# Patient Record
Sex: Female | Born: 1970 | Race: White | Hispanic: No | Smoking: Never smoker
Health system: Southern US, Community
[De-identification: ages and names within clinical notes are randomized; demographics above are authoritative.]

## PROBLEM LIST (undated history)

## (undated) DIAGNOSIS — F419 Anxiety disorder, unspecified: Secondary | ICD-10-CM

## (undated) DIAGNOSIS — N920 Excessive and frequent menstruation with regular cycle: Secondary | ICD-10-CM

## (undated) DIAGNOSIS — E119 Type 2 diabetes mellitus without complications: Secondary | ICD-10-CM

## (undated) DIAGNOSIS — R42 Dizziness and giddiness: Secondary | ICD-10-CM

## (undated) DIAGNOSIS — N946 Dysmenorrhea, unspecified: Secondary | ICD-10-CM

## (undated) DIAGNOSIS — E039 Hypothyroidism, unspecified: Secondary | ICD-10-CM

## (undated) DIAGNOSIS — I10 Essential (primary) hypertension: Secondary | ICD-10-CM

## (undated) HISTORY — PX: MASTOID DEBRIDEMENT: SHX2002

## (undated) HISTORY — DX: Type 2 diabetes mellitus without complications: E11.9

## (undated) HISTORY — PX: WISDOM TOOTH EXTRACTION: SHX21

## (undated) HISTORY — DX: Morbid (severe) obesity due to excess calories: E66.01

## (undated) HISTORY — DX: Dizziness and giddiness: R42

## (undated) HISTORY — DX: Dysmenorrhea, unspecified: N94.6

## (undated) HISTORY — DX: Hypothyroidism, unspecified: E03.9

## (undated) HISTORY — DX: Essential (primary) hypertension: I10

## (undated) HISTORY — DX: Excessive and frequent menstruation with regular cycle: N92.0

## (undated) HISTORY — PX: ABDOMINAL HYSTERECTOMY: SHX81

## (undated) HISTORY — DX: Anxiety disorder, unspecified: F41.9

---
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
## 1999-07-18 HISTORY — PX: TUBAL LIGATION: SHX77

## 2010-08-24 HISTORY — PX: CRYOABLATION: SHX1415

## 2011-04-06 ENCOUNTER — Ambulatory Visit: Payer: Self-pay | Admitting: Obstetrics & Gynecology

## 2011-04-13 ENCOUNTER — Ambulatory Visit: Payer: Self-pay | Admitting: Obstetrics & Gynecology

## 2011-04-13 HISTORY — PX: OTHER SURGICAL HISTORY: SHX169

## 2011-04-13 HISTORY — PX: OVARIAN CYST REMOVAL: SHX89

## 2011-04-17 LAB — PATHOLOGY REPORT

## 2014-10-08 LAB — HM PAP SMEAR: HM PAP: NEGATIVE

## 2015-05-05 ENCOUNTER — Encounter: Payer: Self-pay | Admitting: Physician Assistant

## 2015-05-05 ENCOUNTER — Ambulatory Visit (INDEPENDENT_AMBULATORY_CARE_PROVIDER_SITE_OTHER): Payer: BC Managed Care – PPO | Admitting: Physician Assistant

## 2015-05-05 VITALS — BP 100/76 | HR 81 | Temp 97.8°F | Resp 16 | Wt 194.6 lb

## 2015-05-05 DIAGNOSIS — Z833 Family history of diabetes mellitus: Secondary | ICD-10-CM

## 2015-05-05 DIAGNOSIS — M6752 Plica syndrome, left knee: Secondary | ICD-10-CM | POA: Diagnosis not present

## 2015-05-05 DIAGNOSIS — E789 Disorder of lipoprotein metabolism, unspecified: Secondary | ICD-10-CM

## 2015-05-05 DIAGNOSIS — I10 Essential (primary) hypertension: Secondary | ICD-10-CM | POA: Insufficient documentation

## 2015-05-05 DIAGNOSIS — R030 Elevated blood-pressure reading, without diagnosis of hypertension: Secondary | ICD-10-CM | POA: Diagnosis not present

## 2015-05-05 DIAGNOSIS — E1159 Type 2 diabetes mellitus with other circulatory complications: Secondary | ICD-10-CM | POA: Insufficient documentation

## 2015-05-05 MED ORDER — HYDROCHLOROTHIAZIDE 12.5 MG PO CAPS: 12.5000 mg | ORAL_CAPSULE | Freq: Every day | ORAL | Status: DC

## 2015-05-05 NOTE — Patient Instructions (Signed)
Hypertension Hypertension, commonly called high blood pressure, is when the force of blood pumping through your arteries is too strong. Your arteries are the blood vessels that carry blood from your heart throughout your body. A blood pressure reading consists of a higher number over a lower number, such as 110/72. The higher number (systolic) is the pressure inside your arteries when your heart pumps. The lower number (diastolic) is the pressure inside your arteries when your heart relaxes. Ideally you want your blood pressure below 120/80. Hypertension forces your heart to work harder to pump blood. Your arteries may become narrow or stiff. Having untreated or uncontrolled hypertension can cause heart attack, stroke, kidney disease, and other problems. RISK FACTORS Some risk factors for high blood pressure are controllable. Others are not.  Risk factors you cannot control include:   Race. You may be at higher risk if you are African American.  Age. Risk increases with age.  Gender. Men are at higher risk than women before age 45 years. After age 65, women are at higher risk than men. Risk factors you can control include:  Not getting enough exercise or physical activity.  Being overweight.  Getting too much fat, sugar, calories, or salt in your diet.  Drinking too much alcohol. SIGNS AND SYMPTOMS Hypertension does not usually cause signs or symptoms. Extremely high blood pressure (hypertensive crisis) may cause headache, anxiety, shortness of breath, and nosebleed. DIAGNOSIS To check if you have hypertension, your health care provider will measure your blood pressure while you are seated, with your arm held at the level of your heart. It should be measured at least twice using the same arm. Certain conditions can cause a difference in blood pressure between your right and left arms. A blood pressure reading that is higher than normal on one occasion does not mean that you need treatment. If  it is not clear whether you have high blood pressure, you may be asked to return on a different day to have your blood pressure checked again. Or, you may be asked to monitor your blood pressure at home for 1 or more weeks. TREATMENT Treating high blood pressure includes making lifestyle changes and possibly taking medicine. Living a healthy lifestyle can help lower high blood pressure. You may need to change some of your habits. Lifestyle changes may include:  Following the DASH diet. This diet is high in fruits, vegetables, and whole grains. It is low in salt, red meat, and added sugars.  Keep your sodium intake below 2,300 mg per day.  Getting at least 30-45 minutes of aerobic exercise at least 4 times per week.  Losing weight if necessary.  Not smoking.  Limiting alcoholic beverages.  Learning ways to reduce stress. Your health care provider may prescribe medicine if lifestyle changes are not enough to get your blood pressure under control, and if one of the following is true:  You are 18-59 years of age and your systolic blood pressure is above 140.  You are 60 years of age or older, and your systolic blood pressure is above 150.  Your diastolic blood pressure is above 90.  You have diabetes, and your systolic blood pressure is over 140 or your diastolic blood pressure is over 90.  You have kidney disease and your blood pressure is above 140/90.  You have heart disease and your blood pressure is above 140/90. Your personal target blood pressure may vary depending on your medical conditions, your age, and other factors. HOME CARE INSTRUCTIONS    Have your blood pressure rechecked as directed by your health care provider.   Take medicines only as directed by your health care provider. Follow the directions carefully. Blood pressure medicines must be taken as prescribed. The medicine does not work as well when you skip doses. Skipping doses also puts you at risk for  problems.  Do not smoke.   Monitor your blood pressure at home as directed by your health care provider. SEEK MEDICAL CARE IF:   You think you are having a reaction to medicines taken.  You have recurrent headaches or feel dizzy.  You have swelling in your ankles.  You have trouble with your vision. SEEK IMMEDIATE MEDICAL CARE IF:  You develop a severe headache or confusion.  You have unusual weakness, numbness, or feel faint.  You have severe chest or abdominal pain.  You vomit repeatedly.  You have trouble breathing. MAKE SURE YOU:   Understand these instructions.  Will watch your condition.  Will get help right away if you are not doing well or get worse.   This information is not intended to replace advice given to you by your health care provider. Make sure you discuss any questions you have with your health care provider.   Document Released: 07/03/2005 Document Revised: 11/17/2014 Document Reviewed: 04/25/2013 Elsevier Interactive Patient Education 2016 [employer]  Plica Syndrome With Rehab Plicae exist in approximately 60% of adults. They are folds in the joint lining (synovial tissue) that are remnant from development of the joint. These folds occur most commonly in the knee. Most individuals do not experience any adverse symptoms due to the presence of a plica. If a plica becomes thickened or inflamed, then it may cause symptoms. SYMPTOMS   Pain in the knee joint, usually greatest in the front (anterior) portion.  Pain that worsens with kneeling, squatting, or walking up/down stairs.  Catching, locking, and/or clicking of the knee. CAUSES  You are born with them (congenital). Plica syndrome is caused by irritation to the plicae. Irritation may be from injury or overuse in sports.  RISK INCREASES WITH:   Activities that include repetitive stress placed on the knee (kicking or jumping).  Previous knee injury.  Activities in which trauma to the knee  is likely (volleyball, soccer, or football). PREVENTION  Use properly fitted and padded protective equipment.  Warm up and stretch properly before activity.  Allow for adequate recovery between workouts.  Maintain physical fitness:  Strength, flexibility, and endurance.  Cardiovascular fitness. PROGNOSIS  If treated properly, then the symptoms of plica syndrome usually resolve. RELATED COMPLICATIONS   Recurrent symptoms that result in a chronic problem.  Inability to compete in athletics.  Prolonged healing time, if improperly treated.  Risks of surgery: infection, bleeding, nerve damage, or damage to surrounding tissues. TREATMENT Treatment initially involves the use of ice and medication to help reduce pain and inflammation. The use of strengthening and stretching exercises may help reduce pain with activity, specifically exercises involving the hamstring and quadriceps muscles. These exercises may be performed at home or with referral to a therapist. Your caregiver may recommend a corticosteroid injection to help reduce inflammation. For individuals with flat feet, an arch support may be recommended. If pain persists for greater than 6 months of nonsurgical (conservative) treatment, then surgery may be recommended to remove the plica.  MEDICATION   If pain medication is necessary, then nonsteroidal anti-inflammatory medications, such as aspirin and ibuprofen, or other minor pain relievers, such as acetaminophen, are often recommended.  Do not take pain medication for 7 days before surgery.  Prescription pain relievers may be given if deemed necessary by your caregiver. Use only as directed and only as much as you need.  Corticosteroid injections may be given by your caregiver. These injections should be reserved for the most serious cases because they may only be given a certain number of times. HEAT AND COLD  Cold treatment (icing) relieves pain and reduces inflammation.  Cold treatment should be applied for 10 to 15 minutes every 2 to 3 hours for inflammation and pain and immediately after any activity that aggravates your symptoms. Use ice packs or massage the area with a piece of ice (ice massage).  Heat treatment may be used prior to performing the stretching and strengthening activities prescribed by your caregiver, physical therapist, or athletic trainer. Use a heat pack or soak the injury in warm water. SEEK IMMEDIATE MEDICAL CARE IF:  Treatment seems to offer no benefit, or the condition worsens.  Any medications produce adverse side effects. EXERCISES RANGE OF MOTION (ROM) AND STRETCHING EXERCISES - Plica Syndrome These exercises may help you when beginning to rehabilitate your injury. Your symptoms may resolve with or without further involvement from your physician, physical therapist or athletic trainer. While completing these exercises, remember:   Restoring tissue flexibility helps normal motion to return to the joints. This allows healthier, less painful movement and activity.  An effective stretch should be held for at least 30 seconds.  A stretch should never be painful. You should only feel a gentle lengthening or release in the stretched tissue. STRETCH - Quadriceps, Prone  Lie on your stomach on a firm surface, such as a bed or padded floor.  Bend your right / left knee and grasp your ankle. If you are unable to reach, your ankle or pant leg, use a belt around your foot to lengthen your reach.  Gently pull your heel toward your buttocks. Your knee should not slide out to the side. You should feel a stretch in the front of your thigh and/or knee.  Hold this position for __________ seconds. Repeat __________ times. Complete this stretch __________ times per day.  STRETCH - Hamstrings, Supine   Lie on your back. Loop a belt or towel over the ball of your right / left foot.  Straighten your right / left knee and slowly pull on the belt to  raise your leg. Do not allow the right / left knee to bend. Keep your opposite leg flat on the floor.  Raise the leg until you feel a gentle stretch behind your right / left knee or thigh. Hold this position for __________ seconds. Repeat __________ times. Complete this stretch __________ times per day.  STRETCH - Hamstrings, Doorway  Lie on your back with your right / left leg extended and resting on the wall and the opposite leg flat on the ground through the door. Initially, position your bottom farther away from the wall.  Keep your right / left knee straight. If you feel a stretch behind your knee or thigh, hold this position for __________ seconds.  If you do not feel a stretch, scoot your bottom closer to the door and hold __________ seconds. Repeat __________ times. Complete this stretch __________ times per day.  STRETCH - Iliotibial Band  On the floor or bed, lie on your side so your right / left leg is on top. Bend your knee and grab your ankle.  Slowly bring your knee back so  that your thigh is in line with your trunk. Keep your heel at your buttocks and gently arch your back so your head, shoulders and hips line up.  Slowly lower your leg so that your knee approaches the floor/bed until you feel a gentle stretch on the outside of your right / left thigh. If you do not feel a stretch and your knee will not fall farther, place the heel of your opposite foot on top of your knee and pull your thigh down farther.  Hold this stretch for __________ seconds. Repeat __________ times. Complete this stretch __________ times per day. STRETCH - Hamstrings, Standing  Stand or sit and extend your right / left leg, placing your foot on a chair or foot stool  Keeping a slight arch in your low back and your hips straight forward.  Lead with your chest and lean forward at the waist until you feel a gentle stretch in the back of your right / left knee or thigh. (When done correctly, this  exercise requires leaning only a small distance.)  Hold this position for __________ seconds. Repeat __________ times. Complete this stretch __________ times per day. STRENGTHENING EXERCISES - Plica Syndrome  These exercises may help you when beginning to rehabilitate your injury. They may resolve your symptoms with or without further involvement from your physician, physical therapist or athletic trainer. While completing these exercises, remember:   Muscles can gain both the endurance and the strength needed for everyday activities through controlled exercises.  Complete these exercises as instructed by your physician, physical therapist or athletic trainer. Progress the resistance and repetitions only as guided. STRENGTH - Quadriceps, Isometrics  Lie on your back with your right / left leg extended and your opposite knee bent.  Gradually tense the muscles in the front of your right / left thigh. You should see either your knee cap slide up toward your hip or increased dimpling just above the knee. This motion will push the back of the knee down toward the floor/mat/bed on which you are lying.  Hold the muscle as tight as you can without increasing your pain for __________ seconds.  Relax the muscles slowly and completely in between each repetition. Repeat __________ times. Complete this exercise __________ times per day.  STRENGTH - Quadriceps, Short Arcs   Lie on your back. Place a __________ inch towel roll under your knee so that the knee slightly bends.  Raise only your lower leg by tightening the muscles in the front of your thigh. Do not allow your thigh to rise.  Hold this position for __________ seconds. Repeat __________ times. Complete this exercise __________ times per day.  OPTIONAL ANKLE WEIGHTS: Begin with ____________________, but DO NOT exceed ____________________. Increase in 1 lb/0.5 kg increments. STRENGTH - Quadriceps, Straight Leg Raises  Quality counts! Watch  for signs that the quadriceps muscle is working to insure you are strengthening the correct muscles and not "cheating" by substituting with healthier muscles.  Lay on your back with your right / left leg extended and your opposite knee bent.  Tense the muscles in the front of your right / left thigh. You should see either your knee cap slide up or increased dimpling just above the knee. Your thigh may even quiver.  Tighten these muscles even more and raise your leg 4 to 6 inches off the floor. Hold for __________ seconds.  Keeping these muscles tense, lower your leg.  Relax the muscles slowly and completely in between each repetition. Repeat  __________ times. Complete this exercise __________ times per day.  STRENGTH - Quadriceps, Step-Ups   Use a thick book, step or step stool that is __________ inches tall.  Holding a wall or counter for balance only, not support.  Slowly step-up with your right / left foot, keeping your knee in line with your hip and foot. Do not allow your knee to bend so far that you cannot see your toes.  Slowly unlock your knee and lower yourself to the starting position. Your muscles, not gravity, should lower you. Repeat __________ times. Complete this exercise __________ times per day.  STRENGTH - Quad/VMO, Isometric   Sit in a chair with your right / left knee slightly bent. With your fingertips, feel the VMO muscle just above the inside of your knee. The VMO is important in controlling the position of your kneecap.  Keeping your fingertips on this muscle. Without actually moving your leg, attempt to drive your knee down as if straightening your leg. You should feel your VMO tense. If you have a difficult time, you may wish to try the same exercise on your healthy knee first.  Tense this muscle as hard as you can without increasing any knee pain.  Hold for __________ seconds. Relax the muscles slowly and completely in between each repetition. Repeat  __________ times. Complete exercise __________ times per day.    This information is not intended to replace advice given to you by your health care provider. Make sure you discuss any questions you have with your health care provider.   Document Released: 07/03/2005 Document Revised: 11/17/2014 Document Reviewed: 10/15/2008 Elsevier Interactive Patient Education 2016 Elsevier Inc. * Hold all stretches for 15 seconds and perform exercises 3 sets of 10-15.*

## 2015-05-05 NOTE — Progress Notes (Signed)
Patient: Tammie Bush Female    DOB: Apr 08, 1971   44 y.o.   MRN: [id number] Visit Date: 05/05/2015  Today's Provider: Margaretann LovelessJennifer M Burnette, PA-C   Chief Complaint  Patient presents with  . Establish Care   Subjective:    HPI  Establish Care with New Provider: [name] is a 44 year old new patient to establish care. [facility] as primary Care physicians. Last provider was [facility]. Patient had a Wellness with [facility] and went to see Dr. Lacretia Leighobert Bush for Mammogram and pap smear [facility]. Patient wants blood work done today. Declined Influenza vaccine.  She was told at one visit that her blood pressure was very elevated. She is never had issues with her blood pressure before. She was started on lisinopril HCTZ 10-12.5mg . She has been taking that regularly since then. She has had no side effects from the medication and reports good compliance.  Last Pap Smear:07/2014 Mammogram:07/2014      No Known Allergies Previous Medications   LISINOPRIL-HYDROCHLOROTHIAZIDE (PRINZIDE,ZESTORETIC) 10-12.5 MG TABLET    TAKE 1 TABLET BY ORAL ROUTE ONCE DAILY    Review of Systems  Constitutional: Negative.   HENT: Negative.   Eyes: Negative.   Respiratory: Negative.   Cardiovascular: Negative.   Gastrointestinal: Negative.   Endocrine: Negative.   Genitourinary: Negative.   Musculoskeletal: Negative.   Skin: Negative.   Allergic/Immunologic: Negative.   Neurological: Negative.   Hematological: Negative.   Psychiatric/Behavioral: Negative.     Social History  Substance Use Topics  . Smoking status: Never Smoker   . Smokeless tobacco: Not on file  . Alcohol Use: No   Objective:   BP 100/76 mmHg  Pulse 81  Temp(Src) 97.8 F (36.6 C) (Oral)  Resp 16  Wt 194 lb 9.6 oz (88.27 kg)  Physical Exam  Constitutional: She is oriented to person, place, and time. She appears well-developed and well-nourished. No distress.    HENT:  Head: Normocephalic and atraumatic.  Right Ear: Hearing, tympanic membrane, external ear and ear canal normal.  Left Ear: Hearing, tympanic membrane, external ear and ear canal normal.  Nose: Nose normal.  Mouth/Throat: Uvula is midline, oropharynx is clear and moist and mucous membranes are normal. Dental caries present. No oropharyngeal exudate.  Eyes: Conjunctivae and EOM are normal. Pupils are equal, round, and reactive to light. Right eye exhibits no discharge. Left eye exhibits no discharge. No scleral icterus.  Neck: Normal range of motion. Neck supple. No JVD present. No tracheal deviation present. No thyromegaly present.  Cardiovascular: Normal rate, regular rhythm, normal heart sounds and intact distal pulses.  Exam reveals no gallop and no friction rub.   No murmur heard. Pulmonary/Chest: Effort normal and breath sounds normal. No respiratory distress. She has no wheezes. She has no rales.  Abdominal: Soft. Bowel sounds are normal. She exhibits no distension and no mass. There is no tenderness. There is no rebound and no guarding.  Musculoskeletal: Normal range of motion. She exhibits no edema.       Right knee: Normal.       Left knee: She exhibits swelling (mild). She exhibits normal range of motion, no effusion, no ecchymosis, no deformity, no laceration, no erythema, normal alignment, no LCL laxity, normal patellar mobility, no bony tenderness, normal meniscus and no MCL laxity. Tenderness found. Medial joint line (over medial plica) tenderness noted. No lateral joint line, no MCL, no LCL and no patellar  tendon tenderness noted.  Lymphadenopathy:    She has no cervical adenopathy.  Neurological: She is alert and oriented to person, place, and time.  Skin: Skin is warm and dry. No rash noted. She is not diaphoretic.  Psychiatric: She has a normal mood and affect. Her behavior is normal. Judgment and thought content normal.  Vitals reviewed.       Assessment & Plan:      1. Borderline blood pressure We will check labs as below. We will follow-up pending lab results. I also we'll discontinue her lisinopril since her blood pressure was slightly low today in the office. We will continue the hydrochlorothiazide 12.5 mg. I will have her return in 4 weeks so that we may recheck her blood pressure. - TSH - CBC with Differential - Comprehensive Metabolic Panel (CMET) - hydrochlorothiazide (MICROZIDE) 12.5 MG capsule; Take 1 capsule (12.5 mg total) by mouth daily.  Dispense: 30 capsule; Refill: 6  2. Borderline high cholesterol She does have a history of borderline high cholesterol. She has not had her lipid panel checked in many years. We will recheck that and follow-up pending lab results. - Lipid panel  3. Family history of diabetes mellitus She does have a strong family history of diabetes in her mother and her sister. I will check her hemoglobin A1c and follow-up pending results. - HgB A1c  4. Plica syndrome of knee, left She also discussed airing the visit some left knee pain that started randomly. She states that it has gotten better with taking Aleve. She is taking Aleve 1 pill twice daily over-the-counter. Upon evaluation of the left knee she has no limited range of motion just some tenderness along medial joint line right over the medial plica. I did give her some exercises for plica syndrome and advised her to keep taking the Aleve for the next couple weeks.       Margaretann Loveless, PA-C  [facility]

## 2015-05-06 ENCOUNTER — Telehealth: Payer: Self-pay

## 2015-05-06 LAB — CBC WITH DIFFERENTIAL/PLATELET
BASOS ABS: 0 10*3/uL (ref 0.0–0.2)
Basos: 0 %
EOS (ABSOLUTE): 0.3 10*3/uL (ref 0.0–0.4)
EOS: 3 %
HEMATOCRIT: 43.9 % (ref 34.0–46.6)
HEMOGLOBIN: 15.2 g/dL (ref 11.1–15.9)
IMMATURE GRANS (ABS): 0 10*3/uL (ref 0.0–0.1)
IMMATURE GRANULOCYTES: 0 %
LYMPHS: 38 %
Lymphocytes Absolute: 4.3 10*3/uL — ABNORMAL HIGH (ref 0.7–3.1)
MCH: 29.5 pg (ref 26.6–33.0)
MCHC: 34.6 g/dL (ref 31.5–35.7)
MCV: 85 fL (ref 79–97)
Monocytes Absolute: 0.7 10*3/uL (ref 0.1–0.9)
Monocytes: 7 %
Neutrophils Absolute: 5.9 10*3/uL (ref 1.4–7.0)
Neutrophils: 52 %
Platelets: 421 10*3/uL — ABNORMAL HIGH (ref 150–379)
RBC: 5.16 x10E6/uL (ref 3.77–5.28)
RDW: 13.1 % (ref 12.3–15.4)
WBC: 11.4 10*3/uL — AB (ref 3.4–10.8)

## 2015-05-06 LAB — COMPREHENSIVE METABOLIC PANEL
ALBUMIN: 4.7 g/dL (ref 3.5–5.5)
ALT: 13 IU/L (ref 0–32)
AST: 16 IU/L (ref 0–40)
Albumin/Globulin Ratio: 1.6 (ref 1.1–2.5)
Alkaline Phosphatase: 74 IU/L (ref 39–117)
BUN / CREAT RATIO: 25 — AB (ref 9–23)
BUN: 23 mg/dL (ref 6–24)
Bilirubin Total: 0.4 mg/dL (ref 0.0–1.2)
CALCIUM: 9.5 mg/dL (ref 8.7–10.2)
CO2: 24 mmol/L (ref 18–29)
CREATININE: 0.91 mg/dL (ref 0.57–1.00)
Chloride: 95 mmol/L — ABNORMAL LOW (ref 97–106)
GFR calc Af Amer: 89 mL/min/{1.73_m2} (ref >59)
GFR, EST NON AFRICAN AMERICAN: 77 mL/min/{1.73_m2} (ref >59)
GLOBULIN, TOTAL: 2.9 g/dL (ref 1.5–4.5)
GLUCOSE: 106 mg/dL — AB (ref 65–99)
Potassium: 3.9 mmol/L (ref 3.5–5.2)
Sodium: 137 mmol/L (ref 136–144)
TOTAL PROTEIN: 7.6 g/dL (ref 6.0–8.5)

## 2015-05-06 LAB — LIPID PANEL
CHOL/HDL RATIO: 4.4 ratio (ref 0.0–4.4)
Cholesterol, Total: 217 mg/dL — ABNORMAL HIGH (ref 100–199)
HDL: 49 mg/dL (ref >39)
LDL CALC: 146 mg/dL — AB (ref 0–99)
Triglycerides: 109 mg/dL (ref 0–149)
VLDL Cholesterol Cal: 22 mg/dL (ref 5–40)

## 2015-05-06 LAB — HEMOGLOBIN A1C
ESTIMATED AVERAGE GLUCOSE: 128 mg/dL
Hgb A1c MFr Bld: 6.1 % — ABNORMAL HIGH (ref 4.8–5.6)

## 2015-05-06 LAB — TSH: TSH: 4.35 u[IU]/mL (ref 0.450–4.500)

## 2015-05-06 NOTE — Telephone Encounter (Signed)
Advised patient as below. Patient reports that she will keep her F/U appt in 1 month.

## 2015-05-06 NOTE — Telephone Encounter (Signed)
-----   Message from Margaretann LovelessJennifer M Burnette, [place] sent at 05/06/2015  1:25 PM EDT ----- HgBA1c is slightly elevated at 6.1.  This indicates pre-diabetes.  With your family history it is important to really start watching your diet and adding daily exercise.  A simple rule to follow is avoid or limit anything white (white rice, white potato, white bread, white sugar).  Your cholesterol was also elevated as well.  Making these lifestyle changes should help to lower that as well.  You can also add fish oil 1200 mg BID to help lower cholesterol as well.  All other labs were WNL.

## 2015-05-06 NOTE — Telephone Encounter (Signed)
LMTCB 05/06/15  Thanks,  -Denilson Salminen

## 2015-05-12 ENCOUNTER — Encounter: Payer: Self-pay | Admitting: Physician Assistant

## 2015-06-02 ENCOUNTER — Encounter: Payer: Self-pay | Admitting: Physician Assistant

## 2015-06-02 ENCOUNTER — Ambulatory Visit (INDEPENDENT_AMBULATORY_CARE_PROVIDER_SITE_OTHER): Payer: BC Managed Care – PPO | Admitting: Physician Assistant

## 2015-06-02 VITALS — BP 120/80 | HR 93 | Temp 98.5°F | Resp 16 | Wt 194.0 lb

## 2015-06-02 DIAGNOSIS — E7800 Pure hypercholesterolemia, unspecified: Secondary | ICD-10-CM | POA: Insufficient documentation

## 2015-06-02 DIAGNOSIS — R030 Elevated blood-pressure reading, without diagnosis of hypertension: Secondary | ICD-10-CM

## 2015-06-02 DIAGNOSIS — R7309 Other abnormal glucose: Secondary | ICD-10-CM | POA: Diagnosis not present

## 2015-06-02 DIAGNOSIS — E119 Type 2 diabetes mellitus without complications: Secondary | ICD-10-CM | POA: Insufficient documentation

## 2015-06-02 MED ORDER — HYDROCHLOROTHIAZIDE 12.5 MG PO CAPS: 12.5000 mg | ORAL_CAPSULE | Freq: Every day | ORAL | Status: DC

## 2015-06-02 NOTE — Progress Notes (Signed)
Patient: Tammie Bush Female    DOB: 1970-08-22   44 y.o.   MRN: [id number] Visit Date: 06/02/2015  Today's Provider: Margaretann LovelessJennifer M Gwenda Heiner, PA-C   Chief Complaint  Patient presents with  . Follow-up    Hypertension   Subjective:    HPI Hypertension: Patient is here for evaluation of elevated blood pressures. Last office visit patients blood pressure was 100/76 Today it is 120/80. Patient had labs done at last office visit which were abnormal patient wants to talk to provider about her blood sugar results. Patient denies chest pain,Leg pain,headaches, shortness of breath and fatigue.Use of agents associated with hypertension: none. History of target organ damage: none. Patient is exercising 3 times per week, brisk walk. Per patient is trying to maintained a heathy diet which consist of no salt.      Lab Results  Component Value Date   WBC 11.4* 05/05/2015   HCT 43.9 05/05/2015   GLUCOSE 106* 05/05/2015   CHOL 217* 05/05/2015   TRIG 109 05/05/2015   HDL 49 05/05/2015   LDLCALC 146* 05/05/2015   ALT 13 05/05/2015   AST 16 05/05/2015   NA 137 05/05/2015   K 3.9 05/05/2015   CL 95* 05/05/2015   CREATININE 0.91 05/05/2015   BUN 23 05/05/2015   CO2 24 05/05/2015   TSH 4.350 05/05/2015   HGBA1C 6.1* 05/05/2015     No Known Allergies Previous Medications   HYDROCHLOROTHIAZIDE (MICROZIDE) 12.5 MG CAPSULE    Take 1 capsule (12.5 mg total) by mouth daily.   OMEGA-3 FATTY ACIDS (FISH OIL) 1200 MG CAPS    Take 1 capsule by mouth daily.    Review of Systems  Constitutional: Negative for fatigue.  Respiratory: Negative for shortness of breath.   Cardiovascular: Negative for chest pain and leg swelling.  Gastrointestinal: Negative.   Neurological: Negative for headaches.  All other systems reviewed and are negative.   Social History  Substance Use Topics  . Smoking status: Never Smoker   . Smokeless tobacco: Not on file  . Alcohol Use: No   Objective:   BP  120/80 mmHg  Pulse 93  Temp(Src) 98.5 F (36.9 C) (Oral)  Resp 16  Wt 194 lb (87.998 kg)  Physical Exam  Constitutional: She appears well-developed and well-nourished. No distress.  Cardiovascular: Normal rate, regular rhythm and normal heart sounds.  Exam reveals no gallop and no friction rub.   No murmur heard. Pulmonary/Chest: Effort normal and breath sounds normal. No respiratory distress. She has no wheezes. She has no rales.  Musculoskeletal: She exhibits no edema.  Skin: She is not diaphoretic.  Vitals reviewed.       Assessment & Plan:     1. Borderline blood pressure Blood pressure is stable with discontinuing the lisinopril. She will continue to take the hydrochlorothiazide as directed below. I will see her back in approximately 3 months in March 2017. We will recheck her blood pressure and make sure that she is still stable. We will also check labs at that point to see if she has had improvement in the labs with her lifestyle changes. She is to call the office if she has any acute issues, questions or concerns. - hydrochlorothiazide (MICROZIDE) 12.5 MG capsule; Take 1 capsule (12.5 mg total) by mouth daily.  Dispense: 30 capsule; Refill: 6  2. Elevated hemoglobin A1c She is trying to make lifestyle changes including exercise and a heart healthy diet. She is taking fish oil for  her cholesterol and has been limiting carbohydrates in her diet for her elevated hemoglobin A1c level. I will see her back in 3 months, March 2017 to recheck her labs at that time. She may call the office if she has any questions or concerns.       Margaretann Loveless, PA-C  [facility]

## 2015-06-02 NOTE — Patient Instructions (Signed)
Diabetes Mellitus and Food It is important for you to manage your blood sugar (glucose) level. Your blood glucose level can be greatly affected by what you eat. Eating healthier foods in the appropriate amounts throughout the day at about the same time each day will help you control your blood glucose level. It can also help slow or prevent worsening of your diabetes mellitus. Healthy eating may even help you improve the level of your blood pressure and reach or maintain a healthy weight.  General recommendations for healthful eating and cooking habits include:  Eating meals and snacks regularly. Avoid going long periods of time without eating to lose weight.  Eating a diet that consists mainly of plant-based foods, such as fruits, vegetables, nuts, legumes, and whole grains.  Using low-heat cooking methods, such as baking, instead of high-heat cooking methods, such as deep frying. Work with your dietitian to make sure you understand how to use the Nutrition Facts information on food labels. HOW CAN FOOD AFFECT ME? Carbohydrates Carbohydrates affect your blood glucose level more than any other type of food. Your dietitian will help you determine how many carbohydrates to eat at each meal and teach you how to count carbohydrates. Counting carbohydrates is important to keep your blood glucose at a healthy level, especially if you are using insulin or taking certain medicines for diabetes mellitus. Alcohol Alcohol can cause sudden decreases in blood glucose (hypoglycemia), especially if you use insulin or take certain medicines for diabetes mellitus. Hypoglycemia can be a life-threatening condition. Symptoms of hypoglycemia (sleepiness, dizziness, and disorientation) are similar to symptoms of having too much alcohol.  If your health care provider has given you approval to drink alcohol, do so in moderation and use the following guidelines:  Women should not have more than one drink per day, and men  should not have more than two drinks per day. One drink is equal to:  12 oz of beer.  5 oz of wine.  1 oz of hard liquor.  Do not drink on an empty stomach.  Keep yourself hydrated. Have water, diet soda, or unsweetened iced tea.  Regular soda, juice, and other mixers might contain a lot of carbohydrates and should be counted. WHAT FOODS ARE NOT RECOMMENDED? As you make food choices, it is important to remember that all foods are not the same. Some foods have fewer nutrients per serving than other foods, even though they might have the same number of calories or carbohydrates. It is difficult to get your body what it needs when you eat foods with fewer nutrients. Examples of foods that you should avoid that are high in calories and carbohydrates but low in nutrients include:  Trans fats (most processed foods list trans fats on the Nutrition Facts label).  Regular soda.  Juice.  Candy.  Sweets, such as cake, pie, doughnuts, and cookies.  Fried foods. WHAT FOODS CAN I EAT? Eat nutrient-rich foods, which will nourish your body and keep you healthy. The food you should eat also will depend on several factors, including:  The calories you need.  The medicines you take.  Your weight.  Your blood glucose level.  Your blood pressure level.  Your cholesterol level. You should eat a variety of foods, including:  Protein.  Lean cuts of meat.  Proteins low in saturated fats, such as fish, egg whites, and beans. Avoid processed meats.  Fruits and vegetables.  Fruits and vegetables that may help control blood glucose levels, such as apples, mangoes, and   yams.  Dairy products.  Choose fat-free or low-fat dairy products, such as milk, yogurt, and cheese.  Grains, bread, pasta, and rice.  Choose whole grain products, such as multigrain bread, whole oats, and brown rice. These foods may help control blood pressure.  Fats.  Foods containing healthful fats, such as nuts,  avocado, olive oil, canola oil, and fish. DOES EVERYONE WITH DIABETES MELLITUS HAVE THE SAME MEAL PLAN? Because every person with diabetes mellitus is different, there is not one meal plan that works for everyone. It is very important that you meet with a dietitian who will help you create a meal plan that is just right for you.   This information is not intended to replace advice given to you by your health care provider. Make sure you discuss any questions you have with your health care provider.   Document Released: 03/30/2005 Document Revised: 07/24/2014 Document Reviewed: 05/30/2013 Elsevier Interactive Patient Education 2016 Elsevier Inc.  

## 2015-08-16 ENCOUNTER — Ambulatory Visit (INDEPENDENT_AMBULATORY_CARE_PROVIDER_SITE_OTHER): Payer: BC Managed Care – PPO | Admitting: Family Medicine

## 2015-08-16 ENCOUNTER — Encounter: Payer: Self-pay | Admitting: Family Medicine

## 2015-08-16 ENCOUNTER — Encounter: Payer: Self-pay | Admitting: Physician Assistant

## 2015-08-16 VITALS — BP 148/90 | HR 74 | Temp 98.1°F | Resp 16 | Wt 189.8 lb

## 2015-08-16 DIAGNOSIS — B349 Viral infection, unspecified: Secondary | ICD-10-CM | POA: Diagnosis not present

## 2015-08-16 MED ORDER — PROMETHAZINE HCL 25 MG PO TABS: 25.0000 mg | ORAL_TABLET | Freq: Four times a day (QID) | ORAL | Status: DC | PRN

## 2015-08-16 NOTE — Patient Instructions (Signed)

## 2015-08-16 NOTE — Progress Notes (Signed)
Patient ID: Tammie Bush, female   DOB: [birth year], 45 y.o.   MRN: [id number]   Patient: Tammie Bush Female    DOB: September 15, 1970   45 y.o.   MRN: [id number] Visit Date: 08/16/2015  Today's Provider: Dortha Kern, PA   Chief Complaint  Patient presents with  . Dizziness   Subjective:    Dizziness This is a new problem. Episode onset: first episode was Saturday night. The problem occurs intermittently. Associated symptoms include chills, headaches, nausea, vertigo and vomiting. Pertinent negatives include no abdominal pain. Associated symptoms comments: Episodes of vomiting with dizziness and sweats with chills yesterday. Last episode this morning at 5:00 am. . Exacerbated by: movements feel lightheaded. She has tried lying down for the symptoms.   Patient Active Problem List   Diagnosis Date Noted  . Elevated hemoglobin A1c 06/02/2015  . Hypercholesterolemia 06/02/2015  . Borderline blood pressure 05/05/2015   Past Surgical History  Procedure Laterality Date  . Abdominal hysterectomy  2012  . Tonsillectomy  1977  . Right ear masstoide  1978    operation   History reviewed. No pertinent family history.   Previous Medications   HYDROCHLOROTHIAZIDE (MICROZIDE) 12.5 MG CAPSULE    Take 1 capsule (12.5 mg total) by mouth daily.   OMEGA-3 FATTY ACIDS (FISH OIL) 1200 MG CAPS    Take 1 capsule by mouth daily.   No Known Allergies   Review of Systems  Constitutional: Positive for chills.  Eyes: Negative.   Respiratory: Negative.   Cardiovascular: Negative.   Gastrointestinal: Positive for nausea and vomiting. Negative for abdominal pain.  Endocrine: Negative.   Genitourinary: Negative.   Musculoskeletal: Negative.   Skin: Negative.   Allergic/Immunologic: Negative.   Neurological: Positive for dizziness, vertigo and headaches.  Hematological: Negative.   Psychiatric/Behavioral: Negative.     Social History  Substance Use Topics  . Smoking status: Never Smoker   .  Smokeless tobacco: Not on file  . Alcohol Use: No   Objective:   BP 148/90 mmHg  Pulse 74  Temp(Src) 98.1 F (36.7 C) (Oral)  Resp 16  Wt 189 lb 12.8 oz (86.093 kg)  SpO2 97%  Physical Exam  Constitutional: She is oriented to person, place, and time. She appears well-developed and well-nourished.  HENT:  Head: Normocephalic.  Right Ear: External ear normal.  Left Ear: External ear normal.  Mouth/Throat: Oropharynx is clear and moist.  Eyes: Conjunctivae and EOM are normal.  Neck: Normal range of motion. Neck supple.  Cardiovascular: Normal rate and normal heart sounds.   Pulmonary/Chest: Effort normal and breath sounds normal.  Abdominal: Soft.  Quiet bowel sounds.  Neurological: She is alert and oriented to person, place, and time.      Assessment & Plan:     1. Viral syndrome Onset with light headed sensation and nausea with vomiting over the past 24 hours. Increase fluid intake and bland liquid diet - promethazine (PHENERGAN) 25 MG tablet; Take 1 tablet (25 mg total) by mouth every 6 (six) hours as needed for nausea or vomiting.  Dispense: 20 tablet; Refill: 0

## 2015-08-18 ENCOUNTER — Encounter: Payer: Self-pay | Admitting: Physician Assistant

## 2015-08-18 ENCOUNTER — Telehealth: Payer: Self-pay | Admitting: Physician Assistant

## 2015-08-18 ENCOUNTER — Ambulatory Visit (INDEPENDENT_AMBULATORY_CARE_PROVIDER_SITE_OTHER): Payer: BC Managed Care – PPO | Admitting: Physician Assistant

## 2015-08-18 VITALS — BP 136/92 | HR 70 | Temp 97.3°F | Resp 16 | Wt 191.6 lb

## 2015-08-18 DIAGNOSIS — R42 Dizziness and giddiness: Secondary | ICD-10-CM | POA: Diagnosis not present

## 2015-08-18 MED ORDER — MECLIZINE HCL 25 MG PO TABS: 25.0000 mg | ORAL_TABLET | Freq: Three times a day (TID) | ORAL | Status: DC | PRN

## 2015-08-18 NOTE — Patient Instructions (Signed)
Benign Positional Vertigo Vertigo is the feeling that you or your surroundings are moving when they are not. Benign positional vertigo is the most common form of vertigo. The cause of this condition is not serious (is benign). This condition is triggered by certain movements and positions (is positional). This condition can be dangerous if it occurs while you are doing something that could endanger you or others, such as driving.  CAUSES In many cases, the cause of this condition is not known. It may be caused by a disturbance in an area of the inner ear that helps your brain to sense movement and balance. This disturbance can be caused by a viral infection (labyrinthitis), head injury, or repetitive motion. RISK FACTORS This condition is more likely to develop in:  Women.  People who are 50 years of age or older. SYMPTOMS Symptoms of this condition usually happen when you move your head or your eyes in different directions. Symptoms may start suddenly, and they usually last for less than a minute. Symptoms may include:  Loss of balance and falling.  Feeling like you are spinning or moving.  Feeling like your surroundings are spinning or moving.  Nausea and vomiting.  Blurred vision.  Dizziness.  Involuntary eye movement (nystagmus). Symptoms can be mild and cause only slight annoyance, or they can be severe and interfere with daily life. Episodes of benign positional vertigo may return (recur) over time, and they may be triggered by certain movements. Symptoms may improve over time. DIAGNOSIS This condition is usually diagnosed by medical history and a physical exam of the head, neck, and ears. You may be referred to a health care provider who specializes in ear, nose, and throat (ENT) problems (otolaryngologist) or a provider who specializes in disorders of the nervous system (neurologist). You may have additional testing, including:  MRI.  A CT scan.  Eye movement tests. Your  health care provider may ask you to change positions quickly while he or she watches you for symptoms of benign positional vertigo, such as nystagmus. Eye movement may be tested with an electronystagmogram (ENG), caloric stimulation, the Dix-Hallpike test, or the roll test.  An electroencephalogram (EEG). This records electrical activity in your brain.  Hearing tests. TREATMENT Usually, your health care provider will treat this by moving your head in specific positions to adjust your inner ear back to normal. Surgery may be needed in severe cases, but this is rare. In some cases, benign positional vertigo may resolve on its own in 2-4 weeks. HOME CARE INSTRUCTIONS Safety  Move slowly.Avoid sudden body or head movements.  Avoid driving.  Avoid operating heavy machinery.  Avoid doing any tasks that would be dangerous to you or others if a vertigo episode would occur.  If you have trouble walking or keeping your balance, try using a cane for stability. If you feel dizzy or unstable, sit down right away.  Return to your normal activities as told by your health care provider. Ask your health care provider what activities are safe for you. General Instructions  Take over-the-counter and prescription medicines only as told by your health care provider.  Avoid certain positions or movements as told by your health care provider.  Drink enough fluid to keep your urine clear or pale yellow.  Keep all follow-up visits as told by your health care provider. This is important. SEEK MEDICAL CARE IF:  You have a fever.  Your condition gets worse or you develop new symptoms.  Your family or friends   notice any behavioral changes.  Your nausea or vomiting gets worse.  You have numbness or a "pins and needles" sensation. SEEK IMMEDIATE MEDICAL CARE IF:  You have difficulty speaking or moving.  You are always dizzy.  You faint.  You develop severe headaches.  You have weakness in your  legs or arms.  You have changes in your hearing or vision.  You develop a stiff neck.  You develop sensitivity to light.   This information is not intended to replace advice given to you by your health care provider. Make sure you discuss any questions you have with your health care provider.   Document Released: 04/10/2006 Document Revised: 03/24/2015 Document Reviewed: 10/26/2014 Elsevier Interactive Patient Education 2016 Elsevier Inc.  

## 2015-08-18 NOTE — Telephone Encounter (Signed)
Please advise.  Thanks,  -Jeffery Gammell 

## 2015-08-18 NOTE — Telephone Encounter (Signed)
Patient reports that she was seen by Maurine Minister on Monday (08/16/2015), and she reports that she now is experiencing Dizziness. She reports that she no longer has nausea and does not think she needs to take the medication that [facility] prescribed on Monday. She reports that her gait is off, and she can hardly lift her head without experiencing dizziness. She denies any blurred vision, but does have an associated headache. She also reports that she doesn't think that she will be able to work tomorrow. She is requesting that something be called in for dizziness. Patient uses [facility] in [place]. Thanks!

## 2015-08-18 NOTE — Telephone Encounter (Signed)
Pt has called back wanting to know what to do about her headache.  She said she cannot work .  Does not know what else to take.    Call back [phone].

## 2015-08-18 NOTE — Progress Notes (Signed)
Subjective:     Patient ID: Tammie Bush, female   DOB: July 15, 1971, 45 y.o.   MRN: [id number]  Migraine  This is a recurrent problem. The current episode started in the past 7 days. The problem occurs daily. The problem has been unchanged. The pain is located in the bilateral and occipital region. The pain does not radiate. The pain quality is not similar to prior headaches. The quality of the pain is described as aching. The pain is at a severity of 10/10. The pain is severe. Associated symptoms include blurred vision, dizziness, facial sweating, nausea, scalp tenderness, vomiting and weakness. Pertinent negatives include no abdominal pain, abnormal behavior, anorexia, back pain, coughing, drainage, ear pain, eye pain, eye redness, eye watering, fever, hearing loss, insomnia, loss of balance, muscle aches, neck pain, numbness, phonophobia, photophobia, rhinorrhea, seizures, sinus pressure, sore throat, swollen glands, tingling, tinnitus, visual change or weight loss. Nothing aggravates the symptoms. She has tried NSAIDs for the symptoms. The treatment provided no relief. (Borderline blood pressure)  Dizziness This is a recurrent problem. The current episode started in the past 7 days. The problem occurs constantly. The problem has been unchanged. Associated symptoms include chills, fatigue, headaches, nausea, vertigo, vomiting and weakness. Pertinent negatives include no abdominal pain, anorexia, arthralgias, congestion, coughing, diaphoresis, fever, joint swelling, myalgias, neck pain, numbness, rash, sore throat, swollen glands or visual change. Exacerbated by: onset with migraine. She has tried NSAIDs (phenergan) for the symptoms. The treatment provided mild relief.     Review of Systems  Constitutional: Positive for chills and fatigue. Negative for fever, weight loss and diaphoresis.  HENT: Negative for congestion, ear pain, hearing loss, rhinorrhea, sinus pressure, sore throat and tinnitus.    Eyes: Positive for blurred vision. Negative for photophobia, pain and redness.  Respiratory: Negative for cough.   Gastrointestinal: Positive for nausea and vomiting. Negative for abdominal pain and anorexia.  Musculoskeletal: Negative for myalgias, back pain, joint swelling, arthralgias and neck pain.  Skin: Negative for rash.  Neurological: Positive for dizziness, vertigo, weakness and headaches. Negative for tingling, seizures, numbness and loss of balance.  Psychiatric/Behavioral: The patient does not have insomnia.    Filed Vitals:   08/18/15 1628  BP: 136/92  Pulse: 70  Temp: 97.3 F (36.3 C)  Resp: 16      Objective:   Physical Exam  Constitutional: She is oriented to person, place, and time. She appears well-developed and well-nourished. No distress.  HENT:  Head: Normocephalic and atraumatic.  Right Ear: Hearing, tympanic membrane and external ear normal. A foreign body (scar tissue versus cerumen located in the lower corner around 7:00 position; she has had surgery on the mastoid process on the right side when she was a child secondary to mastoiditis from uncontrolled otitis media) is present.  Left Ear: Hearing, tympanic membrane, external ear and ear canal normal.  Nose: Right sinus exhibits frontal sinus tenderness. Right sinus exhibits no maxillary sinus tenderness. Left sinus exhibits frontal sinus tenderness. Left sinus exhibits no maxillary sinus tenderness.  Mouth/Throat: Uvula is midline, oropharynx is clear and moist and mucous membranes are normal. No oropharyngeal exudate, posterior oropharyngeal edema or posterior oropharyngeal erythema.  Eyes: Conjunctivae and EOM are normal. Pupils are equal, round, and reactive to light. Right eye exhibits no discharge. Left eye exhibits no discharge. No scleral icterus. Right eye exhibits no nystagmus. Left eye exhibits no nystagmus.  Neck: Normal range of motion. Neck supple. No tracheal deviation present. No thyromegaly  present.  Cardiovascular: Normal  rate, regular rhythm and normal heart sounds.  Exam reveals no gallop and no friction rub.   No murmur heard. Pulmonary/Chest: Effort normal and breath sounds normal. No stridor. No respiratory distress. She has no wheezes. She has no rales.  Lymphadenopathy:    She has no cervical adenopathy.  Neurological: She is alert and oriented to person, place, and time. She has normal strength. No cranial nerve deficit or sensory deficit. She displays a negative Romberg sign. Coordination and gait normal.  Skin: Skin is warm and dry. She is not diaphoretic.  Vitals reviewed.      Assessment:     1. Vertigo         Plan:     1. Vertigo Worsening symptoms. She was given Phenergan for her nausea and vomiting on Monday. This has not helped any of her symptoms. I will add meclizine as below. She may take the meclizine and Phenergan together. I did advise her of drowsiness precautions with both of these medications. I also gave her a handout on how to perform the Brandt-Daroff exercises. She is to repeat this exercise 4-5 times on each side and repeat 3 times daily. I did also advise for her to make sure to try to stay well-hydrated. She is to call the office if symptoms fail to improve or worsen in the next 2-3 days. - meclizine (ANTIVERT) 25 MG tablet; Take 1 tablet (25 mg total) by mouth 3 (three) times daily as needed for dizziness.  Dispense: 30 tablet; Refill: 0

## 2015-08-18 NOTE — Telephone Encounter (Signed)
Patient is scheduled to come in today at 4:15.  Thanks,  -Joseline

## 2015-08-18 NOTE — Telephone Encounter (Signed)
Pt called saying she was in Monday and seen [facility] for headaches.  She is still having headache and having to be out of work.  Please advise.  [phone]  Thanks Barth Kirks

## 2015-09-02 ENCOUNTER — Encounter: Payer: Self-pay | Admitting: Physician Assistant

## 2015-09-02 ENCOUNTER — Ambulatory Visit (INDEPENDENT_AMBULATORY_CARE_PROVIDER_SITE_OTHER): Payer: BC Managed Care – PPO | Admitting: Physician Assistant

## 2015-09-02 VITALS — BP 132/80 | HR 100 | Temp 98.5°F | Resp 16 | Wt 190.4 lb

## 2015-09-02 DIAGNOSIS — J020 Streptococcal pharyngitis: Secondary | ICD-10-CM | POA: Diagnosis not present

## 2015-09-02 DIAGNOSIS — R6889 Other general symptoms and signs: Secondary | ICD-10-CM

## 2015-09-02 DIAGNOSIS — H66004 Acute suppurative otitis media without spontaneous rupture of ear drum, recurrent, right ear: Secondary | ICD-10-CM

## 2015-09-02 DIAGNOSIS — J101 Influenza due to other identified influenza virus with other respiratory manifestations: Secondary | ICD-10-CM

## 2015-09-02 DIAGNOSIS — J029 Acute pharyngitis, unspecified: Secondary | ICD-10-CM

## 2015-09-02 LAB — POCT INFLUENZA A/B
INFLUENZA A, POC: NEGATIVE
INFLUENZA B, POC: POSITIVE — AB

## 2015-09-02 LAB — POCT RAPID STREP A (OFFICE): Rapid Strep A Screen: POSITIVE — AB

## 2015-09-02 MED ORDER — OSELTAMIVIR PHOSPHATE 75 MG PO CAPS: 75.0000 mg | ORAL_CAPSULE | Freq: Two times a day (BID) | ORAL | Status: DC

## 2015-09-02 MED ORDER — AMOXICILLIN-POT CLAVULANATE 875-125 MG PO TABS: 1.0000 | ORAL_TABLET | Freq: Two times a day (BID) | ORAL | Status: DC

## 2015-09-02 NOTE — Patient Instructions (Signed)
Otitis Media With Effusion Otitis media with effusion is the presence of fluid in the middle ear. This is a common problem in children, which often follows ear infections. It may be present for weeks or longer after the infection. Unlike an acute ear infection, otitis media with effusion refers only to fluid behind the ear drum and not infection. Children with repeated ear and sinus infections and allergy problems are the most likely to get otitis media with effusion. CAUSES  The most frequent cause of the fluid buildup is dysfunction of the eustachian tubes. These are the tubes that drain fluid in the ears to the back of the nose (nasopharynx). SYMPTOMS   The main symptom of this condition is hearing loss. As a result, you or your child may:  Listen to the TV at a loud volume.  Not respond to questions.  Ask "what" often when spoken to.  Mistake or confuse one sound or word for another.  There may be a sensation of fullness or pressure but usually not pain. DIAGNOSIS   Your health care provider will diagnose this condition by examining you or your child's ears.  Your health care provider may test the pressure in you or your child's ear with a tympanometer.  A hearing test may be conducted if the problem persists. TREATMENT   Treatment depends on the duration and the effects of the effusion.  Antibiotics, decongestants, nose drops, and cortisone-type drugs (tablets or nasal spray) may not be helpful.  Children with persistent ear effusions may have delayed language or behavioral problems. Children at risk for developmental delays in hearing, learning, and speech may require referral to a specialist earlier than children not at risk.  You or your child's health care provider may suggest a referral to an ear, nose, and throat surgeon for treatment. The following may help restore normal hearing:  Drainage of fluid.  Placement of ear tubes (tympanostomy tubes).  Removal of adenoids  (adenoidectomy). HOME CARE INSTRUCTIONS   Avoid secondhand smoke.  Infants who are breastfed are less likely to have this condition.  Avoid feeding infants while they are lying flat.  Avoid known environmental allergens.  Avoid people who are sick. SEEK MEDICAL CARE IF:   Hearing is not better in 3 months.  Hearing is worse.  Ear pain.  Drainage from the ear.  Dizziness. MAKE SURE YOU:   Understand these instructions.  Will watch your condition.  Will get help right away if you are not doing well or get worse.   This information is not intended to replace advice given to you by your health care provider. Make sure you discuss any questions you have with your health care provider.   Document Released: 08/10/2004 Document Revised: 07/24/2014 Document Reviewed: 01/28/2013 Elsevier Interactive Patient Education 2016 Elsevier Inc. Strep Throat Strep throat is a bacterial infection of the throat. Your health care provider may call the infection tonsillitis or pharyngitis, depending on whether there is swelling in the tonsils or at the back of the throat. Strep throat is most common during the cold months of the year in children who are 17-40 years of age, but it can happen during any season in people of any age. This infection is spread from person to person (contagious) through coughing, sneezing, or close contact. CAUSES Strep throat is caused by the bacteria called Streptococcus pyogenes. RISK FACTORS This condition is more likely to develop in:  People who spend time in crowded places where the infection can spread easily.  People who have close contact with someone who has strep throat. SYMPTOMS Symptoms of this condition include:  Fever or chills.   Redness, swelling, or pain in the tonsils or throat.  Pain or difficulty when swallowing.  White or yellow spots on the tonsils or throat.  Swollen, tender glands in the neck or under the jaw.  Red rash all over  the body (rare). DIAGNOSIS This condition is diagnosed by performing a rapid strep test or by taking a swab of your throat (throat culture test). Results from a rapid strep test are usually ready in a few minutes, but throat culture test results are available after one or two days. TREATMENT This condition is treated with antibiotic medicine. HOME CARE INSTRUCTIONS Medicines  Take over-the-counter and prescription medicines only as told by your health care provider.  Take your antibiotic as told by your health care provider. Do not stop taking the antibiotic even if you start to feel better.  Have family members who also have a sore throat or fever tested for strep throat. They may need antibiotics if they have the strep infection. Eating and Drinking  Do not share food, drinking cups, or personal items that could cause the infection to spread to other people.  If swallowing is difficult, try eating soft foods until your sore throat feels better.  Drink enough fluid to keep your urine clear or pale yellow. General Instructions  Gargle with a salt-water mixture 3-4 times per day or as needed. To make a salt-water mixture, completely dissolve -1 tsp of salt in 1 cup of warm water.  Make sure that all household members wash their hands well.  Get plenty of rest.  Stay home from school or work until you have been taking antibiotics for 24 hours.  Keep all follow-up visits as told by your health care provider. This is important. SEEK MEDICAL CARE IF:  The glands in your neck continue to get bigger.  You develop a rash, cough, or earache.  You cough up a thick liquid that is green, yellow-brown, or bloody.  You have pain or discomfort that does not get better with medicine.  Your problems seem to be getting worse rather than better.  You have a fever. SEEK IMMEDIATE MEDICAL CARE IF:  You have new symptoms, such as vomiting, severe headache, stiff or painful neck, chest pain,  or shortness of breath.  You have severe throat pain, drooling, or changes in your voice.  You have swelling of the neck, or the skin on the neck becomes red and tender.  You have signs of dehydration, such as fatigue, dry mouth, and decreased urination.  You become increasingly sleepy, or you cannot wake up completely.  Your joints become red or painful.   This information is not intended to replace advice given to you by your health care provider. Make sure you discuss any questions you have with your health care provider.   Document Released: 06/30/2000 Document Revised: 03/24/2015 Document Reviewed: 10/26/2014 Elsevier Interactive Patient Education 2016 Elsevier Inc. Influenza, Adult Influenza ("the flu") is a viral infection of the respiratory tract. It occurs more often in winter months because people spend more time in close contact with one another. Influenza can make you feel very sick. Influenza easily spreads from person to person (contagious). CAUSES  Influenza is caused by a virus that infects the respiratory tract. You can catch the virus by breathing in droplets from an infected person's cough or sneeze. You can also catch the  virus by touching something that was recently contaminated with the virus and then touching your mouth, nose, or eyes. RISKS AND COMPLICATIONS You may be at risk for a more severe case of influenza if you smoke cigarettes, have diabetes, have chronic heart disease (such as heart failure) or lung disease (such as asthma), or if you have a weakened immune system. Elderly people and pregnant women are also at risk for more serious infections. The most common problem of influenza is a lung infection (pneumonia). Sometimes, this problem can require emergency medical care and may be life threatening. SIGNS AND SYMPTOMS  Symptoms typically last 4 to 10 days and may include:  Fever.  Chills.  Headache, body aches, and muscle aches.  Sore throat.  Chest  discomfort and cough.  Poor appetite.  Weakness or feeling tired.  Dizziness.  Nausea or vomiting. DIAGNOSIS  Diagnosis of influenza is often made based on your history and a physical exam. A nose or throat swab test can be done to confirm the diagnosis. TREATMENT  In mild cases, influenza goes away on its own. Treatment is directed at relieving symptoms. For more severe cases, your health care provider may prescribe antiviral medicines to shorten the sickness. Antibiotic medicines are not effective because the infection is caused by a virus, not by bacteria. HOME CARE INSTRUCTIONS  Take medicines only as directed by your health care provider.  Use a cool mist humidifier to make breathing easier.  Get plenty of rest until your temperature returns to normal. This usually takes 3 to 4 days.  Drink enough fluid to keep your urine clear or pale yellow.  Cover yourmouth and nosewhen coughing or sneezing,and wash your handswellto prevent thevirusfrom spreading.  Stay homefromwork orschool untilthe fever is gonefor at least 59full day. PREVENTION  An annual influenza vaccination (flu shot) is the best way to avoid getting influenza. An annual flu shot is now routinely recommended for all adults in the [place] SEEK MEDICAL CARE IF:  You experiencechest pain, yourcough worsens,or you producemore mucus.  Youhave nausea,vomiting, ordiarrhea.  Your fever returns or gets worse. SEEK IMMEDIATE MEDICAL CARE IF:  You havetrouble breathing, you become short of breath,or your skin ornails becomebluish.  You have severe painor stiffnessin the neck.  You develop a sudden headache, or pain in the face or ear.  You have nausea or vomiting that you cannot control. MAKE SURE YOU:   Understand these instructions.  Will watch your condition.  Will get help right away if you are not doing well or get worse.   This information is not intended to replace advice given to  you by your health care provider. Make sure you discuss any questions you have with your health care provider.   Document Released: 06/30/2000 Document Revised: 07/24/2014 Document Reviewed: 10/02/2011 Elsevier Interactive Patient Education [employer]

## 2015-09-02 NOTE — Progress Notes (Signed)
Patient: Tammie Bush Female    DOB: 1970-10-29   45 y.o.   MRN: [id number] Visit Date: 09/02/2015  Today's Provider: Margaretann Loveless, PA-C   Chief Complaint  Patient presents with  . Sore Throat   Subjective:    Sore Throat  This is a new (Have a on at home sick with Fever and same symptoms she has.) problem. The current episode started in the past 7 days (Late Tuesday). The problem has been gradually worsening. Neither (all over) side of throat is experiencing more pain than the other. Maximum temperature: sweating a lot at night and getting cold a few minutes after. Associated symptoms include congestion, coughing (dry cough), headaches, a plugged ear sensation and trouble swallowing. Pertinent negatives include no abdominal pain, ear discharge, ear pain, shortness of breath or vomiting. Associated symptoms comments: Aching all over and having dizzy spells.. She has tried acetaminophen (Took Tylenol at 5:30 am) for the symptoms. The treatment provided no relief.  She has been around sick contacts including both of her sons. She states that one of her sons was diagnosed with the flu back at the beginning of February. He has also been around 2 coworkers that have bronchitis and sinus infections.     No Known Allergies Previous Medications   HYDROCHLOROTHIAZIDE (MICROZIDE) 12.5 MG CAPSULE    Take 1 capsule (12.5 mg total) by mouth daily.   MECLIZINE (ANTIVERT) 25 MG TABLET    Take 1 tablet (25 mg total) by mouth 3 (three) times daily as needed for dizziness.   OMEGA-3 FATTY ACIDS (FISH OIL) 1200 MG CAPS    Take 1 capsule by mouth daily.   PROMETHAZINE (PHENERGAN) 25 MG TABLET    Take 1 tablet (25 mg total) by mouth every 6 (six) hours as needed for nausea or vomiting.    Review of Systems  Constitutional: Positive for fever (100.5 last night), chills and fatigue (Very tired).  HENT: Positive for congestion, postnasal drip, rhinorrhea, sinus pressure, sneezing, sore throat,  trouble swallowing and voice change. Negative for ear discharge and ear pain.   Respiratory: Positive for cough (dry cough). Negative for chest tightness, shortness of breath and wheezing.   Cardiovascular: Negative for chest pain.  Gastrointestinal: Negative for nausea, vomiting and abdominal pain.  Neurological: Positive for dizziness and headaches.    Social History  Substance Use Topics  . Smoking status: Never Smoker   . Smokeless tobacco: Not on file  . Alcohol Use: No   Objective:   BP 132/80 mmHg  Pulse 100  Temp(Src) 98.5 F (36.9 C) (Oral)  Resp 16  Wt 190 lb 6.4 oz (86.365 kg)  SpO2 96%  Physical Exam  Constitutional: She appears well-developed and well-nourished. No distress.  HENT:  Head: Normocephalic and atraumatic.  Right Ear: Hearing, external ear and ear canal normal. Tympanic membrane is erythematous. A middle ear effusion (pus looking discharge in ear canal) is present.  Left Ear: Hearing, tympanic membrane, external ear and ear canal normal. Tympanic membrane is not erythematous and not bulging.  No middle ear effusion.  Nose: Mucosal edema and rhinorrhea present. Right sinus exhibits no maxillary sinus tenderness and no frontal sinus tenderness. Left sinus exhibits no maxillary sinus tenderness and no frontal sinus tenderness.  Mouth/Throat: Uvula is midline and mucous membranes are normal. Posterior oropharyngeal edema and posterior oropharyngeal erythema present. No oropharyngeal exudate.  Eyes: Conjunctivae are normal. Pupils are equal, round, and reactive to light. Right eye exhibits  no discharge. Left eye exhibits no discharge. No scleral icterus.  Neck: Normal range of motion. Neck supple. No tracheal deviation present. No thyromegaly present.  Cardiovascular: Normal rate, regular rhythm and normal heart sounds.  Exam reveals no gallop and no friction rub.   No murmur heard. Pulmonary/Chest: Effort normal and breath sounds normal. No stridor. No  respiratory distress. She has no wheezes. She has no rales.  Lymphadenopathy:    She has cervical adenopathy (left side).  Skin: Skin is warm and dry. She is not diaphoretic.  Vitals reviewed.       Assessment & Plan:     1. Influenza B Being that symptoms started late Tuesday evening she is still in the window for Tamiflu. I will give Tamiflu as below. She is to call the office if symptoms fail to improve or worsen in the meantime. I also gave her a note to be out of work for today and tomorrow. - oseltamivir (TAMIFLU) 75 MG capsule; Take 1 capsule (75 mg total) by mouth 2 (two) times daily.  Dispense: 10 capsule; Refill: 0  2. Strep throat Strep test was positive today in the office. I will give Augmentin as below. She may use salt water gargles for pain relief. She may take ibuprofen and/or Tylenol as needed for fevers and pain. She needs to get plenty of rest and to stay well-hydrated. She needs to call the office if symptoms don't improve or worsen. - amoxicillin-clavulanate (AUGMENTIN) 875-125 MG tablet; Take 1 tablet by mouth 2 (two) times daily.  Dispense: 20 tablet; Refill: 0  3. 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 Her right tympanic membrane does not have a normal appearance secondary to the surgery on the mastoid process. However today on exam she had a puslike discharge sitting in front of the tympanic membrane of the right ear. She denies any ear pain or changes in hearing. I will give Augmentin as below to help treat this infection as well. She is to call the office if symptoms fail to improve or worsen. - amoxicillin-clavulanate (AUGMENTIN) 875-125 MG tablet; Take 1 tablet by mouth 2 (two) times daily.  Dispense: 20 tablet; Refill: 0  4. Flu-like symptoms Positive flu test for influenza B. -  POCT Influenza A/B  5. Sore throat Positive rapid strep test. - POCT rapid strep A       Margaretann Loveless, PA-C  [facility]

## 2015-09-10 ENCOUNTER — Encounter: Payer: Self-pay | Admitting: Physician Assistant

## 2015-09-10 DIAGNOSIS — R42 Dizziness and giddiness: Secondary | ICD-10-CM

## 2015-09-14 ENCOUNTER — Telehealth: Payer: Self-pay | Admitting: Physician Assistant

## 2015-09-14 NOTE — Telephone Encounter (Signed)
Insurance company would like to talk peer to peer to get CT approved.Phone [phone] °

## 2015-09-14 NOTE — Telephone Encounter (Signed)
Approval # [id number] Approved 09/14/15-10/13/15

## 2015-09-24 ENCOUNTER — Ambulatory Visit
Admission: RE | Admit: 2015-09-24 | Discharge: 2015-09-24 | Disposition: A | Discharge status: Home / Self Care | Payer: BC Managed Care – PPO | Source: Ambulatory Visit | Attending: Physician Assistant | Admitting: Physician Assistant

## 2015-09-24 DIAGNOSIS — R42 Dizziness and giddiness: Secondary | ICD-10-CM | POA: Insufficient documentation

## 2015-09-24 DIAGNOSIS — Z9889 Other specified postprocedural states: Secondary | ICD-10-CM | POA: Insufficient documentation

## 2015-09-28 ENCOUNTER — Encounter: Payer: Self-pay | Admitting: Physician Assistant

## 2015-09-28 ENCOUNTER — Ambulatory Visit (INDEPENDENT_AMBULATORY_CARE_PROVIDER_SITE_OTHER): Payer: BC Managed Care – PPO | Admitting: Physician Assistant

## 2015-09-28 VITALS — BP 114/70 | HR 72 | Temp 98.5°F | Resp 16 | Wt 198.2 lb

## 2015-09-28 DIAGNOSIS — R030 Elevated blood-pressure reading, without diagnosis of hypertension: Secondary | ICD-10-CM

## 2015-09-28 DIAGNOSIS — E7800 Pure hypercholesterolemia, unspecified: Secondary | ICD-10-CM

## 2015-09-28 DIAGNOSIS — R7309 Other abnormal glucose: Secondary | ICD-10-CM | POA: Diagnosis not present

## 2015-09-28 DIAGNOSIS — H8110 Benign paroxysmal vertigo, unspecified ear: Secondary | ICD-10-CM | POA: Diagnosis not present

## 2015-09-28 NOTE — Progress Notes (Signed)
Patient: Tammie Bush Female    DOB: 01/23/1971   45 y.o.   MRN: [id number] Visit Date: 09/28/2015  Today's Provider: Margaretann LovelessJennifer M Burnette, PA-C   Chief Complaint  Patient presents with  . Follow-up    Bood Pressure, Hgb A1C   Subjective:    HPI Borderline Blood Pressure: Patient here for follow-up of elevated blood pressure. She some exercising and is adherent to low salt diet. Blood pressure in the last follow-up was 120/80. Cardiac symptoms none. Patient denies chest pain, chest pressure/discomfort, exertional chest pressure/discomfort, fatigue, irregular heart beat, lower extremity edema, near-syncope and palpitations. She is on Hydrochlorothiazide.  Elevated Hgb A1C: She is also here to follow-up on Hgb A1C last one was at 6.1. She was also trying to make life style changes including exercise and a heart healthy diet. She is still trying to eat healthy, just not exercising as she should be.  Headache and Dizziness: Patient was having a lot of headache with dizziness in the last office visit.  A CT was obtained on 03/10 and it showed no acute finding or explanation for patients symptoms. She reports the dizziness is on and off. No blurred vision.   No Known Allergies Previous Medications   HYDROCHLOROTHIAZIDE (MICROZIDE) 12.5 MG CAPSULE    Take 1 capsule (12.5 mg total) by mouth daily.   MECLIZINE (ANTIVERT) 25 MG TABLET    Take 1 tablet (25 mg total) by mouth 3 (three) times daily as needed for dizziness.   OMEGA-3 FATTY ACIDS (FISH OIL) 1200 MG CAPS    Take 1 capsule by mouth daily.   PROMETHAZINE (PHENERGAN) 25 MG TABLET    Take 1 tablet (25 mg total) by mouth every 6 (six) hours as needed for nausea or vomiting.    Review of Systems  Constitutional: Negative for fever, chills and fatigue.  Eyes: Negative for visual disturbance.  Respiratory: Negative for choking, chest tightness, shortness of breath and wheezing.   Cardiovascular: Negative for chest pain,  palpitations and leg swelling.  Gastrointestinal: Negative for nausea, vomiting and abdominal pain.  Endocrine: Negative for cold intolerance, heat intolerance, polydipsia and polyuria.  Neurological: Positive for dizziness (sometimes). Negative for weakness and headaches.  Psychiatric/Behavioral: Negative for sleep disturbance.    Social History  Substance Use Topics  . Smoking status: Never Smoker   . Smokeless tobacco: Not on file  . Alcohol Use: No   Objective:   BP 114/70 mmHg  Pulse 72  Temp(Src) 98.5 F (36.9 C) (Oral)  Resp 16  Wt 198 lb 3.2 oz (89.903 kg)  Physical Exam  Constitutional: She is oriented to person, place, and time. She appears well-developed and well-nourished. No distress.  Neck: Normal range of motion. Neck supple.  Cardiovascular: Normal rate, regular rhythm and normal heart sounds.  Exam reveals no gallop and no friction rub.   No murmur heard. Pulmonary/Chest: Effort normal and breath sounds normal. No respiratory distress. She has no wheezes. She has no rales.  Neurological: She is alert and oriented to person, place, and time. She has normal strength. No cranial nerve deficit or sensory deficit. She displays a negative Romberg sign. Coordination and gait normal.  Skin: She is not diaphoretic.  Vitals reviewed.       Assessment & Plan:     1. Borderline blood pressure Stable.  Will check labs and f/u pending labs. If labs are stable will f/u in 6 months. She is to call the office if she  develops any acute issue, question or concern. - CBC with Differential/Platelet - Comprehensive metabolic panel  2. Elevated hemoglobin A1c Previous hgba1c was 6.1.  Will recheck and f/u pending labs.  Will see back in 6 months if labs stable. - Hemoglobin A1c  3. Hypercholesterolemia Will check labs and f/u pending labs.  If stable will see back in 6 months. - Lipid panel  4. BPPV (benign paroxysmal positional vertigo), unspecified laterality Normal head  CT. She is going to call Dr. Gregary Cromer office whom she has seen in the past to see if she can get an appointment to work up her vertigo. If she is nable to get an appointment she is to call the office and we will help facilitate this.  She is to continue her meclizine for any acute need.         Margaretann Loveless, PA-C  [facility]

## 2015-09-28 NOTE — Patient Instructions (Signed)
Benign Positional Vertigo Vertigo is the feeling that you or your surroundings are moving when they are not. Benign positional vertigo is the most common form of vertigo. The cause of this condition is not serious (is benign). This condition is triggered by certain movements and positions (is positional). This condition can be dangerous if it occurs while you are doing something that could endanger you or others, such as driving.  CAUSES In many cases, the cause of this condition is not known. It may be caused by a disturbance in an area of the inner ear that helps your brain to sense movement and balance. This disturbance can be caused by a viral infection (labyrinthitis), head injury, or repetitive motion. RISK FACTORS This condition is more likely to develop in:  Women.  People who are 50 years of age or older. SYMPTOMS Symptoms of this condition usually happen when you move your head or your eyes in different directions. Symptoms may start suddenly, and they usually last for less than a minute. Symptoms may include:  Loss of balance and falling.  Feeling like you are spinning or moving.  Feeling like your surroundings are spinning or moving.  Nausea and vomiting.  Blurred vision.  Dizziness.  Involuntary eye movement (nystagmus). Symptoms can be mild and cause only slight annoyance, or they can be severe and interfere with daily life. Episodes of benign positional vertigo may return (recur) over time, and they may be triggered by certain movements. Symptoms may improve over time. DIAGNOSIS This condition is usually diagnosed by medical history and a physical exam of the head, neck, and ears. You may be referred to a health care provider who specializes in ear, nose, and throat (ENT) problems (otolaryngologist) or a provider who specializes in disorders of the nervous system (neurologist). You may have additional testing, including:  MRI.  A CT scan.  Eye movement tests. Your  health care provider may ask you to change positions quickly while he or she watches you for symptoms of benign positional vertigo, such as nystagmus. Eye movement may be tested with an electronystagmogram (ENG), caloric stimulation, the Dix-Hallpike test, or the roll test.  An electroencephalogram (EEG). This records electrical activity in your brain.  Hearing tests. TREATMENT Usually, your health care provider will treat this by moving your head in specific positions to adjust your inner ear back to normal. Surgery may be needed in severe cases, but this is rare. In some cases, benign positional vertigo may resolve on its own in 2-4 weeks. HOME CARE INSTRUCTIONS Safety  Move slowly.Avoid sudden body or head movements.  Avoid driving.  Avoid operating heavy machinery.  Avoid doing any tasks that would be dangerous to you or others if a vertigo episode would occur.  If you have trouble walking or keeping your balance, try using a cane for stability. If you feel dizzy or unstable, sit down right away.  Return to your normal activities as told by your health care provider. Ask your health care provider what activities are safe for you. General Instructions  Take over-the-counter and prescription medicines only as told by your health care provider.  Avoid certain positions or movements as told by your health care provider.  Drink enough fluid to keep your urine clear or pale yellow.  Keep all follow-up visits as told by your health care provider. This is important. SEEK MEDICAL CARE IF:  You have a fever.  Your condition gets worse or you develop new symptoms.  Your family or friends   notice any behavioral changes.  Your nausea or vomiting gets worse.  You have numbness or a "pins and needles" sensation. SEEK IMMEDIATE MEDICAL CARE IF:  You have difficulty speaking or moving.  You are always dizzy.  You faint.  You develop severe headaches.  You have weakness in your  legs or arms.  You have changes in your hearing or vision.  You develop a stiff neck.  You develop sensitivity to light.   This information is not intended to replace advice given to you by your health care provider. Make sure you discuss any questions you have with your health care provider.   Document Released: 04/10/2006 Document Revised: 03/24/2015 Document Reviewed: 10/26/2014 Elsevier Interactive Patient Education 2016 Elsevier Inc.  

## 2015-09-29 ENCOUNTER — Telehealth: Payer: Self-pay

## 2015-09-29 LAB — CBC WITH DIFFERENTIAL/PLATELET
BASOS: 0 %
Basophils Absolute: 0 10*3/uL (ref 0.0–0.2)
EOS (ABSOLUTE): 0.6 10*3/uL — ABNORMAL HIGH (ref 0.0–0.4)
EOS: 6 %
HEMATOCRIT: 41.5 % (ref 34.0–46.6)
HEMOGLOBIN: 13.9 g/dL (ref 11.1–15.9)
IMMATURE GRANS (ABS): 0.1 10*3/uL (ref 0.0–0.1)
IMMATURE GRANULOCYTES: 1 %
Lymphocytes Absolute: 3.1 10*3/uL (ref 0.7–3.1)
Lymphs: 33 %
MCH: 29.2 pg (ref 26.6–33.0)
MCHC: 33.5 g/dL (ref 31.5–35.7)
MCV: 87 fL (ref 79–97)
MONOS ABS: 0.4 10*3/uL (ref 0.1–0.9)
Monocytes: 5 %
NEUTROS PCT: 55 %
Neutrophils Absolute: 5.3 10*3/uL (ref 1.4–7.0)
Platelets: 311 10*3/uL (ref 150–379)
RBC: 4.76 x10E6/uL (ref 3.77–5.28)
RDW: 13.9 % (ref 12.3–15.4)
WBC: 9.4 10*3/uL (ref 3.4–10.8)

## 2015-09-29 LAB — COMPREHENSIVE METABOLIC PANEL
A/G RATIO: 1.5 (ref 1.2–2.2)
ALBUMIN: 3.9 g/dL (ref 3.5–5.5)
ALT: 10 IU/L (ref 0–32)
AST: 11 IU/L (ref 0–40)
Alkaline Phosphatase: 64 IU/L (ref 39–117)
BUN / CREAT RATIO: 23 (ref 9–23)
BUN: 18 mg/dL (ref 6–24)
Bilirubin Total: <0.2 mg/dL (ref 0.0–1.2)
CALCIUM: 8.8 mg/dL (ref 8.7–10.2)
CO2: 26 mmol/L (ref 18–29)
Chloride: 103 mmol/L (ref 96–106)
Creatinine, Ser: 0.79 mg/dL (ref 0.57–1.00)
GFR, EST AFRICAN AMERICAN: 105 mL/min/{1.73_m2} (ref >59)
GFR, EST NON AFRICAN AMERICAN: 91 mL/min/{1.73_m2} (ref >59)
GLOBULIN, TOTAL: 2.6 g/dL (ref 1.5–4.5)
Glucose: 118 mg/dL — ABNORMAL HIGH (ref 65–99)
Potassium: 4.1 mmol/L (ref 3.5–5.2)
SODIUM: 141 mmol/L (ref 134–144)
TOTAL PROTEIN: 6.5 g/dL (ref 6.0–8.5)

## 2015-09-29 LAB — LIPID PANEL
CHOLESTEROL TOTAL: 170 mg/dL (ref 100–199)
Chol/HDL Ratio: 4.3 ratio units (ref 0.0–4.4)
HDL: 40 mg/dL (ref >39)
LDL Calculated: 105 mg/dL — ABNORMAL HIGH (ref 0–99)
TRIGLYCERIDES: 125 mg/dL (ref 0–149)
VLDL Cholesterol Cal: 25 mg/dL (ref 5–40)

## 2015-09-29 LAB — HEMOGLOBIN A1C
ESTIMATED AVERAGE GLUCOSE: 131 mg/dL
HEMOGLOBIN A1C: 6.2 % — AB (ref 4.8–5.6)

## 2015-09-29 NOTE — Telephone Encounter (Signed)
LMTCB  Thanks,  -Joseline 

## 2015-09-29 NOTE — Telephone Encounter (Signed)
-----   Message from Margaretann LovelessJennifer M Burnette, [place]PA-C sent at 09/29/2015  9:09 AM EDT ----- Cholesterol is much improved but sugar has increased only slightly from 6.1 to 6.2.  Continue to limit carbohydrates and sugar.  Will recheck hgba1c in 3 months.

## 2015-09-29 NOTE — Telephone Encounter (Signed)
Patient advised as directed below. She will call to schedule follow-up appointment.  Thanks,  -Damya Comley

## 2015-10-26 ENCOUNTER — Other Ambulatory Visit: Payer: Self-pay

## 2015-10-26 DIAGNOSIS — R030 Elevated blood-pressure reading, without diagnosis of hypertension: Secondary | ICD-10-CM

## 2015-10-26 LAB — HM MAMMOGRAPHY

## 2015-10-26 MED ORDER — HYDROCHLOROTHIAZIDE 12.5 MG PO CAPS: 12.5000 mg | ORAL_CAPSULE | Freq: Every day | ORAL | Status: DC

## 2015-10-26 NOTE — Telephone Encounter (Signed)
[facility] [place] day supply.

## 2015-12-09 ENCOUNTER — Encounter: Payer: Self-pay | Admitting: Physician Assistant

## 2016-04-12 ENCOUNTER — Ambulatory Visit: Payer: BC Managed Care – PPO | Admitting: Physician Assistant

## 2016-08-07 ENCOUNTER — Encounter: Payer: Self-pay | Admitting: Physician Assistant

## 2016-08-07 ENCOUNTER — Ambulatory Visit (INDEPENDENT_AMBULATORY_CARE_PROVIDER_SITE_OTHER): Payer: BC Managed Care – PPO | Admitting: Physician Assistant

## 2016-08-07 VITALS — BP 120/80 | HR 64 | Temp 98.3°F | Resp 16 | Wt 221.0 lb

## 2016-08-07 DIAGNOSIS — R7309 Other abnormal glucose: Secondary | ICD-10-CM | POA: Diagnosis not present

## 2016-08-07 DIAGNOSIS — R030 Elevated blood-pressure reading, without diagnosis of hypertension: Secondary | ICD-10-CM

## 2016-08-07 DIAGNOSIS — R21 Rash and other nonspecific skin eruption: Secondary | ICD-10-CM

## 2016-08-07 LAB — POCT GLYCOSYLATED HEMOGLOBIN (HGB A1C)
ESTIMATED AVERAGE GLUCOSE: 131
HEMOGLOBIN A1C: 6.2

## 2016-08-07 MED ORDER — PREDNISONE 10 MG (21) PO TBPK: ORAL_TABLET | ORAL | 0 refills | Status: DC

## 2016-08-07 NOTE — Patient Instructions (Signed)
Contact Dermatitis Introduction Dermatitis is redness, soreness, and swelling (inflammation) of the skin. Contact dermatitis is a reaction to certain substances that touch the skin. There are two types of contact dermatitis:  Irritant contact dermatitis. This type is caused by something that irritates your skin, such as dry hands from washing them too much. This type does not require previous exposure to the substance for a reaction to occur. This type is more common.  Allergic contact dermatitis. This type is caused by a substance that you are allergic to, such as a nickel allergy or poison ivy. This type only occurs if you have been exposed to the substance (allergen) before. Upon a repeat exposure, your body reacts to the substance. This type is less common. What are the causes? Many different substances can cause contact dermatitis. Irritant contact dermatitis is most commonly caused by exposure to:  Makeup.  Soaps.  Detergents.  Bleaches.  Acids.  Metal salts, such as nickel. Allergic contact dermatitis is most commonly caused by exposure to:  Poisonous plants.  Chemicals.  Jewelry.  Latex.  Medicines.  Preservatives in products, such as clothing. What increases the risk? This condition is more likely to develop in:  People who have jobs that expose them to irritants or allergens.  People who have certain medical conditions, such as asthma or eczema. What are the signs or symptoms? Symptoms of this condition may occur anywhere on your body where the irritant has touched you or is touched by you. Symptoms include:  Dryness or flaking.  Redness.  Cracks.  Itching.  Pain or a burning feeling.  Blisters.  Drainage of small amounts of blood or clear fluid from skin cracks. With allergic contact dermatitis, there may also be swelling in areas such as the eyelids, mouth, or genitals. How is this diagnosed? This condition is diagnosed with a medical history and  physical exam. A patch skin test may be performed to help determine the cause. If the condition is related to your job, you may need to see an occupational medicine specialist. How is this treated? Treatment for this condition includes figuring out what caused the reaction and protecting your skin from further contact. Treatment may also include:  Steroid creams or ointments. Oral steroid medicines may be needed in more severe cases.  Antibiotics or antibacterial ointments, if a skin infection is present.  Antihistamine lotion or an antihistamine taken by mouth to ease itching.  A bandage (dressing). Follow these instructions at home: Skin Care  Moisturize your skin as needed.  Apply cool compresses to the affected areas.  Try taking a bath with:  Epsom salts. Follow the instructions on the packaging. You can get these at your local pharmacy or grocery store.  Baking soda. Pour a small amount into the bath as directed by your health care provider.  Colloidal oatmeal. Follow the instructions on the packaging. You can get this at your local pharmacy or grocery store.  Try applying baking soda paste to your skin. Stir water into baking soda until it reaches a paste-like consistency.  Do not scratch your skin.  Bathe less frequently, such as every other day.  Bathe in lukewarm water. Avoid using hot water. Medicines  Take or apply over-the-counter and prescription medicines only as told by your health care provider.  If you were prescribed an antibiotic medicine, take or apply your antibiotic as told by your health care provider. Do not stop using the antibiotic even if your condition starts to improve. General instructions    Keep all follow-up visits as told by your health care provider. This is important.  Avoid the substance that caused your reaction. If you do not know what caused it, keep a journal to try to track what caused it. Write down:  What you eat.  What cosmetic  products you use.  What you drink.  What you wear in the affected area. This includes jewelry.  If you were given a dressing, take care of it as told by your health care provider. This includes when to change and remove it. Contact a health care provider if:  Your condition does not improve with treatment.  Your condition gets worse.  You have signs of infection such as swelling, tenderness, redness, soreness, or warmth in the affected area.  You have a fever.  You have new symptoms. Get help right away if:  You have a severe headache, neck pain, or neck stiffness.  You vomit.  You feel very sleepy.  You notice red streaks coming from the affected area.  Your bone or joint underneath the affected area becomes painful after the skin has healed.  The affected area turns darker.  You have difficulty breathing. This information is not intended to replace advice given to you by your health care provider. Make sure you discuss any questions you have with your health care provider. Document Released: 06/30/2000 Document Revised: 12/09/2015 Document Reviewed: 11/18/2014  2017 Elsevier  

## 2016-08-07 NOTE — Progress Notes (Signed)
Patient: Tammie Bush Female    DOB: 1971-03-06   46 y.o.   MRN: [id number] Visit Date: 08/07/2016  Today's Provider: Margaretann Loveless, PA-C   Chief Complaint  Patient presents with  . Hyperglycemia  . Hypertension  . Rash   Subjective:    HPI  Prediabetes, Follow-up:   Lab Results  Component Value Date   HGBA1C 6.2 08/07/2016   HGBA1C 6.2 (H) 09/28/2015   HGBA1C 6.1 (H) 05/05/2015   GLUCOSE 118 (H) 09/28/2015   GLUCOSE 106 (H) 05/05/2015    Last seen for for this9 months ago.  Management since that visit includes check lab. Current symptoms include none and have been stable.  Weight trend: increasing steadily Prior visit with dietician: no Current diet: in general, a "healthy" diet   Current exercise: no regular exercise  She is concerned because her mother has diabetes and her sister recently was found to have jumped from an A1c in the 6s to 8.2.   Pertinent Labs:    Component Value Date/Time   CHOL 170 09/28/2015 0931   TRIG 125 09/28/2015 0931   CHOLHDL 4.3 09/28/2015 0931   CREATININE 0.79 09/28/2015 0931    Wt Readings from Last 3 Encounters:  08/07/16 221 lb (100.2 kg)  09/28/15 198 lb 3.2 oz (89.9 kg)  09/02/15 190 lb 6.4 oz (86.4 kg)    Hypertension, follow-up:  BP Readings from Last 3 Encounters:  08/07/16 120/80  09/28/15 114/70  09/02/15 132/80    She was last seen for hypertension 9 months ago.  BP at that visit was 114/70. Management changes since that visit include no changes. She reports excellent compliance with treatment. She is not having side effects.  She is not exercising. She is adherent to low salt diet.   Outside blood pressures are stable. She is experiencing lower extremity edema.  Patient denies chest pain.   Cardiovascular risk factors include obesity (BMI >= 30 kg/m2).  Use of agents associated with hypertension: none.     Weight trend: increasing steadily Wt Readings from Last 3 Encounters:    08/07/16 221 lb (100.2 kg)  09/28/15 198 lb 3.2 oz (89.9 kg)  09/02/15 190 lb 6.4 oz (86.4 kg)    Current diet: in general, a "healthy" diet    ------------------------------------------------------------------------  Patient here c/o rash and redness on right arm since 4 days. Patient denies rash spreading. Patient denies any new lotions or detergent. Patient has not had similar rash. 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 No burning. Very pruritic.     No Known Allergies  Patient Active Problem List   Diagnosis Date Noted  . BPPV (benign paroxysmal positional vertigo) 09/28/2015  . Elevated hemoglobin A1c 06/02/2015  . Hypercholesterolemia 06/02/2015  . Borderline blood pressure 05/05/2015     Current Outpatient Prescriptions:  .  hydrochlorothiazide (MICROZIDE) 12.5 MG capsule, Take 1 capsule (12.5 mg total) by mouth daily., Disp: 90 capsule, Rfl: 3 .  Omega-3 Fatty Acids (FISH OIL) 1200 MG CAPS, Take 1 capsule by mouth daily., Disp: , Rfl:   Review of Systems  Constitutional: Negative.   Respiratory: Negative.   Cardiovascular: Negative.   Gastrointestinal: Negative.   Endocrine: Negative.   Musculoskeletal: Positive for arthralgias (left ankle).  Skin: Positive for rash.    Social History  Substance Use Topics  . Smoking status: Never Smoker  . Smokeless tobacco: Never Used  . Alcohol  use No   Objective:   BP 120/80 (BP Location: Left Arm, Patient Position: Sitting, Cuff Size: Large)   Pulse 64   Temp 98.3 F (36.8 C) (Oral)   Resp 16   Wt 221 lb (100.2 kg)   Physical Exam  Constitutional: She appears well-developed and well-nourished. No distress.  Neck: Normal range of motion. Neck supple. No JVD present. No tracheal deviation present. No thyromegaly present.  Cardiovascular: Normal rate, regular rhythm and normal heart sounds.  Exam reveals no gallop and no friction rub.   No murmur  heard. Pulmonary/Chest: Effort normal and breath sounds normal. No respiratory distress. She has no wheezes. She has no rales.  Lymphadenopathy:    She has no cervical adenopathy.  Skin: Rash noted. Rash is urticarial. She is not diaphoretic.     Vitals reviewed.      Assessment & Plan:     1. Borderline blood pressure Stable. Continue HCTZ 12.5mg .  2. Elevated hemoglobin A1c Stable at 6.2. Will continue to monitor. Advised of lifestyle modifications.  - POCT glycosylated hemoglobin (Hb A1C)  3. Rash and nonspecific skin eruption Unsure of source. Will treat with prednisone taper as below. She may apply hydrocortisone cream and benadryl cream to rash also. She is to call if symptoms worsen. - predniSONE (STERAPRED UNI-PAK 21 TAB) 10 MG (21) TBPK tablet; Take as directed on package instructions  Dispense: 21 tablet; Refill: 0       Margaretann LovelessJennifer M Markham Dumlao, PA-C  [facility]

## 2016-11-10 ENCOUNTER — Ambulatory Visit: Payer: Self-pay | Admitting: Certified Nurse Midwife

## 2016-11-10 ENCOUNTER — Encounter: Payer: Self-pay | Admitting: Certified Nurse Midwife

## 2016-11-10 ENCOUNTER — Ambulatory Visit (INDEPENDENT_AMBULATORY_CARE_PROVIDER_SITE_OTHER): Payer: BC Managed Care – PPO | Admitting: Certified Nurse Midwife

## 2016-11-10 ENCOUNTER — Other Ambulatory Visit: Payer: Self-pay | Admitting: Certified Nurse Midwife

## 2016-11-10 VITALS — BP 112/78 | HR 64 | Ht 66.0 in | Wt 219.0 lb

## 2016-11-10 DIAGNOSIS — Z124 Encounter for screening for malignant neoplasm of cervix: Secondary | ICD-10-CM | POA: Diagnosis not present

## 2016-11-10 DIAGNOSIS — Z6835 Body mass index (BMI) 35.0-35.9, adult: Secondary | ICD-10-CM | POA: Diagnosis not present

## 2016-11-10 DIAGNOSIS — Z01419 Encounter for gynecological examination (general) (routine) without abnormal findings: Secondary | ICD-10-CM

## 2016-11-10 DIAGNOSIS — E669 Obesity, unspecified: Secondary | ICD-10-CM | POA: Insufficient documentation

## 2016-11-10 DIAGNOSIS — Z1231 Encounter for screening mammogram for malignant neoplasm of breast: Secondary | ICD-10-CM

## 2016-11-10 NOTE — Progress Notes (Signed)
Gynecology Annual Exam  PCP: Margaretann Loveless, PA-C  Chief Complaint:  Chief Complaint  Patient presents with  . Gynecologic Exam    History of Present Illness: Patient is a 46 y.o. Z6X0960 presents for annual exam. The patient has no complaints today. She has a history of a LSH for menorrhagia in 2012. Has not had any vaginal bleeding.  Her past medical history is notable for hypertension, obesity and prediabetes. She was diagnosed with benign postural vertigo last year, but has not had any of those symptoms this year. Her last Pap smear was 10/26/2015 and was NIL/neg HRHPV. The patient is sexually active. She denies dyspareunia.  The patient does not perform monthly self breast exams.  There is notable family history of breast cancer in her sister at age 69. Genetic testing has not been done. There is no history of  ovarian cancer in her family. She does not smoke. She does not drink alcohol. She exercises occasionally. Current BMI=35.5 kg/m2. She does get adequate calcium in her diet.  Her routine screening labs are managed by her PCP. Her cholesterol screen in 2017 was normal.   Review of Systems: Review of Systems  Constitutional: Negative for chills, fever and weight loss.  HENT: Negative for congestion, sinus pain and sore throat.   Eyes: Negative for blurred vision and pain.  Respiratory: Negative for hemoptysis, shortness of breath and wheezing.   Cardiovascular: Negative for chest pain, palpitations and leg swelling.  Gastrointestinal: Negative for abdominal pain, blood in stool, diarrhea, heartburn, nausea and vomiting.  Genitourinary: Negative for dysuria, frequency, hematuria and urgency.  Musculoskeletal: Negative for back pain, joint pain and myalgias.  Skin: Negative for itching and rash.  Neurological: Negative for dizziness, tingling and headaches.  Endo/Heme/Allergies: Negative for environmental allergies and polydipsia. Does not bruise/bleed easily.   Negative for hirsutism   Psychiatric/Behavioral: Negative for depression. The patient is not nervous/anxious and does not have insomnia.     Past Medical History:  Past Medical History:  Diagnosis Date  . Diabetes mellitus, type II (HCC)   . Dysmenorrhea   . Hypertension   . Hypothyroidism   . Menorrhagia   . Morbid obesity (HCC)   . Vertigo     Past Surgical History:  Past Surgical History:  Procedure Laterality Date  . CRYOABLATION  08/24/2010   Her Option  . [facility]  04/13/2011   menorrhagia  . MASTOID DEBRIDEMENT     age 4  . OVARIAN CYST REMOVAL  04/13/2011  . TONSILLECTOMY  1977  . TUBAL LIGATION  2001   postpartum  . WISDOM TOOTH EXTRACTION        Family History:  Family History  Problem Relation Age of Onset  . Diabetes Mother   . Hypertension Mother   . Breast cancer Sister 60  . Heart disease Maternal Grandmother   . Lung cancer Maternal Grandfather     Social History:  Social History   Social History  . Marital status: Legally Separated    Spouse name: N/A  . Number of children: 4  . 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 Smoking status: Never Smoker  . Smokeless tobacco: Never Used  . Alcohol use No  . Drug use: No  . Sexual activity: Yes    Birth control/ protection: Surgical   Other Topics Concern  . Not on file   Social History Narrative  .  No narrative on file    Allergies:  No Known Allergies  Medications: Prior to Admission medications   Medication Sig Start Date End Date Taking? Authorizing Provider  aspirin 81 MG chewable tablet Chew by mouth daily.   Yes Historical Provider, MD  Sennosides-Docusate Sodium (SENNA-DOCUSATE SODIUM PO) Take by mouth.   Yes Historical Provider, MD  hydrochlorothiazide (MICROZIDE) 12.5 MG capsule Take 1 capsule (12.5 mg total) by mouth daily. 10/26/15   Margaretann Loveless, PA-C  Omega-3 Fatty Acids (FISH OIL) 1200 MG CAPS Take 1  capsule by mouth daily.    Historical Provider, MD    Physical Exam Vitals: BP 112/78   Pulse 64   Ht  (1.676 m)   Wt 219 lb (99.3 kg)   BMI 35.35 kg/m ).  General: pleasant WF, in NAD HEENT: normocephalic, anicteric Thyroid: no enlargement, no palpable nodules Pulmonary: No increased work of breathing, CTAB Cardiovascular: RRR without murmur Breast: Breast symmetrical, no tenderness, no palpable nodules or masses, no skin or nipple retraction present, no nipple discharge.  No axillary, infraclavicular, or supraclavicular lymphadenopathy. Abdomen: obese, soft, non-tender, non-distended.  Umbilicus without lesions.  No hepatomegaly or masses palpable. No evidence of hernia  Genitourinary:  External: Normal external female genitalia.  Normal urethral meatus, normal Bartholin's and Skene's glands.    Vagina: Normal vaginal mucosa, rectocele present    Cervix: Grossly normal in appearance, no bleeding  Uterus: surgically absent  Adnexa: no adnexal masses, NT  Rectal: deferred  Lymphatic: no evidence of inguinal lymphadenopathy Extremities: no edema, erythema, or tenderness Neurologic: Grossly intact Psychiatric: mood appropriate, affect full   Assessment: 46 y.o. Z6X0960 well woman exam Family history of breast cancer in her sister Rectocele/ Constipation-managed by her current regime of Sennacot  Plan:   1) Mammogram - recommend yearly screening mammogram.  Mammogram Was ordered today. Patient to call [facility] to schedule. Again offered genetic testing due to family history of breast cancer and patient declined.  2) Pap smear done  3) Routine healthcare maintenance including cholesterol, diabetes screening  managed by PCP   4) RTO 1 year for Pap smear.   Farrel Conners, CNM

## 2016-11-14 LAB — PAP IG, CT-NG, RFX HPV ALL
CHLAMYDIA, NUC. ACID AMP: NEGATIVE
Gonococcus by Nucleic Acid Amp: NEGATIVE
PAP Smear Comment: 0

## 2016-11-20 ENCOUNTER — Encounter: Payer: Self-pay | Admitting: Certified Nurse Midwife

## 2016-11-20 ENCOUNTER — Telehealth: Payer: Self-pay | Admitting: Certified Nurse Midwife

## 2016-11-20 NOTE — Telephone Encounter (Signed)
Pt is needing an itemized statement for her last annual. Please call pt cb# [phone]

## 2016-12-01 ENCOUNTER — Other Ambulatory Visit: Payer: Self-pay | Admitting: Physician Assistant

## 2016-12-01 DIAGNOSIS — R030 Elevated blood-pressure reading, without diagnosis of hypertension: Secondary | ICD-10-CM

## 2016-12-19 ENCOUNTER — Ambulatory Visit
Admission: RE | Admit: 2016-12-19 | Discharge: 2016-12-19 | Disposition: A | Discharge status: Home / Self Care | Payer: BC Managed Care – PPO | Source: Ambulatory Visit | Attending: Certified Nurse Midwife | Admitting: Certified Nurse Midwife

## 2016-12-19 DIAGNOSIS — Z1231 Encounter for screening mammogram for malignant neoplasm of breast: Secondary | ICD-10-CM | POA: Diagnosis not present

## 2016-12-21 ENCOUNTER — Other Ambulatory Visit: Payer: Self-pay | Admitting: *Deleted

## 2016-12-21 ENCOUNTER — Inpatient Hospital Stay
Admission: RE | Admit: 2016-12-21 | Discharge: 2016-12-21 | Disposition: A | Discharge status: Home / Self Care | Payer: Self-pay | Source: Ambulatory Visit | Attending: *Deleted | Admitting: *Deleted

## 2016-12-21 DIAGNOSIS — Z9289 Personal history of other medical treatment: Secondary | ICD-10-CM

## 2017-01-25 ENCOUNTER — Ambulatory Visit: Payer: BC Managed Care – PPO | Admitting: Physician Assistant

## 2017-05-07 ENCOUNTER — Encounter: Payer: Self-pay | Admitting: Physician Assistant

## 2017-05-07 ENCOUNTER — Ambulatory Visit (INDEPENDENT_AMBULATORY_CARE_PROVIDER_SITE_OTHER): Payer: BC Managed Care – PPO | Admitting: Physician Assistant

## 2017-05-07 VITALS — BP 120/82 | HR 80 | Temp 98.4°F | Wt 228.0 lb

## 2017-05-07 DIAGNOSIS — R7309 Other abnormal glucose: Secondary | ICD-10-CM

## 2017-05-07 DIAGNOSIS — E7800 Pure hypercholesterolemia, unspecified: Secondary | ICD-10-CM

## 2017-05-07 DIAGNOSIS — S80869A Insect bite (nonvenomous), unspecified lower leg, initial encounter: Secondary | ICD-10-CM

## 2017-05-07 DIAGNOSIS — R030 Elevated blood-pressure reading, without diagnosis of hypertension: Secondary | ICD-10-CM | POA: Diagnosis not present

## 2017-05-07 DIAGNOSIS — W57XXXA Bitten or stung by nonvenomous insect and other nonvenomous arthropods, initial encounter: Secondary | ICD-10-CM

## 2017-05-07 MED ORDER — PREDNISONE 10 MG (21) PO TBPK: ORAL_TABLET | ORAL | 0 refills | Status: DC

## 2017-05-07 NOTE — Patient Instructions (Signed)
Insect Bite, Adult An insect bite can make your skin red, itchy, and swollen. Some insects can spread disease to people with a bite. However, most insect bites do not lead to disease, and most are not serious. Follow these instructions at home: Bite area care  Do not scratch the bite area.  Keep the bite area clean and dry.  Wash the bite area every day with soap and water as told by your doctor.  Check the bite area every day for signs of infection. Check for: ? More redness, swelling, or pain. ? Fluid or blood. ? Warmth. ? Pus. Managing pain, itching, and swelling  You may put any of these on the bite area as told by your doctor: ? A baking soda paste. ? Cortisone cream. ? Calamine lotion.  If directed, put ice on the bite area. ? Put ice in a plastic bag. ? Place a towel between your skin and the bag. ? Leave the ice on for 20 minutes, 2-3 times a day. Medicines  Take medicines or put medicines on your skin only as told by your doctor.  If you were prescribed an antibiotic medicine, use it as told by your doctor. Do not stop using the antibiotic even if your condition improves. General instructions  Keep all follow-up visits as told by your doctor. This is important. How is this prevented? To help you have a lower risk of insect bites:  When you are outside, wear clothing that covers your arms and legs.  Use insect repellent. The best insect repellents have: ? An active ingredient of DEET, picaridin, oil of lemon eucalyptus (OLE), or IR3535. ? Higher amounts of DEET or another active ingredient than other repellents have.  If your home windows do not have screens, think about putting some in.  Contact a doctor if:  You have more redness, swelling, or pain in the bite area.  You have fluid, blood, or pus coming from the bite area.  The bite area feels warm.  You have a fever. Get help right away if:  You have joint pain.  You have a rash.  You have  shortness of breath.  You feel more tired or sleepy than you normally do.  You have neck pain.  You have a headache.  You feel weaker than you normally do.  You have chest pain.  You have pain in your belly.  You feel sick to your stomach (nauseous) or you throw up (vomit). Summary  An insect bite can make your skin red, itchy, and swollen.  Do not scratch the bite area, and keep it clean and dry.  Ice can help with pain and itching from the bite. This information is not intended to replace advice given to you by your health care provider. Make sure you discuss any questions you have with your health care provider. Document Released: 06/30/2000 Document Revised: 02/03/2016 Document Reviewed: 11/18/2014 Elsevier Interactive Patient Education  2018 Elsevier Inc.  

## 2017-05-07 NOTE — Progress Notes (Signed)
Patient: Tammie Bush Female    DOB: [birth year]   46 y.o.   MRN: [id number] Visit Date: 05/07/2017  Today's Provider: Margaretann Loveless, PA-C   Chief Complaint  Patient presents with  . Rash  . Osteoarthritis   Subjective:    HPI Patient here today C/O rash on feet, ankles and legs since last Tuesday. Patient reports severe itching all over legs. Patient reports rash is spreading. Patient reports that rash started on feet and ankles and now has spread to legs. Patient reports that last night she tool Benadryl and reports good symptom control. Patient reports that she has also been using OTC cream. She reports her son brought a new, stray kitten into the home before lesions started to appear. Tammie Bush has since been removed from the house.   Patient requesting labs to check lipid and A1c. Last ov 08/07/16   Lab Results  Component Value Date   WBC 9.4 09/28/2015   HGB 13.9 09/28/2015   HCT 41.5 09/28/2015   PLT 311 09/28/2015   GLUCOSE 118 (H) 09/28/2015   CHOL 170 09/28/2015   TRIG 125 09/28/2015   HDL 40 09/28/2015   LDLCALC 105 (H) 09/28/2015   ALT 10 09/28/2015   AST 11 09/28/2015   NA 141 09/28/2015   K 4.1 09/28/2015   CL 103 09/28/2015   CREATININE 0.79 09/28/2015   BUN 18 09/28/2015   CO2 26 09/28/2015   TSH 4.350 05/05/2015   HGBA1C 6.2 08/07/2016       No Known Allergies   Current Outpatient Prescriptions:  .  aspirin 81 MG chewable tablet, Chew by mouth daily., Disp: , Rfl:  .  hydrochlorothiazide (MICROZIDE) 12.5 MG capsule, TAKE 1 CAPSULE (12.5 MG TOTAL) BY MOUTH DAILY., Disp: 90 capsule, Rfl: 3 .  Omega-3 Fatty Acids (FISH OIL) 1200 MG CAPS, Take 1 capsule by mouth daily., Disp: , Rfl:  .  Sennosides-Docusate Sodium (SENNA-DOCUSATE SODIUM PO), Take by mouth., Disp: , Rfl:   Review of Systems  Constitutional: Negative.   Respiratory: Negative.   Cardiovascular: Negative.   Gastrointestinal: Negative.   Skin: Positive for rash.    Neurological: Negative.     Social History  Substance Use Topics  . Smoking status: Never Smoker  . Smokeless tobacco: Never Used  . Alcohol use No   Objective:   BP 120/82 (BP Location: Left Arm, Patient Position: Sitting, Cuff Size: Large)   Pulse 80   Temp 98.4 F (36.9 C)   Wt 228 lb (103.4 kg)   BMI 36.80 kg/m  Vitals:   05/07/17 1618  BP: 120/82  Pulse: 80  Temp: 98.4 F (36.9 C)  Weight: 228 lb (103.4 kg)     Physical Exam  Constitutional: She appears well-developed and well-nourished. No distress.  Neck: Normal range of motion. Neck supple.  Cardiovascular: Normal rate, regular rhythm and normal heart sounds.  Exam reveals no gallop and no friction rub.   No murmur heard. Pulmonary/Chest: Effort normal and breath sounds normal. No respiratory distress. She has no wheezes. She has no rales.  Skin: Rash noted. She is not diaphoretic.     Vitals reviewed.       Assessment & Plan:     1. Borderline blood pressure Will check labs as below and f/u pending results. - CBC with Differential/Platelet - Comprehensive metabolic panel  2. Hypercholesterolemia Will check labs as below and f/u pending results. - Lipid panel  3. Elevated hemoglobin A1c Will  check labs as below and f/u pending results.- Hemoglobin A1c   4. Flea bite of lower leg, unspecified laterality, initial encounter Has been improving slightly with benadryl and anti-histamine cream. Will give prednisone as below. Patient has already started fumigating house.  - predniSONE (STERAPRED UNI-PAK 21 TAB) 10 MG (21) TBPK tablet; Take as directed on package instructions; 6 day taper  Dispense: 21 tablet; Refill: 0       Margaretann LovelessJennifer M Traye Bates, PA-C  [facility]

## 2017-05-08 LAB — COMPLETE METABOLIC PANEL WITH GFR
AG RATIO: 1.5 (calc) (ref 1.0–2.5)
ALKALINE PHOSPHATASE (APISO): 64 U/L (ref 33–115)
ALT: 13 U/L (ref 6–29)
AST: 15 U/L (ref 10–35)
Albumin: 3.9 g/dL (ref 3.6–5.1)
BILIRUBIN TOTAL: 0.5 mg/dL (ref 0.2–1.2)
BUN: 10 mg/dL (ref 7–25)
CHLORIDE: 103 mmol/L (ref 98–110)
CO2: 27 mmol/L (ref 20–32)
Calcium: 8.9 mg/dL (ref 8.6–10.2)
Creat: 0.77 mg/dL (ref 0.50–1.10)
GFR, EST AFRICAN AMERICAN: 107 mL/min/{1.73_m2} (ref >=60)
GFR, Est Non African American: 93 mL/min/{1.73_m2} (ref >=60)
Globulin: 2.6 g/dL (calc) (ref 1.9–3.7)
Glucose, Bld: 131 mg/dL — ABNORMAL HIGH (ref 65–99)
POTASSIUM: 3.6 mmol/L (ref 3.5–5.3)
Sodium: 138 mmol/L (ref 135–146)
TOTAL PROTEIN: 6.5 g/dL (ref 6.1–8.1)

## 2017-05-08 LAB — LIPID PANEL
CHOLESTEROL: 193 mg/dL (ref <200)
HDL: 42 mg/dL — AB (ref >50)
LDL CHOLESTEROL (CALC): 126 mg/dL — AB
NON-HDL CHOLESTEROL (CALC): 151 mg/dL — AB (ref <130)
TRIGLYCERIDES: 135 mg/dL (ref <150)
Total CHOL/HDL Ratio: 4.6 (calc) (ref <5.0)

## 2017-05-08 LAB — CBC WITH DIFFERENTIAL/PLATELET
BASOS ABS: 67 {cells}/uL (ref 0–200)
BASOS PCT: 0.6 %
EOS ABS: 699 {cells}/uL — AB (ref 15–500)
Eosinophils Relative: 6.3 %
HEMATOCRIT: 38.9 % (ref 35.0–45.0)
HEMOGLOBIN: 13.6 g/dL (ref 11.7–15.5)
Lymphs Abs: 3652 cells/uL (ref 850–3900)
MCH: 29.2 pg (ref 27.0–33.0)
MCHC: 35 g/dL (ref 32.0–36.0)
MCV: 83.7 fL (ref 80.0–100.0)
MPV: 10.1 fL (ref 7.5–12.5)
Monocytes Relative: 5.1 %
NEUTROS ABS: 6116 {cells}/uL (ref 1500–7800)
Neutrophils Relative %: 55.1 %
Platelets: 330 10*3/uL (ref 140–400)
RBC: 4.65 10*6/uL (ref 3.80–5.10)
RDW: 13 % (ref 11.0–15.0)
Total Lymphocyte: 32.9 %
WBC: 11.1 10*3/uL — ABNORMAL HIGH (ref 3.8–10.8)
WBCMIX: 566 {cells}/uL (ref 200–950)

## 2017-05-08 LAB — HEMOGLOBIN A1C
EAG (MMOL/L): 7.4 (calc)
Hgb A1c MFr Bld: 6.3 % of total Hgb — ABNORMAL HIGH (ref <5.7)
Mean Plasma Glucose: 134 (calc)

## 2017-05-09 ENCOUNTER — Telehealth: Payer: Self-pay

## 2017-05-09 NOTE — Telephone Encounter (Signed)
-----   Message from Margaretann LovelessJennifer M Burnette, [place]PA-C sent at 05/09/2017 11:09 AM EDT ----- LDL borderline high, total cholesterol is normal. A1c up to 6.3 from 6.2. Continue to work on healthy lifestyle modifications. All other labs are WNL.

## 2017-05-09 NOTE — Telephone Encounter (Signed)
LMTCB

## 2017-05-10 NOTE — Telephone Encounter (Signed)
Patient was advised fo results yesterday.  Thanks,  -Amayiah Gosnell

## 2017-07-23 ENCOUNTER — Encounter: Payer: Self-pay | Admitting: Physician Assistant

## 2017-07-23 ENCOUNTER — Ambulatory Visit: Payer: BC Managed Care – PPO | Admitting: Physician Assistant

## 2017-07-23 VITALS — BP 130/86 | HR 80 | Temp 98.5°F | Resp 16 | Wt 230.0 lb

## 2017-07-23 DIAGNOSIS — R079 Chest pain, unspecified: Secondary | ICD-10-CM

## 2017-07-23 DIAGNOSIS — M79671 Pain in right foot: Secondary | ICD-10-CM

## 2017-07-23 DIAGNOSIS — M79672 Pain in left foot: Secondary | ICD-10-CM

## 2017-07-23 NOTE — Patient Instructions (Signed)
Dansko shoes, alegria shoes  Plantar Fasciitis Plantar fasciitis is a painful foot condition that affects the heel. It occurs when the band of tissue that connects the toes to the heel bone (plantar fascia) becomes irritated. This can happen after exercising too much or doing other repetitive activities (overuse injury). The pain from plantar fasciitis can range from mild irritation to severe pain that makes it difficult for you to walk or move. The pain is usually worse in the morning or after you have been sitting or lying down for a while. What are the causes? This condition may be caused by:  Standing for long periods of time.  Wearing shoes that do not fit.  Doing high-impact activities, including running, aerobics, and ballet.  Being overweight.  Having an abnormal way of walking (gait).  Having tight calf muscles.  Having high arches in your feet.  Starting a new athletic activity.  What are the signs or symptoms? The main symptom of this condition is heel pain. Other symptoms include:  Pain that gets worse after activity or exercise.  Pain that is worse in the morning or after resting.  Pain that goes away after you walk for a few minutes.  How is this diagnosed? This condition may be diagnosed based on your signs and symptoms. Your health care provider will also do a physical exam to check for:  A tender area on the bottom of your foot.  A high arch in your foot.  Pain when you move your foot.  Difficulty moving your foot.  You may also need to have imaging studies to confirm the diagnosis. These can include:  X-rays.  Ultrasound.  MRI.  How is this treated? Treatment for plantar fasciitis depends on the severity of the condition. Your treatment may include:  Rest, ice, and over-the-counter pain medicines to manage your pain.  Exercises to stretch your calves and your plantar fascia.  A splint that holds your foot in a stretched, upward position  while you sleep (night splint).  Physical therapy to relieve symptoms and prevent problems in the future.  Cortisone injections to relieve severe pain.  Extracorporeal shock wave therapy (ESWT) to stimulate damaged plantar fascia with electrical impulses. It is often used as a last resort before surgery.  Surgery, if other treatments have not worked after 12 months.  Follow these instructions at home:  Take medicines only as directed by your health care provider.  Avoid activities that cause pain.  Roll the bottom of your foot over a bag of ice or a bottle of cold water. Do this for 20 minutes, 3-4 times a day.  Perform simple stretches as directed by your health care provider.  Try wearing athletic shoes with air-sole or gel-sole cushions or soft shoe inserts.  Wear a night splint while sleeping, if directed by your health care provider.  Keep all follow-up appointments with your health care provider. How is this prevented?  Do not perform exercises or activities that cause heel pain.  Consider finding low-impact activities if you continue to have problems.  Lose weight if you need to. The best way to prevent plantar fasciitis is to avoid the activities that aggravate your plantar fascia. Contact a health care provider if:  Your symptoms do not go away after treatment with home care measures.  Your pain gets worse.  Your pain affects your ability to move or do your daily activities. This information is not intended to replace advice given to you by your health  care provider. Make sure you discuss any questions you have with your health care provider. Document Released: 03/28/2001 Document Revised: 12/06/2015 Document Reviewed: 05/13/2014 Elsevier Interactive Patient Education  2018 [employer] Plantar Fasciitis Rehab Ask your health care provider which exercises are safe for you. Do exercises exactly as told by your health care provider and adjust them as directed. It  is normal to feel mild stretching, pulling, tightness, or discomfort as you do these exercises, but you should stop right away if you feel sudden pain or your pain gets worse. Do not begin these exercises until told by your health care provider. Stretching and range of motion exercises These exercises warm up your muscles and joints and improve the movement and flexibility of your foot. These exercises also help to relieve pain. Exercise A: Plantar fascia stretch  1. Sit with your left / right leg crossed over your opposite knee. 2. Hold your heel with one hand with that thumb near your arch. With your other hand, hold your toes and gently pull them back toward the top of your foot. You should feel a stretch on the bottom of your toes or your foot or both. 3. Hold this stretch for__________ seconds. 4. Slowly release your toes and return to the starting position. Repeat __________ times. Complete this exercise __________ times a day. Exercise B: Gastroc, standing  1. Stand with your hands against a wall. 2. Extend your left / right leg behind you, and bend your front knee slightly. 3. Keeping your heels on the floor and keeping your back knee straight, shift your weight toward the wall without arching your back. You should feel a gentle stretch in your left / right calf. 4. Hold this position for __________ seconds. Repeat __________ times. Complete this exercise __________ times a day. Exercise C: Soleus, standing 1. Stand with your hands against a wall. 2. Extend your left / right leg behind you, and bend your front knee slightly. 3. Keeping your heels on the floor, bend your back knee and slightly shift your weight over the back leg. You should feel a gentle stretch deep in your calf. 4. Hold this position for __________ seconds. Repeat __________ times. Complete this exercise __________ times a day. Exercise D: Gastrocsoleus, standing 1. Stand with the ball of your left / right foot on a  step. The ball of your foot is on the walking surface, right under your toes. 2. Keep your other foot firmly on the same step. 3. Hold onto the wall or a railing for balance. 4. Slowly lift your other foot, allowing your body weight to press your heel down over the edge of the step. You should feel a stretch in your left / right calf. 5. Hold this position for __________ seconds. 6. Return both feet to the step. 7. Repeat this exercise with a slight bend in your left / right knee. Repeat __________ times with your left / right knee straight and __________ times with your left / right knee bent. Complete this exercise __________ times a day. Balance exercise This exercise builds your balance and strength control of your arch to help take pressure off your plantar fascia. Exercise E: Single leg stand 1. Without shoes, stand near a railing or in a doorway. You may hold onto the railing or door frame as needed. 2. Stand on your left / right foot. Keep your big toe down on the floor and try to keep your arch lifted. Do not let your foot roll  inward. 3. Hold this position for __________ seconds. 4. If this exercise is too easy, you can try it with your eyes closed or while standing on a pillow. Repeat __________ times. Complete this exercise __________ times a day. This information is not intended to replace advice given to you by your health care provider. Make sure you discuss any questions you have with your health care provider. Document Released: 07/03/2005 Document Revised: 03/07/2016 Document Reviewed: 05/17/2015 Elsevier Interactive Patient Education  2018 [employer].

## 2017-07-23 NOTE — Progress Notes (Signed)
Patient: Tammie Bush Female    DOB: 03-23-1971   47 y.o.   MRN: [id number] Visit Date: 07/23/2017  Today's Provider: Margaretann Loveless, PA-C   Chief Complaint  Patient presents with  . Foot Pain   Subjective:    HPI Patient here today C/O left heel pain worse than right worsening in the last month. Patient reports standing makes symptoms worse. Patient denies any injuries. Patient reports that she has had problems with foot pain in the past. Patient reports foot pops and crackles with movement. Patient reports she has been taking Aleve as needed reports mild symptom control.   Patient also C/O chest pain more left than right pt reports pain radiates down her sides. Patient reports being very tired with chest pain. Patient reports symptoms have been present for about one month. Patient reports symptoms are only present at work has not noticed it when she is off work. Patient denies any shortness of breath, vision changes, no arm or jaw pain.     No Known Allergies   Current Outpatient Medications:  .  aspirin 81 MG chewable tablet, Chew by mouth daily., Disp: , Rfl:  .  hydrochlorothiazide (MICROZIDE) 12.5 MG capsule, TAKE 1 CAPSULE (12.5 MG TOTAL) BY MOUTH DAILY., Disp: 90 capsule, Rfl: 3 .  Omega-3 Fatty Acids (FISH OIL) 1200 MG CAPS, Take 1 capsule by mouth daily., Disp: , Rfl:  .  Sennosides-Docusate Sodium (SENNA-DOCUSATE SODIUM PO), Take by mouth., Disp: , Rfl:   Review of Systems  Constitutional: Positive for activity change and fatigue.  HENT: Negative.   Respiratory: Positive for chest tightness.   Cardiovascular: Positive for chest pain.  Gastrointestinal: Negative.   Genitourinary: Negative.   Musculoskeletal: Positive for arthralgias and myalgias.  Neurological: Negative.   Psychiatric/Behavioral: Negative.     Social History   Tobacco Use  . Smoking status: Never Smoker  . Smokeless tobacco: Never Used  Substance Use Topics  . Alcohol use: No   Alcohol/week: 0.0 oz   Objective:   BP 130/86 (BP Location: Left Arm, Patient Position: Sitting, Cuff Size: Large)   Pulse 80   Temp 98.5 F (36.9 C) (Oral)   Resp 16   Wt 230 lb (104.3 kg)   SpO2 99%   BMI 37.12 kg/m  Vitals:   07/23/17 1638  BP: 130/86  Pulse: 80  Resp: 16  Temp: 98.5 F (36.9 C)  TempSrc: Oral  SpO2: 99%  Weight: 230 lb (104.3 kg)     Physical Exam  Constitutional: She appears well-developed and well-nourished. No distress.  Neck: Normal range of motion. Neck supple.  Cardiovascular: Normal rate, regular rhythm and normal heart sounds. Exam reveals no gallop and no friction rub.  No murmur heard. Pulmonary/Chest: Effort normal and breath sounds normal. No respiratory distress. She has no wheezes. She has no rales.  Musculoskeletal:       Right foot: There is tenderness. There is normal range of motion, no bony tenderness, no swelling and normal capillary refill.       Left foot: There is tenderness. There is normal range of motion, no bony tenderness, no swelling and normal capillary refill.  Pes planus bilaterally; wearing non-supportive shoes  Skin: She is not diaphoretic.  Vitals reviewed.       Assessment & Plan:     1. Heel pain, bilateral Pes planus noted. Suspect plantar fasciitis vs heel spurs or both. Discussed conservative measures with orthotics, better supportive shoes, frozen water bottle  massage, stretches. IBU or aleve for inflammation, compression stockings, elevating legs when she can. Will get imaging as below to check for bony abnormality. I will f/u with patient pending results. Would highly recommend podiatry referral at this time.  - DG Foot Complete Left; Future - DG Foot Complete Right; Future  2. Chest pain, unspecified type EKG showed NSR rate of 88 with no ST changes personally reviewed by me.  - EKG 12-Lead       Margaretann LovelessJennifer M Debra Calabretta, PA-C  [facility]

## 2017-07-24 ENCOUNTER — Ambulatory Visit
Admission: RE | Admit: 2017-07-24 | Discharge: 2017-07-24 | Disposition: A | Discharge status: Home / Self Care | Payer: BC Managed Care – PPO | Source: Ambulatory Visit | Attending: Physician Assistant | Admitting: Physician Assistant

## 2017-07-24 ENCOUNTER — Encounter: Payer: Self-pay | Admitting: Physician Assistant

## 2017-07-24 DIAGNOSIS — M79671 Pain in right foot: Secondary | ICD-10-CM

## 2017-07-24 DIAGNOSIS — M79672 Pain in left foot: Principal | ICD-10-CM

## 2017-07-25 ENCOUNTER — Telehealth: Payer: Self-pay

## 2017-07-25 DIAGNOSIS — M19072 Primary osteoarthritis, left ankle and foot: Secondary | ICD-10-CM

## 2017-07-25 DIAGNOSIS — M2142 Flat foot [pes planus] (acquired), left foot: Secondary | ICD-10-CM

## 2017-07-25 DIAGNOSIS — M19071 Primary osteoarthritis, right ankle and foot: Secondary | ICD-10-CM

## 2017-07-25 DIAGNOSIS — M2141 Flat foot [pes planus] (acquired), right foot: Secondary | ICD-10-CM

## 2017-07-25 MED ORDER — MELOXICAM 15 MG PO TABS: 15.0000 mg | ORAL_TABLET | Freq: Every day | ORAL | 0 refills | Status: DC

## 2017-07-25 NOTE — Telephone Encounter (Signed)
Referral placed.   Can send in meloxicam for patient to try. Do not take with IUB or aleve.

## 2017-07-25 NOTE — Telephone Encounter (Signed)
LMTCB or to view results through mychart if any question or concerns to call the clinic.  Thanks,  -Vedant Shehadeh

## 2017-07-25 NOTE — Telephone Encounter (Signed)
-----   Message from Margaretann LovelessJennifer M Burnette, PA-C sent at 07/25/2017  9:40 AM EST ----- Same findings as left foot. Degenerative changes, no fractures or heel spur.

## 2017-07-25 NOTE — Telephone Encounter (Signed)
Patient wants the referral to Podiatry. Patient report  taking Aleve 12 hr in the morning and even with rest her feet are hurting.  Thanks,  -Tammie Bush

## 2017-07-25 NOTE — Telephone Encounter (Signed)
Patient advised.  Thanks,  -Esiquio Boesen 

## 2017-07-25 NOTE — Telephone Encounter (Signed)
-----   Message from Margaretann LovelessJennifer M Burnette, PA-C sent at 07/25/2017  9:40 AM EST ----- No fracture or heel spur. There is degenerative changes noted in the left mid foot. Would recommend podiatry.

## 2017-07-25 NOTE — Telephone Encounter (Signed)
Patient returning your call.  Patient wanted me to let you know that her feet are hurting and the left one is extremely bad.  She states that they are throbbing.

## 2017-07-30 ENCOUNTER — Encounter: Payer: Self-pay | Admitting: Physician Assistant

## 2017-07-30 NOTE — Telephone Encounter (Signed)
Patient scheduled for Wednesday. She reports that she only wants to see Antony ContrasJenni.Advised that if she develops any SOB, fever that she will need to be seen sooner with another provider or go to urgent care.  Thanks,  -Josalynn Johndrow

## 2017-08-01 ENCOUNTER — Encounter: Payer: Self-pay | Admitting: Physician Assistant

## 2017-08-01 ENCOUNTER — Ambulatory Visit: Payer: BC Managed Care – PPO | Admitting: Physician Assistant

## 2017-08-01 VITALS — BP 120/94 | HR 75 | Temp 98.3°F | Resp 16 | Wt 226.0 lb

## 2017-08-01 DIAGNOSIS — R05 Cough: Secondary | ICD-10-CM

## 2017-08-01 DIAGNOSIS — J0140 Acute pansinusitis, unspecified: Secondary | ICD-10-CM

## 2017-08-01 DIAGNOSIS — R6889 Other general symptoms and signs: Secondary | ICD-10-CM | POA: Diagnosis not present

## 2017-08-01 DIAGNOSIS — R059 Cough, unspecified: Secondary | ICD-10-CM

## 2017-08-01 LAB — POCT INFLUENZA A/B
INFLUENZA A, POC: NEGATIVE
INFLUENZA B, POC: NEGATIVE

## 2017-08-01 MED ORDER — BENZONATATE 200 MG PO CAPS: 200.0000 mg | ORAL_CAPSULE | Freq: Two times a day (BID) | ORAL | 0 refills | Status: DC | PRN

## 2017-08-01 MED ORDER — HYDROCODONE-HOMATROPINE 5-1.5 MG/5ML PO SYRP: 5.0000 mL | ORAL_SOLUTION | Freq: Three times a day (TID) | ORAL | 0 refills | Status: DC | PRN

## 2017-08-01 MED ORDER — AMOXICILLIN-POT CLAVULANATE 875-125 MG PO TABS: 1.0000 | ORAL_TABLET | Freq: Two times a day (BID) | ORAL | 0 refills | Status: DC

## 2017-08-01 NOTE — Patient Instructions (Signed)

## 2017-08-01 NOTE — Progress Notes (Signed)
Patient: Tammie Bush Female    DOB: August 08, 1970   47 y.o.   MRN: [id number] Visit Date: 08/01/2017  Today's Provider: Margaretann Loveless, PA-C   Chief Complaint  Patient presents with  . Cough   Subjective:    Patient has had cough for 1 week. Patient has symptoms of body aches, chills, sweats, ear congestion, chest congestion, wheezing, dizziness, and shortness of breath. Patient stated she also had some vertigo yesterday. Patient has been taking otc cold medication and tylenol.    Cough  This is a new problem. The current episode started in the past 7 days. The problem occurs every few minutes. The cough is productive of sputum. Associated symptoms include chills, ear congestion, headaches, myalgias, postnasal drip, rhinorrhea, shortness of breath, sweats and wheezing. Pertinent negatives include no chest pain, ear pain, fever, heartburn, hemoptysis, nasal congestion, rash, sore throat or weight loss. The symptoms are aggravated by lying down and exercise. She has tried OTC cough suppressant for the symptoms. Her past medical history is significant for bronchitis.    No Known Allergies   Current Outpatient Medications:  .  aspirin 81 MG chewable tablet, Chew by mouth daily., Disp: , Rfl:  .  hydrochlorothiazide (MICROZIDE) 12.5 MG capsule, TAKE 1 CAPSULE (12.5 MG TOTAL) BY MOUTH DAILY., Disp: 90 capsule, Rfl: 3 .  meloxicam (MOBIC) 15 MG tablet, Take 1 tablet (15 mg total) by mouth daily., Disp: 30 tablet, Rfl: 0 .  Omega-3 Fatty Acids (FISH OIL) 1200 MG CAPS, Take 1 capsule by mouth daily., Disp: , Rfl:  .  Sennosides-Docusate Sodium (SENNA-DOCUSATE SODIUM PO), Take by mouth., Disp: , Rfl:   Review of Systems  Constitutional: Positive for chills. Negative for appetite change, fatigue, fever and weight loss.  HENT: Positive for congestion, postnasal drip and rhinorrhea. Negative for ear pain and sore throat.   Respiratory: Positive for cough, shortness of breath and  wheezing. Negative for hemoptysis and chest tightness.   Cardiovascular: Negative for chest pain and palpitations.  Gastrointestinal: Negative for abdominal pain, heartburn, nausea and vomiting.  Musculoskeletal: Positive for myalgias.  Skin: Negative for rash.  Neurological: Positive for dizziness and headaches. Negative for weakness.    Social History   Tobacco Use  . Smoking status: Never Smoker  . Smokeless tobacco: Never Used  Substance Use Topics  . Alcohol use: No    Alcohol/week: 0.0 oz   Objective:   BP (!) 120/94 (BP Location: Left Arm, Patient Position: Sitting, Cuff Size: Large)   Pulse 75   Temp 98.3 F (36.8 C) (Oral)   Resp 16   Wt 226 lb (102.5 kg)   SpO2 98%   BMI 36.48 kg/m  Vitals:   08/01/17 0855  BP: (!) 120/94  Pulse: 75  Resp: 16  Temp: 98.3 F (36.8 C)  TempSrc: Oral  SpO2: 98%  Weight: 226 lb (102.5 kg)     Physical Exam  Constitutional: She appears well-developed and well-nourished. No distress.  HENT:  Head: Normocephalic and atraumatic.  Right Ear: Hearing, tympanic membrane, external ear and ear canal normal.  Left Ear: Hearing, tympanic membrane, external ear and ear canal normal.  Nose: Mucosal edema present. No rhinorrhea. Right sinus exhibits maxillary sinus tenderness and frontal sinus tenderness. Left sinus exhibits maxillary sinus tenderness and frontal sinus tenderness.  Mouth/Throat: Uvula is midline, oropharynx is clear and moist and mucous membranes are normal. No oropharyngeal exudate, posterior oropharyngeal edema or posterior oropharyngeal erythema.  Eyes: Conjunctivae  are normal. Pupils are equal, round, and reactive to light. Right eye exhibits no discharge. Left eye exhibits no discharge. No scleral icterus.  Neck: Normal range of motion. Neck supple. No tracheal deviation present. No thyromegaly present.  Cardiovascular: Normal rate, regular rhythm and normal heart sounds. Exam reveals no gallop and no friction rub.  No  murmur heard. Pulmonary/Chest: Effort normal and breath sounds normal. No stridor. No respiratory distress. She has no wheezes. She has no rales.  Lymphadenopathy:    She has no cervical adenopathy.  Skin: Skin is warm and dry. She is not diaphoretic.  Vitals reviewed.      Assessment & Plan:     1. Flu-like symptoms Flu negative.  - POCT Influenza A/B  2. Acute pansinusitis, recurrence not specified Worsening symptoms that have not responded to OTC medications. Will give augmentin as below. Continue allergy medications. Stay well hydrated and get plenty of rest. Call if no symptom improvement or if symptoms worsen. - amoxicillin-clavulanate (AUGMENTIN) 875-125 MG tablet; Take 1 tablet by mouth 2 (two) times daily.  Dispense: 20 tablet; Refill: 0  3. Cough Worsening symptoms that has not responded to OTC medications. Will give Hycodan cough syrup as below for nighttime cough. Drowsiness precautions given to patient. Stay well hydrated. Use tessalon perles during the day. - benzonatate (TESSALON) 200 MG capsule; Take 1 capsule (200 mg total) by mouth 2 (two) times daily as needed for cough.  Dispense: 20 capsule; Refill: 0 - HYDROcodone-homatropine (HYCODAN) 5-1.5 MG/5ML syrup; Take 5 mLs by mouth every 8 (eight) hours as needed for cough.  Dispense: 120 mL; Refill: 0       Margaretann LovelessJennifer M Burnette, PA-C  [facility]

## 2017-08-05 ENCOUNTER — Encounter: Payer: Self-pay | Admitting: Physician Assistant

## 2017-08-05 DIAGNOSIS — R42 Dizziness and giddiness: Secondary | ICD-10-CM

## 2017-08-06 ENCOUNTER — Encounter: Payer: Self-pay | Admitting: Physician Assistant

## 2017-08-06 MED ORDER — MECLIZINE HCL 25 MG PO TABS: 25.0000 mg | ORAL_TABLET | Freq: Three times a day (TID) | ORAL | 0 refills | Status: DC | PRN

## 2017-08-07 ENCOUNTER — Encounter: Payer: Self-pay | Admitting: Podiatry

## 2017-08-07 ENCOUNTER — Ambulatory Visit: Payer: BC Managed Care – PPO | Admitting: Podiatry

## 2017-08-07 DIAGNOSIS — M722 Plantar fascial fibromatosis: Secondary | ICD-10-CM

## 2017-08-07 DIAGNOSIS — M19072 Primary osteoarthritis, left ankle and foot: Secondary | ICD-10-CM | POA: Diagnosis not present

## 2017-08-07 DIAGNOSIS — M19071 Primary osteoarthritis, right ankle and foot: Secondary | ICD-10-CM

## 2017-08-07 MED ORDER — MELOXICAM 15 MG PO TABS: 15.0000 mg | ORAL_TABLET | Freq: Every day | ORAL | 0 refills | Status: DC

## 2017-08-07 NOTE — Progress Notes (Signed)
   Subjective:    Patient ID: Tammie Bush, female    DOB: 03/16/1971, 47 y.o.   MRN: [id number]  HPI    Review of Systems  HENT: Positive for hearing loss and sinus pressure.   Neurological: Positive for dizziness, light-headedness and headaches.  All other systems reviewed and are negative.      Objective:   Physical Exam        Assessment & Plan:

## 2017-08-08 ENCOUNTER — Ambulatory Visit (INDEPENDENT_AMBULATORY_CARE_PROVIDER_SITE_OTHER): Payer: BC Managed Care – PPO | Admitting: Orthotics

## 2017-08-08 DIAGNOSIS — M722 Plantar fascial fibromatosis: Secondary | ICD-10-CM

## 2017-08-08 NOTE — Progress Notes (Signed)

## 2017-08-11 NOTE — Progress Notes (Signed)
   Subjective: Patient presents today for pain and tenderness in the plantar aspects of bilateral heels that began 4-6 months ago. She states her left foot is worse than the right. She also reports bilateral pes planus. Patient states that it hurts in the morning with the first steps out of bed. Patient presents today for further treatment and evaluation.   Past Medical History:  Diagnosis Date  . Diabetes mellitus, type II (HCC)   . Dysmenorrhea   . Hypertension   . Hypothyroidism   . Menorrhagia   . Morbid obesity (HCC)   . Vertigo      Objective: Physical Exam General: The patient is alert and oriented x3 in no acute distress.  Dermatology: Skin is warm, dry and supple bilateral lower extremities. Negative for open lesions or macerations bilateral.   Vascular: Dorsalis Pedis and Posterior Tibial pulses palpable bilateral.  Capillary fill time is immediate to all digits.  Neurological: Epicritic and protective threshold intact bilateral.   Musculoskeletal: Tenderness to palpation at the medial calcaneal tubercale and through the insertion of the plantar fascia of the bilateral feet. All other joints range of motion within normal limits bilateral. Strength 5/5 in all groups bilateral.   Assessment: 1. plantar fasciitis bilateral feet  Plan of Care:  1. Patient evaluated. Xrays from Epic reviewed.   2. Injection of 0.5cc Celestone soluspan injected into the bilateral heels.  3. Rx for Mobic provided to patient.  4. Appointment with Raif[facility] Nobleick for custom molded orthotics.  5. Instructed patient regarding therapies and modalities at home to alleviate symptoms.  6. Return to clinic as needed.  Works in [employer].    Felecia ShellingBrent M. Aldo Sondgeroth, DPM [facility]  Dr. Felecia ShellingBrent M. Lauren Aguayo, DPM    [address]                                   [place], [place] [address]                Office [phone]  Fax [phone]

## 2017-09-04 ENCOUNTER — Encounter: Payer: BC Managed Care – PPO | Admitting: Orthotics

## 2017-09-10 ENCOUNTER — Encounter: Payer: BC Managed Care – PPO | Admitting: Orthotics

## 2017-09-11 ENCOUNTER — Ambulatory Visit: Payer: BC Managed Care – PPO | Admitting: Orthotics

## 2017-09-11 DIAGNOSIS — M19072 Primary osteoarthritis, left ankle and foot: Secondary | ICD-10-CM

## 2017-09-11 DIAGNOSIS — M722 Plantar fascial fibromatosis: Secondary | ICD-10-CM

## 2017-09-14 HISTORY — PX: CHOLECYSTECTOMY: SHX55

## 2017-10-02 ENCOUNTER — Encounter: Payer: Self-pay | Admitting: Physician Assistant

## 2017-10-03 ENCOUNTER — Ambulatory Visit: Payer: BC Managed Care – PPO | Admitting: Physician Assistant

## 2017-10-03 NOTE — Progress Notes (Deleted)
       Patient: Tammie Bush Female    DOB: 14-Mar-1971   46 y.o.   MRN: [id number] Visit Date: 10/03/2017  Today's Provider: Margaretann LovelessJennifer M Burnette, PA-C   No chief complaint on file.  Subjective:    Abdominal Pain  The pain is located in the epigastric region.       No Known Allergies   Current Outpatient Medications:  .  amoxicillin-clavulanate (AUGMENTIN) 875-125 MG tablet, Take 1 tablet by mouth 2 (two) times daily., Disp: 20 tablet, Rfl: 0 .  aspirin 81 MG chewable tablet, Chew by mouth daily., Disp: , Rfl:  .  benzonatate (TESSALON) 200 MG capsule, Take 1 capsule (200 mg total) by mouth 2 (two) times daily as needed for cough. (Patient not taking: Reported on 08/07/2017), Disp: 20 capsule, Rfl: 0 .  hydrochlorothiazide (MICROZIDE) 12.5 MG capsule, TAKE 1 CAPSULE (12.5 MG TOTAL) BY MOUTH DAILY., Disp: 90 capsule, Rfl: 3 .  HYDROcodone-homatropine (HYCODAN) 5-1.5 MG/5ML syrup, Take 5 mLs by mouth every 8 (eight) hours as needed for cough., Disp: 120 mL, Rfl: 0 .  meclizine (ANTIVERT) 25 MG tablet, Take 1 tablet (25 mg total) by mouth 3 (three) times daily as needed for dizziness., Disp: 30 tablet, Rfl: 0 .  meloxicam (MOBIC) 15 MG tablet, Take 1 tablet (15 mg total) by mouth daily., Disp: 30 tablet, Rfl: 0 .  Omega-3 Fatty Acids (FISH OIL) 1200 MG CAPS, Take 1 capsule by mouth daily., Disp: , Rfl:  .  Sennosides-Docusate Sodium (SENNA-DOCUSATE SODIUM PO), Take by mouth., Disp: , Rfl:   Review of Systems  Constitutional: Negative.   Respiratory: Negative.   Cardiovascular: Negative.   Gastrointestinal: Positive for abdominal pain.    Social History   Tobacco Use  . Smoking status: Never Smoker  . Smokeless tobacco: Never Used  Substance Use Topics  . Alcohol use: No    Alcohol/week: 0.0 oz   Objective:   There were no vitals taken for this visit.   Physical Exam      Assessment & Plan:           Margaretann LovelessJennifer M Burnette, PA-C  [facility]

## 2017-10-04 ENCOUNTER — Other Ambulatory Visit: Payer: Self-pay

## 2017-10-04 DIAGNOSIS — M19071 Primary osteoarthritis, right ankle and foot: Secondary | ICD-10-CM

## 2017-10-04 DIAGNOSIS — M19072 Primary osteoarthritis, left ankle and foot: Secondary | ICD-10-CM

## 2017-10-04 MED ORDER — MELOXICAM 15 MG PO TABS
15.0000 mg | ORAL_TABLET | Freq: Every day | ORAL | 3 refills | Status: DC
Start: 2017-10-04 — End: 2017-11-20

## 2017-10-04 NOTE — Telephone Encounter (Signed)
Pharmacy refill request for Meloxicam.  Per Dr. Logan BoresEvans, ok to refill    Rx has been sent to pharmacy

## 2017-10-08 NOTE — Progress Notes (Signed)
Patient came in today to pick up custom made foot orthotics.  The goals were accomplished and the patient reported no dissatisfaction with said orthotics.  Patient was advised of breakin period and how to report any issues. 

## 2017-11-20 ENCOUNTER — Encounter: Payer: Self-pay | Admitting: Podiatry

## 2017-11-20 ENCOUNTER — Ambulatory Visit: Payer: BC Managed Care – PPO | Admitting: Podiatry

## 2017-11-20 DIAGNOSIS — M722 Plantar fascial fibromatosis: Secondary | ICD-10-CM | POA: Diagnosis not present

## 2017-11-20 MED ORDER — MELOXICAM 15 MG PO TABS: 15.0000 mg | ORAL_TABLET | Freq: Every day | ORAL | 1 refills | Status: AC

## 2017-11-23 NOTE — Progress Notes (Signed)
   Subjective: 47 year old female with PMHx of T2DM presenting today for follow up evaluation of bilateral plantar fasciitis. She states the plantar aspects of bilateral heels have been hurting for the past 2-3 weeks. She states the injection she received in the past helped alleviate the pain as well as wearing orthotics. Standing and walking for long periods of time increase the pain. Patient is here for further evaluation and treatment.   Past Medical History:  Diagnosis Date  . Diabetes mellitus, type II (HCC)   . Dysmenorrhea   . Hypertension   . Hypothyroidism   . Menorrhagia   . Morbid obesity (HCC)   . Vertigo      Objective: Physical Exam General: The patient is alert and oriented x3 in no acute distress.  Dermatology: Skin is warm, dry and supple bilateral lower extremities. Negative for open lesions or macerations bilateral.   Vascular: Dorsalis Pedis and Posterior Tibial pulses palpable bilateral.  Capillary fill time is immediate to all digits.  Neurological: Epicritic and protective threshold intact bilateral.   Musculoskeletal: Tenderness to palpation at the medial calcaneal tubercale and through the insertion of the plantar fascia of the bilateral feet. All other joints range of motion within normal limits bilateral. Strength 5/5 in all groups bilateral.   Assessment: 1. plantar fasciitis bilateral feet  Plan of Care:  1. Patient evaluated.   2. Injection of 0.5cc Celestone soluspan injected into the bilateral heels.  3. Rx for Mobic provided to patient.  4. Plantar fascial brace dispensed for the right foot.  5. Continue wearing custom molded orthotics.  6. Return to clinic in 4 weeks.   Works in [employer].    Tammie Bush, DPM [facility]  Dr. Felecia Bush, DPM    [address] [place], [place] [address]                Office [phone]  Fax [phone]

## 2017-12-04 ENCOUNTER — Encounter: Payer: Self-pay | Admitting: Physician Assistant

## 2017-12-05 ENCOUNTER — Encounter: Payer: Self-pay | Admitting: Physician Assistant

## 2017-12-05 ENCOUNTER — Telehealth: Payer: Self-pay

## 2017-12-05 NOTE — Telephone Encounter (Signed)
Patient called today wanting to be seen today for elevated blood pressure and abnormal labs. Patient reports that she needs afternoon appointment due to work. Patient aware that Victorino Dike and the rest of providers are booked for today. Patient advised to go to ER if her symptoms worsens later on and not to wait till appointment. Patient agreed.

## 2017-12-06 ENCOUNTER — Other Ambulatory Visit: Payer: Self-pay | Admitting: Physician Assistant

## 2017-12-06 ENCOUNTER — Ambulatory Visit: Payer: BC Managed Care – PPO | Admitting: Physician Assistant

## 2017-12-06 ENCOUNTER — Other Ambulatory Visit: Payer: Self-pay | Admitting: Certified Nurse Midwife

## 2017-12-06 ENCOUNTER — Encounter: Payer: Self-pay | Admitting: Physician Assistant

## 2017-12-06 VITALS — BP 118/96 | HR 76 | Temp 98.1°F | Resp 16 | Wt 221.0 lb

## 2017-12-06 DIAGNOSIS — E039 Hypothyroidism, unspecified: Secondary | ICD-10-CM | POA: Insufficient documentation

## 2017-12-06 DIAGNOSIS — I10 Essential (primary) hypertension: Secondary | ICD-10-CM

## 2017-12-06 DIAGNOSIS — R5383 Other fatigue: Secondary | ICD-10-CM

## 2017-12-06 DIAGNOSIS — R61 Generalized hyperhidrosis: Secondary | ICD-10-CM | POA: Diagnosis not present

## 2017-12-06 DIAGNOSIS — Z90710 Acquired absence of both cervix and uterus: Secondary | ICD-10-CM

## 2017-12-06 DIAGNOSIS — Z1231 Encounter for screening mammogram for malignant neoplasm of breast: Secondary | ICD-10-CM

## 2017-12-06 MED ORDER — AMLODIPINE BESYLATE 5 MG PO TABS: 5.0000 mg | ORAL_TABLET | Freq: Every day | ORAL | 1 refills | Status: DC

## 2017-12-06 NOTE — Progress Notes (Signed)
Patient: Tammie Bush Female    DOB: 13-Dec-1970   47 y.o.   MRN: [id number] Visit Date: 12/06/2017  Today's Provider: Margaretann Loveless, PA-C   Chief Complaint  Patient presents with  . Follow-up   Subjective:    HPI  Follow up ER visit  Patient was seen in ER for dizziness, headache, and chest pain on Sunday. She was treated for Vertigo. Treatment for this included labs, MRI. She reports excellent compliance with treatment. Patient reports she has not had to use Meclizine. She reports this condition is Unchanged. Patient reports blood pressure is still elevated. Patient is concerned about her abnormal labs.  -----------------------------------------------------------------------------------     Allergies  Allergen Reactions  . Sulfamethoxazole-Trimethoprim     rash     Current Outpatient Medications:  .  aspirin 81 MG chewable tablet, Chew by mouth daily., Disp: , Rfl:  .  hydrochlorothiazide (MICROZIDE) 12.5 MG capsule, TAKE 1 CAPSULE (12.5 MG TOTAL) BY MOUTH DAILY., Disp: 90 capsule, Rfl: 3 .  meclizine (ANTIVERT) 25 MG tablet, Take 1 tablet (25 mg total) by mouth 3 (three) times daily as needed for dizziness., Disp: 30 tablet, Rfl: 0 .  meloxicam (MOBIC) 15 MG tablet, Take 1 tablet (15 mg total) by mouth daily., Disp: 60 tablet, Rfl: 1 .  Omega-3 Fatty Acids (FISH OIL) 1200 MG CAPS, Take 1 capsule by mouth daily., Disp: , Rfl:  .  Sennosides-Docusate Sodium (SENNA-DOCUSATE SODIUM PO), Take by mouth., Disp: , Rfl:   Review of Systems  Constitutional: Positive for activity change, appetite change and fatigue.  HENT: Positive for congestion and rhinorrhea.   Respiratory: Positive for chest tightness and shortness of breath.   Cardiovascular: Negative.   Gastrointestinal: Negative.   Genitourinary: Negative.   Musculoskeletal: Negative.   Neurological: Positive for dizziness, light-headedness and headaches.  Psychiatric/Behavioral: The patient is  nervous/anxious.     Social History   Tobacco Use  . Smoking status: Never Smoker  . Smokeless tobacco: Never Used  Substance Use Topics  . Alcohol use: No    Alcohol/week: 0.0 oz   Objective:   BP (!) 118/96 (BP Location: Left Arm, Patient Position: Sitting, Cuff Size: Normal)   Pulse 76   Temp 98.1 F (36.7 C) (Oral)   Resp 16   Wt 221 lb (100.2 kg)   SpO2 98%   BMI 35.67 kg/m  Vitals:   12/06/17 0814  BP: (!) 118/96  Pulse: 76  Resp: 16  Temp: 98.1 F (36.7 C)  TempSrc: Oral  SpO2: 98%  Weight: 221 lb (100.2 kg)     Physical Exam  Constitutional: She appears well-developed and well-nourished. No distress.  HENT:  Head: Normocephalic and atraumatic.  Right Ear: Hearing, tympanic membrane and external ear normal.  Left Ear: Hearing, tympanic membrane and external ear normal.  Nose: Nose normal.  Mouth/Throat: Oropharynx is clear and moist. No oropharyngeal exudate.  Eyes: Pupils are equal, round, and reactive to light. Conjunctivae and EOM are normal. Right eye exhibits no discharge. Left eye exhibits no discharge.  Neck: Normal range of motion. Neck supple. No JVD present. No tracheal deviation present. No Brudzinski's sign and no Kernig's sign noted. No thyromegaly present.  Cardiovascular: Normal rate, regular rhythm and normal heart sounds. Exam reveals no gallop and no friction rub.  No murmur heard. Pulmonary/Chest: Effort normal and breath sounds normal. No stridor. No respiratory distress. She has no wheezes. She has no rales. She exhibits no tenderness.  Musculoskeletal: She exhibits no edema.  Lymphadenopathy:    She has no cervical adenopathy.  Skin: Skin is warm and dry. She is not diaphoretic.  Psychiatric: Her mood appears anxious.  Vitals reviewed.      Assessment & Plan:     1. Essential hypertension BP gets elevated at work. 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 Labs from [facility] ER  reviewed. Will recheck BS as below. Continue HCTZ 12.5mg  and will add amlodipine  as below. Push fluids. I will call once results received. I will see her in 2 weeks.  - Basic Metabolic Panel (BMET) - amLODipine (NORVASC) 5 MG tablet; Take 1 tablet (5 mg total) by mouth daily.  Dispense: 30 tablet; Refill: 1  2. Hypothyroidism, unspecified type TSH was 4.660 at [facility]. Will recheck with T4 to see if supplementation needed.  - T4 AND TSH  3. Hypomagnesemia States was told her magnesium was "really low" but on review of labs it was 1.7. Reports was given a bag of magnesium after lab drawn and never rechecked. Will recheck lab as below to see if supplementation required.  - Magnesium - Basic Metabolic Panel (BMET)  4. H/O vaginal hysterectomy Symptoms possibly associated with menopause. Patient had hysterectomy but ovaries remain. Will check labs as below.  - FSH/LH  5. Night sweats See above medical treatment plan. - FSH/LH - Basic Metabolic Panel (BMET)  6. Fatigue, unspecified type See above medical treatment plan. - T4 AND TSH - FSH/LH - Basic Metabolic Panel (BMET)   I spent approximately 45 minutes with the patient today. Over 50% of this time was spent with counseling and educating the patient.      Margaretann Loveless, PA-C  [facility]

## 2017-12-06 NOTE — Patient Instructions (Signed)
Amlodipine tablets °What is this medicine? °AMLODIPINE (am LOE di peen) is a calcium-channel blocker. It affects the amount of calcium found in your heart and muscle cells. This relaxes your blood vessels, which can reduce the amount of work the heart has to do. This medicine is used to lower high blood pressure. It is also used to prevent chest pain. °This medicine may be used for other purposes; ask your health care provider or pharmacist if you have questions. °COMMON BRAND NAME(S): Norvasc °What should I tell my health care provider before I take this medicine? °They need to know if you have any of these conditions: °-heart problems like heart failure or aortic stenosis °-liver disease °-an unusual or allergic reaction to amlodipine, other medicines, foods, dyes, or preservatives °-pregnant or trying to get pregnant °-breast-feeding °How should I use this medicine? °Take this medicine by mouth with a glass of water. Follow the directions on the prescription label. Take your medicine at regular intervals. Do not take more medicine than directed. °Talk to your pediatrician regarding the use of this medicine in children. Special care may be needed. This medicine has been used in children as young as 6. °Persons over 65 years old may have a stronger reaction to this medicine and need smaller doses. °Overdosage: If you think you have taken too much of this medicine contact a poison control center or emergency room at once. °NOTE: This medicine is only for you. Do not share this medicine with others. °What if I miss a dose? °If you miss a dose, take it as soon as you can. If it is almost time for your next dose, take only that dose. Do not take double or extra doses. °What may interact with this medicine? °-herbal or dietary supplements °-local or general anesthetics °-medicines for high blood pressure °-medicines for prostate problems °-rifampin °This list may not describe all possible interactions. Give your health  care provider a list of all the medicines, herbs, non-prescription drugs, or dietary supplements you use. Also tell them if you smoke, drink alcohol, or use illegal drugs. Some items may interact with your medicine. °What should I watch for while using this medicine? °Visit your doctor or health care professional for regular check ups. Check your blood pressure and pulse rate regularly. Ask your health care professional what your blood pressure and pulse rate should be, and when you should contact him or her. °This medicine may make you feel confused, dizzy or lightheaded. Do not drive, use machinery, or do anything that needs mental alertness until you know how this medicine affects you. To reduce the risk of dizzy or fainting spells, do not sit or stand up quickly, especially if you are an older patient. Avoid alcoholic drinks; they can make you more dizzy. °Do not suddenly stop taking amlodipine. Ask your doctor or health care professional how you can gradually reduce the dose. °What side effects may I notice from receiving this medicine? °Side effects that you should report to your doctor or health care professional as soon as possible: °-allergic reactions like skin rash, itching or hives, swelling of the face, lips, or tongue °-breathing problems °-changes in vision or hearing °-chest pain °-fast, irregular heartbeat °-swelling of legs or ankles °Side effects that usually do not require medical attention (report to your doctor or health care professional if they continue or are bothersome): °-dry mouth °-facial flushing °-nausea, vomiting °-stomach gas, pain °-tired, weak °-trouble sleeping °This list may not describe all possible side   effects. Call your doctor for medical advice about side effects. You may report side effects to FDA at 1-800-FDA-1088. °Where should I keep my medicine? °Keep out of the reach of children. °Store at room temperature between 59 and 86 degrees F (15 and 30 degrees C). Protect from  light. Keep container tightly closed. Throw away any unused medicine after the expiration date. °NOTE: This sheet is a summary. It may not cover all possible information. If you have questions about this medicine, talk to your doctor, pharmacist, or health care provider. °© 2018 Elsevier/Gold Standard (2012-05-31 11:40:58) ° °

## 2017-12-07 ENCOUNTER — Telehealth: Payer: Self-pay

## 2017-12-07 LAB — BASIC METABOLIC PANEL
BUN / CREAT RATIO: 18 (ref 9–23)
BUN: 13 mg/dL (ref 6–24)
CHLORIDE: 101 mmol/L (ref 96–106)
CO2: 22 mmol/L (ref 20–29)
Calcium: 9.5 mg/dL (ref 8.7–10.2)
Creatinine, Ser: 0.73 mg/dL (ref 0.57–1.00)
GFR calc Af Amer: 113 mL/min/{1.73_m2} (ref >59)
GFR calc non Af Amer: 98 mL/min/{1.73_m2} (ref >59)
Glucose: 134 mg/dL — ABNORMAL HIGH (ref 65–99)
Potassium: 4.1 mmol/L (ref 3.5–5.2)
SODIUM: 138 mmol/L (ref 134–144)

## 2017-12-07 LAB — FSH/LH
FSH: 6.6 m[IU]/mL
LH: 10 m[IU]/mL

## 2017-12-07 LAB — T4 AND TSH
T4 TOTAL: 6.5 ug/dL (ref 4.5–12.0)
TSH: 3.64 u[IU]/mL (ref 0.450–4.500)

## 2017-12-07 LAB — MAGNESIUM: MAGNESIUM: 2 mg/dL (ref 1.6–2.3)

## 2017-12-07 NOTE — Telephone Encounter (Signed)
Viewed by Gardiner Ramus on 12/07/2017 9:02 AM

## 2017-12-07 NOTE — Telephone Encounter (Signed)
-----   Message from Margaretann Loveless, PA-C sent at 12/07/2017  8:33 AM EDT ----- All labs are unremarkable. Thyroid is normal. Magnesium is normal. FSH and LH do not indicate menopause yet. Sugar is ok at 134. Will treat like hypertension as we discussed and I will see you back in 2 weeks.

## 2017-12-20 ENCOUNTER — Encounter: Payer: Self-pay | Admitting: Physician Assistant

## 2017-12-20 ENCOUNTER — Ambulatory Visit: Payer: BC Managed Care – PPO | Admitting: Physician Assistant

## 2017-12-20 VITALS — BP 140/90 | HR 89 | Temp 98.5°F | Resp 16 | Ht 66.0 in | Wt 227.4 lb

## 2017-12-20 DIAGNOSIS — F411 Generalized anxiety disorder: Secondary | ICD-10-CM

## 2017-12-20 DIAGNOSIS — B001 Herpesviral vesicular dermatitis: Secondary | ICD-10-CM | POA: Diagnosis not present

## 2017-12-20 DIAGNOSIS — I10 Essential (primary) hypertension: Secondary | ICD-10-CM

## 2017-12-20 MED ORDER — AMLODIPINE BESYLATE 5 MG PO TABS: 5.0000 mg | ORAL_TABLET | Freq: Every day | ORAL | 1 refills | Status: DC

## 2017-12-20 MED ORDER — BUSPIRONE HCL 10 MG PO TABS: 10.0000 mg | ORAL_TABLET | Freq: Two times a day (BID) | ORAL | 1 refills | Status: DC

## 2017-12-20 MED ORDER — VALACYCLOVIR HCL 1 G PO TABS: 2000.0000 mg | ORAL_TABLET | Freq: Every day | ORAL | 0 refills | Status: DC

## 2017-12-20 NOTE — Patient Instructions (Signed)
Buspirone tablets What is this medicine? BUSPIRONE (byoo SPYE rone) is used to treat anxiety disorders. This medicine may be used for other purposes; ask your health care provider or pharmacist if you have questions. COMMON BRAND NAME(S): BuSpar What should I tell my health care provider before I take this medicine? They need to know if you have any of these conditions: -kidney or liver disease -an unusual or allergic reaction to buspirone, other medicines, foods, dyes, or preservatives -pregnant or trying to get pregnant -breast-feeding How should I use this medicine? Take this medicine by mouth with a glass of water. Follow the directions on the prescription label. You may take this medicine with or without food. To ensure that this medicine always works the same way for you, you should take it either always with or always without food. Take your doses at regular intervals. Do not take your medicine more often than directed. Do not stop taking except on the advice of your doctor or health care professional. Talk to your pediatrician regarding the use of this medicine in children. Special care may be needed. Overdosage: If you think you have taken too much of this medicine contact a poison control center or emergency room at once. NOTE: This medicine is only for you. Do not share this medicine with others. What if I miss a dose? If you miss a dose, take it as soon as you can. If it is almost time for your next dose, take only that dose. Do not take double or extra doses. What may interact with this medicine? Do not take this medicine with any of the following medications: -linezolid -MAOIs like Carbex, Eldepryl, Marplan, Nardil, and Parnate -methylene blue -procarbazine This medicine may also interact with the following medications: -diazepam -digoxin -diltiazem -erythromycin -grapefruit juice -haloperidol -medicines for mental depression or mood problems -medicines for seizures like  carbamazepine, phenobarbital and phenytoin -nefazodone -other medications for anxiety -rifampin -ritonavir -some antifungal medicines like itraconazole, ketoconazole, and voriconazole -verapamil -warfarin This list may not describe all possible interactions. Give your health care provider a list of all the medicines, herbs, non-prescription drugs, or dietary supplements you use. Also tell them if you smoke, drink alcohol, or use illegal drugs. Some items may interact with your medicine. What should I watch for while using this medicine? Visit your doctor or health care professional for regular checks on your progress. It may take 1 to 2 weeks before your anxiety gets better. You may get drowsy or dizzy. Do not drive, use machinery, or do anything that needs mental alertness until you know how this drug affects you. Do not stand or sit up quickly, especially if you are an older patient. This reduces the risk of dizzy or fainting spells. Alcohol can make you more drowsy and dizzy. Avoid alcoholic drinks. What side effects may I notice from receiving this medicine? Side effects that you should report to your doctor or health care professional as soon as possible: -blurred vision or other vision changes -chest pain -confusion -difficulty breathing -feelings of hostility or anger -muscle aches and pains -numbness or tingling in hands or feet -ringing in the ears -skin rash and itching -vomiting -weakness Side effects that usually do not require medical attention (report to your doctor or health care professional if they continue or are bothersome): -disturbed dreams, nightmares -headache -nausea -restlessness or nervousness -sore throat and nasal congestion -stomach upset This list may not describe all possible side effects. Call your doctor for medical advice about side   effects. You may report side effects to FDA at 1-800-FDA-1088. Where should I keep my medicine? Keep out of the reach  of children. Store at room temperature below 30 degrees C (86 degrees F). Protect from light. Keep container tightly closed. Throw away any unused medicine after the expiration date. NOTE: This sheet is a summary. It may not cover all possible information. If you have questions about this medicine, talk to your doctor, pharmacist, or health care provider.  2018 Elsevier/Gold Standard (2010-02-10 18:06:11)  

## 2017-12-20 NOTE — Progress Notes (Signed)
Patient: Tammie Bush Female    DOB: 10/16/1970   47 y.o.   MRN: [id number] Visit Date: 12/20/2017  Today's Provider: Margaretann Loveless, PA-C   Chief Complaint  Patient presents with  . Follow-up    HTN   Subjective:    HPI  Hypertension, follow-up:  BP Readings from Last 3 Encounters:  12/20/17 140/90  12/06/17 (!) 118/96  08/01/17 (!) 120/94    She was last seen for hypertension 2 weeks ago.  BP at that visit was 118/96. Management since that visit includes Continue HCTZ 12.5mg  and will add amlodipine 5mg .  She reports excellent compliance with treatment. She is not having side effects.  She is exercising. She is adherent to low salt diet.   Outside blood pressures are n/a. She is experiencing fatigue and lightheadedness.Some Visual disturbances.  Patient denies chest pain, chest pressure/discomfort, claudication, dyspnea, exertional chest pressure/discomfort, irregular heart beat, lower extremity edema, near-syncope and palpitations.   Cardiovascular risk factors include dyslipidemia, hypertension and obesity (BMI >= 30 kg/m2). .     Weight trend: stable Wt Readings from Last 3 Encounters:  12/20/17 227 lb 6.4 oz (103.1 kg)  12/06/17 221 lb (100.2 kg)  08/01/17 226 lb (102.5 kg)    Current diet: well balanced  ------------------------------------------------------------------------     Allergies  Allergen Reactions  . Sulfamethoxazole-Trimethoprim     rash     Current Outpatient Medications:  .  amLODipine (NORVASC) 5 MG tablet, Take 1 tablet (5 mg total) by mouth daily., Disp: 30 tablet, Rfl: 1 .  aspirin 81 MG chewable tablet, Chew by mouth daily., Disp: , Rfl:  .  hydrochlorothiazide (MICROZIDE) 12.5 MG capsule, TAKE 1 CAPSULE (12.5 MG TOTAL) BY MOUTH DAILY., Disp: 90 capsule, Rfl: 3 .  meclizine (ANTIVERT) 25 MG tablet, Take 1 tablet (25 mg total) by mouth 3 (three) times daily as needed for dizziness., Disp: 30 tablet, Rfl: 0 .   meloxicam (MOBIC) 15 MG tablet, Take 1 tablet (15 mg total) by mouth daily., Disp: 60 tablet, Rfl: 1 .  Omega-3 Fatty Acids (FISH OIL) 1200 MG CAPS, Take 1 capsule by mouth daily., Disp: , Rfl:  .  Sennosides-Docusate Sodium (SENNA-DOCUSATE SODIUM PO), Take by mouth., Disp: , Rfl:   Review of Systems  Constitutional: Positive for fatigue.  Cardiovascular: Negative for chest pain, palpitations and leg swelling.  Neurological: Positive for dizziness ("sometimes") and headaches ("a little"). Light-headedness: "through out the day"    Social History   Tobacco Use  . Smoking status: Never Smoker  . Smokeless tobacco: Never Used  Substance Use Topics  . Alcohol use: No    Alcohol/week: 0.0 oz   Objective:   BP 140/90 (BP Location: Left Arm, Patient Position: Sitting, Cuff Size: Normal)   Pulse 89   Temp 98.5 F (36.9 C) (Oral)   Resp 16   Ht 5\' 6"  (1.676 m)   Wt 227 lb 6.4 oz (103.1 kg)   BMI 36.70 kg/m    Physical Exam  Constitutional: She appears well-developed and well-nourished. No distress.  HENT:  Mouth/Throat:    Neck: Normal range of motion. Neck supple.  Cardiovascular: Normal rate, regular rhythm and normal heart sounds. Exam reveals no gallop and no friction rub.  No murmur heard. Pulmonary/Chest: Effort normal and breath sounds normal. No respiratory distress. She has no wheezes. She has no rales.  Skin: She is not diaphoretic.  Psychiatric: She has a normal mood and affect. Her behavior is  normal. Judgment and thought content normal.  Vitals reviewed.       Assessment & Plan:     1. Essential hypertension Improved. Continue current medication of amlodipine 5mg  and HCTZ 12.5mg . I will see her back in 4 weeks.  - amLODipine (NORVASC) 5 MG tablet; Take 1 tablet (5 mg total) by mouth daily.  Dispense: 90 tablet; Refill: 1  2. GAD (generalized anxiety disorder) Suspect anxiety as root cause. More stress at work and son getting ready to graduate HS and go into  the [employer]. Will try Buspar as below. I will see her back in 4 weeks to see how she is doing.  - busPIRone (BUSPAR) 10 MG tablet; Take 1 tablet (10 mg total) by mouth 2 (two) times daily.  Dispense: 60 tablet; Refill: 1  3. 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  - valACYclovir (VALTREX) 1000 MG tablet; Take 2 tablets (2,000 mg total) by mouth daily. Prn at onset of cold sore  Dispense: 20 tablet; Refill: 0       Margaretann LovelessJennifer M Burnette, PA-C  [facility]

## 2017-12-25 ENCOUNTER — Ambulatory Visit: Payer: Self-pay | Admitting: Physician Assistant

## 2018-01-01 ENCOUNTER — Ambulatory Visit
Admission: RE | Admit: 2018-01-01 | Discharge: 2018-01-01 | Disposition: A | Discharge status: Home / Self Care | Payer: BC Managed Care – PPO | Source: Ambulatory Visit | Attending: Physician Assistant | Admitting: Physician Assistant

## 2018-01-01 DIAGNOSIS — Z1231 Encounter for screening mammogram for malignant neoplasm of breast: Secondary | ICD-10-CM | POA: Diagnosis not present

## 2018-01-02 ENCOUNTER — Telehealth: Payer: Self-pay

## 2018-01-02 NOTE — Telephone Encounter (Signed)
Viewed by Gardiner Ramusonya O Marrs on 01/02/2018 10:23 AM

## 2018-01-02 NOTE — Telephone Encounter (Signed)
-----   Message from Margaretann LovelessJennifer M Burnette, PA-C sent at 01/02/2018  9:34 AM EDT ----- Normal mammogram. Repeat screening in one year.

## 2018-01-09 ENCOUNTER — Other Ambulatory Visit: Payer: Self-pay | Admitting: Physician Assistant

## 2018-01-09 DIAGNOSIS — R42 Dizziness and giddiness: Secondary | ICD-10-CM

## 2018-01-11 ENCOUNTER — Encounter: Payer: Self-pay | Admitting: Certified Nurse Midwife

## 2018-01-11 ENCOUNTER — Ambulatory Visit (INDEPENDENT_AMBULATORY_CARE_PROVIDER_SITE_OTHER): Payer: BC Managed Care – PPO | Admitting: Certified Nurse Midwife

## 2018-01-11 VITALS — BP 114/76 | HR 65 | Ht 66.0 in | Wt 226.0 lb

## 2018-01-11 DIAGNOSIS — Z124 Encounter for screening for malignant neoplasm of cervix: Secondary | ICD-10-CM

## 2018-01-11 DIAGNOSIS — Z01419 Encounter for gynecological examination (general) (routine) without abnormal findings: Secondary | ICD-10-CM

## 2018-01-11 NOTE — Progress Notes (Signed)
Gynecology Annual Exam  PCP: Margaretann LovelessBurnette, Jennifer M, PA-C  Chief Complaint:  Chief Complaint  Patient presents with  . Gynecologic Exam    History of Present Illness: Patient is a 47 y.o. [id number] presents for annual exam. The patient has no complaints today. She has a history of a LSH for menorrhagia in 2012. Has not had any vaginal bleeding. She has an occasional hot flash, and normal FSH and LH 11/2017. Her past medical history is notable for hypertension, benign postural vertigo,obesity and T2DM. Since her last annual exam 11/10/2016, she has had a laproscopic cholecystectomy (10/11/2017). Her last Pap smear was 11/10/2016 and was NI The patient does perform occasional  monthly self breast exams.  There is notable family history of breast cancer in her sister at age 47. Genetic testing has not been done. There is no history of  ovarian cancer in her family. She does not smoke. She does not drink alcohol. She exercises by doing yardwork.. Current BMI=36.48 kg/m2. She does get adequate calcium in her diet.  Her routine screening labs are managed by her PCP. Her cholesterol screen in 2019 was normal.   Review of Systems: Review of Systems  Constitutional: Positive for malaise/fatigue. Negative for chills, fever and weight loss.       Positive for weight gain (7#)  HENT: Negative for congestion, sinus pain and sore throat.   Eyes: Negative for blurred vision and pain.  Respiratory: Negative for hemoptysis, shortness of breath and wheezing.   Cardiovascular: Negative for chest pain, palpitations and leg swelling.  Gastrointestinal: Negative for abdominal pain, blood in stool, diarrhea, heartburn, nausea and vomiting.  Genitourinary: Negative for dysuria, frequency, hematuria and urgency.  Musculoskeletal: Negative for back pain, joint pain and myalgias.  Skin: Negative for itching and rash.  Neurological: Negative for dizziness, tingling and headaches.  Endo/Heme/Allergies: Negative  for environmental allergies and polydipsia. Does not bruise/bleed easily.       Negative for hirsutism   Psychiatric/Behavioral: Negative for depression. The patient is not nervous/anxious and does not have insomnia.     Past Medical History:  Past Medical History:  Diagnosis Date  . Anxiety   . Diabetes mellitus, type II (HCC)   . Dysmenorrhea   . Hypertension   . Hypothyroidism   . Menorrhagia   . Morbid obesity (HCC)   . Vertigo     Past Surgical History:  Past Surgical History:  Procedure Laterality Date  . ABDOMINAL HYSTERECTOMY    . CHOLECYSTECTOMY  09/2017   [facility]  . CRYOABLATION  08/24/2010   [facility]  [facility]   men[facility]ag[facility]  . MASTOID DEBRIDEMENT     age 89  . OVARIAN CYST REMOVAL  04/13/2011  . TONSILLECTOMY  1977  . TUBAL LIGATION  2001   postpartum  . WISDOM TOOTH EXTRACTION        Family History:  Family History  Problem Relation Age of Onset  . Diabetes Mother   . Hypertension Mother   . Breast cancer Sister 3846  . Heart disease Maternal Grandmother   . Lung cancer Maternal Grandfather     Social History:  Social History   Socioeconomic History  . Marital status: Legally Separated    Spouse name: Not on file  . Number of children: 4  . Years of education: Not on file  . Highest education level: Not on file  Occupational History  . 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 Financial resource strain: Not  on file  . Food insecurity:    Worry: Not on file    Inability: Not on file  . Transportation needs:    Medical: Not on file    Non-medical: Not on file  Tobacco Use  . Smoking status: Never Smoker  . Smokeless tobacco: Never Used  Substance and Sexual Activity  . Alcohol use: No    Alcohol/week: 0.0 oz  . Drug use: No  . Sexual activity: Yes    Birth control/protection: Surgical  Lifestyle  . Physical activity:    Days per week: 0 days    Minutes per session: 0 min  . Stress: Not at all  Relationships  . Social  connections:    Talks on phone: Not on file    Gets together: Not on file    Attends religious service: Not on file    Active member of club or organization: Not on file    Attends meetings of clubs or organizations: Not on file    Relationship status: Not on file  . Intimate partner violence:    Fear of current or ex partner: Not on file    Emotionally abused: Not on file    Physically abused: Not on file    Forced sexual activity: Not on file  Other Topics Concern  . Not on file  Social History Narrative  . Not on file    Allergies:  Allergies  Allergen Reactions  . Sulfamethoxazole-Trimethoprim     rash    Medications:Physical Exam Vitals: BP 114/76   Pulse 65   Ht 5\' 6"  (1.676 m)   Wt 226 lb (102.5 kg)   BMI 36.48 kg/m   General: pleasant WF, in NAD HEENT: normocephalic, anicteric Thyroid: no enlargement, no palpable nodules Pulmonary: No increased work of breathing, CTAB Cardiovascular: RRR without murmur Breast: Breast symmetrical, no tenderness, no palpable nodules or masses, no skin or nipple retraction present, no nipple discharge.  No axillary, infraclavicular, or supraclavicular lymphadenopathy. Abdomen: obese, soft, non-tender, non-distended.  Umbilicus without lesions.  No hepatomegaly or masses palpable. No evidence of hernia  Genitourinary:  External: Normal external female genitalia.  Normal urethral meatus, normal Bartholin's and Skene's glands.    Vagina: Normal vaginal mucosa, rectocele present    Cervix: Grossly normal in appearance, no bleeding  Uterus: surgically absent  Adnexa: no adnexal masses, NT  Rectal: deferred  Lymphatic: no evidence of inguinal lymphadenopathy Extremities: no edema, erythema, or tenderness Neurologic: Grossly intact Psychiatric: mood appropriate, affect full   Assessment: 47 y.o. [id number] well woman exam Family history of breast cancer in her sister   Plan:   1) Mammogram - recommend yearly screening mammogram.   Mammogram is up to date. Has been  offered genetic testing due to family history of breast cancer in the past and patient declined. Did not address this issue this year.  2) Pap smear done. Desires annual Pap smears  3) Routine healthcare maintenance including cholesterol, diabetes screening  managed by PCP   4) RTO 1 year for Pap smear.   Farrel Conners, CNM

## 2018-01-16 LAB — IGP, APTIMA HPV
HPV Aptima: NEGATIVE
PAP Smear Comment: 0

## 2018-01-22 ENCOUNTER — Encounter: Payer: Self-pay | Admitting: Certified Nurse Midwife

## 2018-01-24 ENCOUNTER — Ambulatory Visit: Payer: Self-pay | Admitting: Physician Assistant

## 2018-02-19 ENCOUNTER — Other Ambulatory Visit: Payer: Self-pay | Admitting: Physician Assistant

## 2018-02-19 DIAGNOSIS — F411 Generalized anxiety disorder: Secondary | ICD-10-CM

## 2018-02-28 ENCOUNTER — Other Ambulatory Visit: Payer: Self-pay | Admitting: Physician Assistant

## 2018-02-28 DIAGNOSIS — R42 Dizziness and giddiness: Secondary | ICD-10-CM

## 2018-04-16 ENCOUNTER — Other Ambulatory Visit: Payer: Self-pay | Admitting: Physician Assistant

## 2018-04-16 DIAGNOSIS — F411 Generalized anxiety disorder: Secondary | ICD-10-CM

## 2018-04-23 ENCOUNTER — Encounter: Payer: Self-pay | Admitting: Physician Assistant

## 2018-04-23 ENCOUNTER — Ambulatory Visit: Payer: BC Managed Care – PPO | Admitting: Physician Assistant

## 2018-04-23 VITALS — BP 128/88 | HR 75 | Temp 97.6°F | Wt 228.2 lb

## 2018-04-23 DIAGNOSIS — L304 Erythema intertrigo: Secondary | ICD-10-CM

## 2018-04-23 MED ORDER — NYSTATIN-TRIAMCINOLONE 100000-0.1 UNIT/GM-% EX OINT: 1.0000 "application " | TOPICAL_OINTMENT | Freq: Two times a day (BID) | CUTANEOUS | 0 refills | Status: DC

## 2018-04-23 NOTE — Progress Notes (Signed)
       Patient: Tammie Bush Female    DOB: [birth year]   47 y.o.   MRN: [id number] Visit Date: 04/23/2018  Today's Provider: Margaretann Loveless, PA-C   Chief Complaint  Patient presents with  . Rash   Subjective:       Rash  This is a new problem. The current episode started 1 to 4 weeks ago (2 weeks). The problem has been gradually worsening since onset. The affected locations include the chest (Under right breast). The rash is characterized by itchiness and redness. She was exposed to nothing. Past treatments include nothing. The treatment provided no relief.      Allergies  Allergen Reactions  . Sulfamethoxazole-Trimethoprim     rash     Current Outpatient Medications:  .  amLODipine (NORVASC) 5 MG tablet, Take 1 tablet (5 mg total) by mouth daily., Disp: 90 tablet, Rfl: 1 .  aspirin 81 MG chewable tablet, Chew by mouth daily., Disp: , Rfl:  .  busPIRone (BUSPAR) 10 MG tablet, TAKE 1 TABLET BY MOUTH TWICE A DAY, Disp: 180 tablet, Rfl: 1 .  hydrochlorothiazide (MICROZIDE) 12.5 MG capsule, TAKE 1 CAPSULE (12.5 MG TOTAL) BY MOUTH DAILY., Disp: 90 capsule, Rfl: 3 .  meclizine (ANTIVERT) 25 MG tablet, TAKE 1 TABLET (25 MG TOTAL) BY MOUTH 3 (THREE) TIMES DAILY AS NEEDED FOR DIZZINESS., Disp: 30 tablet, Rfl: 0 .  meloxicam (MOBIC) 15 MG tablet, , Disp: , Rfl:  .  Omega-3 Fatty Acids (FISH OIL) 1200 MG CAPS, Take 1 capsule by mouth daily., Disp: , Rfl:  .  Sennosides-Docusate Sodium (SENNA-DOCUSATE SODIUM PO), Take by mouth., Disp: , Rfl:   Review of Systems  Constitutional: Negative.   Respiratory: Negative.   Cardiovascular: Negative.   Musculoskeletal: Negative.   Skin: Positive for rash.    Social History   Tobacco Use  . Smoking status: Never Smoker  . Smokeless tobacco: Never Used  Substance Use Topics  . Alcohol use: No    Alcohol/week: 0.0 standard drinks   Objective:   BP 128/88 (BP Location: Left Arm, Patient Position: Sitting, Cuff Size: Normal)    Pulse 75   Temp 97.6 F (36.4 C) (Oral)   Wt 228 lb 3.2 oz (103.5 kg)   SpO2 97%   BMI 36.83 kg/m  Vitals:   04/23/18 1427  BP: 128/88  Pulse: 75  Temp: 97.6 F (36.4 C)  TempSrc: Oral  SpO2: 97%  Weight: 228 lb 3.2 oz (103.5 kg)     Physical Exam  Constitutional: She appears well-developed and well-nourished. No distress.  Neck: Normal range of motion. Neck supple.  Cardiovascular: Normal rate, regular rhythm and normal heart sounds. Exam reveals no gallop and no friction rub.  No murmur heard. Pulmonary/Chest: Effort normal and breath sounds normal. No respiratory distress. She has no wheezes. She has no rales.  Skin: Rash noted. Rash is papular (on erythematous base with well demarcated irregular border). She is not diaphoretic.  Vitals reviewed.       Assessment & Plan:     1. Intertrigo Discussed keeping area dry and clean. Mycolog ointment sent in for patient to apply twice daily for 5-7 days until clear. Call if not improving.  - nystatin-triamcinolone ointment (MYCOLOG); Apply 1 application topically 2 (two) times daily.  Dispense: 30 g; Refill: 0       Margaretann Loveless, PA-C  [facility]

## 2018-04-23 NOTE — Patient Instructions (Signed)
Intertrigo Intertrigo is skin irritation or inflammation (dermatitis) that occurs when folds of skin rub together. The irritation can cause a rash and make skin raw and itchy. This condition most commonly occurs in the skin folds of these areas:  Toes.  Armpits.  Groin.  Belly.  Breasts.  Buttocks.  Intertrigo is not passed from person to person (is not contagious). What are the causes? This condition is caused by heat, moisture, friction, and lack of air circulation. The condition can be made worse by:  Sweat.  Bacteria or a fungus, such as yeast.  What increases the risk? This condition is more likely to occur if you have moisture in your skin folds. It is also more likely to develop in people who:  Have diabetes.  Are overweight.  Are on bed rest.  Live in a warm and moist climate.  Wear splints, braces, or other medical devices.  Are not able to control their bowels or bladder (have incontinence).  What are the signs or symptoms? Symptoms of this condition include:  A pink or red skin rash.  Brown patches on the skin.  Raw or scaly skin.  Itchiness.  A burning feeling.  Bleeding.  Leaking fluid.  A bad smell.  How is this diagnosed? This condition is diagnosed with a medical history and physical exam. You may also have a skin swab to test for bacteria or a fungus, such as yeast. How is this treated? Treatment may include:  Cleaning and drying your skin.  An oral antibiotic medicine or antibiotic skin cream for a bacterial infection.  Antifungal cream or pills for an infection that was caused by a fungus, such as yeast.  Steroid ointment to relieve itchiness and irritation.  Follow these instructions at home:  Keep the affected area clean and dry.  Do not scratch your skin.  Stay in a cool environment as much as possible. Use an air conditioner or fan, if available.  Apply over-the-counter and prescription medicines only as told by your  health care provider.  If you were prescribed an antibiotic medicine, use it as told by your health care provider. Do not stop using the antibiotic even if your condition improves.  Keep all follow-up visits as told by your health care provider. This is important. How is this prevented?  Maintain a healthy weight.  Take care of your feet, especially if you have diabetes. Foot care includes: ? Wearing shoes that fit well. ? Keeping your feet dry. ? Wearing clean, breathable socks.  Protect the skin around your groin and buttocks, especially if you have incontinence. Skin protection includes: ? Following a regular cleaning routine. ? Using moisturizers and skin protectants. ? Changing protection pads frequently.  Do not wear tight clothes. Wear clothes that are loose and absorbent. Wear clothes that are made of cotton.  Wear a bra that gives good support, if needed.  Shower and dry yourself thoroughly after activity. Use a hair dryer on a cool setting to dry between skin folds, especially after you bathe.  If you have diabetes, keep your blood sugar under control. Contact a health care provider if:  Your symptoms do not improve with treatment.  Your symptoms get worse or they spread.  You notice increased redness and warmth.  You have a fever. This information is not intended to replace advice given to you by your health care provider. Make sure you discuss any questions you have with your health care provider. Document Released: 07/03/2005 Document Revised: 12/09/2015   Document Reviewed: 01/04/2015 Elsevier Interactive Patient Education  2018 Elsevier Inc.  

## 2018-04-26 ENCOUNTER — Encounter: Payer: Self-pay | Admitting: Physician Assistant

## 2018-04-26 DIAGNOSIS — L309 Dermatitis, unspecified: Secondary | ICD-10-CM

## 2018-04-26 MED ORDER — PREDNISONE 10 MG (21) PO TBPK: ORAL_TABLET | ORAL | 0 refills | Status: DC

## 2018-05-02 LAB — HM COLONOSCOPY

## 2018-05-06 ENCOUNTER — Encounter: Payer: Self-pay | Admitting: Physician Assistant

## 2018-05-06 DIAGNOSIS — L304 Erythema intertrigo: Secondary | ICD-10-CM

## 2018-05-06 MED ORDER — CLOTRIMAZOLE-BETAMETHASONE 1-0.05 % EX CREA: 1.0000 "application " | TOPICAL_CREAM | Freq: Two times a day (BID) | CUTANEOUS | 0 refills | Status: DC

## 2018-05-29 ENCOUNTER — Other Ambulatory Visit: Payer: Self-pay | Admitting: Physician Assistant

## 2018-05-29 DIAGNOSIS — I10 Essential (primary) hypertension: Secondary | ICD-10-CM

## 2018-06-02 ENCOUNTER — Encounter: Payer: Self-pay | Admitting: Physician Assistant

## 2018-06-04 ENCOUNTER — Other Ambulatory Visit: Payer: Self-pay

## 2018-06-04 MED ORDER — MELOXICAM 15 MG PO TABS: 15.0000 mg | ORAL_TABLET | Freq: Every day | ORAL | 1 refills | Status: DC

## 2018-06-04 NOTE — Telephone Encounter (Signed)
Pharmacy refill request for Meloxicam 15mg   Per Dr. Evans verbal order, ok to refill.   Script has been sent to pharmacy 

## 2018-06-12 ENCOUNTER — Encounter: Payer: Self-pay | Admitting: Physician Assistant

## 2018-06-12 ENCOUNTER — Ambulatory Visit: Payer: BC Managed Care – PPO | Admitting: Physician Assistant

## 2018-06-12 VITALS — BP 118/70 | HR 90 | Temp 97.9°F | Resp 16 | Wt 232.0 lb

## 2018-06-12 DIAGNOSIS — I10 Essential (primary) hypertension: Secondary | ICD-10-CM | POA: Diagnosis not present

## 2018-06-12 DIAGNOSIS — R7309 Other abnormal glucose: Secondary | ICD-10-CM

## 2018-06-12 DIAGNOSIS — E7800 Pure hypercholesterolemia, unspecified: Secondary | ICD-10-CM

## 2018-06-12 DIAGNOSIS — M722 Plantar fascial fibromatosis: Secondary | ICD-10-CM | POA: Insufficient documentation

## 2018-06-12 DIAGNOSIS — M7730 Calcaneal spur, unspecified foot: Secondary | ICD-10-CM | POA: Insufficient documentation

## 2018-06-12 DIAGNOSIS — E039 Hypothyroidism, unspecified: Secondary | ICD-10-CM | POA: Diagnosis not present

## 2018-06-12 NOTE — Progress Notes (Signed)
Patient: Tammie Bush Female    DOB: 19-Jan-1971   47 y.o.   MRN: [id number] Visit Date: 06/12/2018  Today's Provider: Margaretann Loveless, PA-C   Chief Complaint  Patient presents with  . Follow-up    Hypertension   Subjective:    HPI  Hypertension, follow-up:  BP Readings from Last 3 Encounters:  06/12/18 118/70  04/23/18 128/88  01/11/18 114/76    She was last seen for hypertension 6 months ago.  BP at that visit was 140/90 Management since that visit includes continue amlodipine 5mg  and HCTZ 12.5mg . She reports excellent compliance with treatment. She is not having side effects.  She is not exercising. She is adherent to low salt diet.   Outside blood pressures are n/a. She is experiencing none.  Patient denies chest pain, chest pressure/discomfort, exertional chest pressure/discomfort, fatigue, irregular heart beat, lower extremity edema, near-syncope and palpitations.   Cardiovascular risk factors include diabetes mellitus, dyslipidemia and hypertension.     Weight trend: stable Wt Readings from Last 3 Encounters:  06/12/18 232 lb (105.2 kg)  04/23/18 228 lb 3.2 oz (103.5 kg)  01/11/18 226 lb (102.5 kg)    Current diet: in general, a "healthy" diet    ------------------------------------------------------------------------   Anxiety. Patient reports she never took the Buspar for the anxiety. She reports that she is doing better with the symptoms.   Patient reports that she was on Meloxicam and reports that when she had the endoscopy with Dr Orvan Falconer she was advised to stop taking the Meloxicam and baby Aspirin until her stomach heals. She reports that Dr.Evans had put her on the Meloxicam for the heel spur. She reports that her heel spur pain is worsening and wants to know what else to take. She reports she is seeing Dr.Evans 06/28/18.    Allergies  Allergen Reactions  . Sulfamethoxazole-Trimethoprim     rash     Current Outpatient  Medications:  .  amLODipine (NORVASC) 5 MG tablet, TAKE 1 TABLET BY MOUTH EVERY DAY, Disp: 90 tablet, Rfl: 1 .  diazepam (VALIUM) 5 MG tablet, Take by mouth., Disp: , Rfl:  .  hydrochlorothiazide (MICROZIDE) 12.5 MG capsule, TAKE 1 CAPSULE (12.5 MG TOTAL) BY MOUTH DAILY., Disp: 90 capsule, Rfl: 3 .  omeprazole (PRILOSEC) 20 MG capsule, Take 20 mg by mouth 2 (two) times daily. , Disp: , Rfl:  .  aspirin 81 MG chewable tablet, Chew by mouth daily., Disp: , Rfl:  .  busPIRone (BUSPAR) 10 MG tablet, TAKE 1 TABLET BY MOUTH TWICE A DAY (Patient not taking: Reported on 06/12/2018), Disp: 180 tablet, Rfl: 1 .  clotrimazole-betamethasone (LOTRISONE) cream, Apply 1 application topically 2 (two) times daily. (Patient not taking: Reported on 06/12/2018), Disp: 30 g, Rfl: 0 .  meclizine (ANTIVERT) 25 MG tablet, TAKE 1 TABLET (25 MG TOTAL) BY MOUTH 3 (THREE) TIMES DAILY AS NEEDED FOR DIZZINESS. (Patient not taking: Reported on 06/12/2018), Disp: 30 tablet, Rfl: 0 .  meloxicam (MOBIC) 15 MG tablet, Take 1 tablet (15 mg total) by mouth daily. (Patient not taking: Reported on 06/12/2018), Disp: 90 tablet, Rfl: 1 .  Omega-3 Fatty Acids (FISH OIL) 1200 MG CAPS, Take 1 capsule by mouth daily., Disp: , Rfl:  .  predniSONE (STERAPRED UNI-PAK 21 TAB) 10 MG (21) TBPK tablet, 6 day taper; take as directed on package instructions (Patient not taking: Reported on 06/12/2018), Disp: 21 tablet, Rfl: 0 .  Sennosides-Docusate Sodium (SENNA-DOCUSATE SODIUM PO), Take by mouth.,  Disp: , Rfl:   Review of Systems  Constitutional: Negative.   Respiratory: Negative.   Cardiovascular: Negative for chest pain, palpitations and leg swelling.  Gastrointestinal: Negative.   Musculoskeletal: Positive for arthralgias and gait problem.    Social History   Tobacco Use  . Smoking status: Never Smoker  . Smokeless tobacco: Never Used  Substance Use Topics  . Alcohol use: No    Alcohol/week: 0.0 standard drinks   Objective:   BP  118/70 (BP Location: Left Arm, Patient Position: Sitting, Cuff Size: Large)   Pulse 90   Temp 97.9 F (36.6 C) (Oral)   Resp 16   Wt 232 lb (105.2 kg)   BMI 37.45 kg/m  Vitals:   06/12/18 1157  BP: 118/70  Pulse: 90  Resp: 16  Temp: 97.9 F (36.6 C)  TempSrc: Oral  Weight: 232 lb (105.2 kg)     Physical Exam  Constitutional: She appears well-developed and well-nourished. No distress.  Neck: Normal range of motion. Neck supple. No tracheal deviation present. No thyromegaly present.  Cardiovascular: Normal rate, regular rhythm and normal heart sounds. Exam reveals no gallop and no friction rub.  No murmur heard. Pulmonary/Chest: Effort normal and breath sounds normal. No respiratory distress. She has no wheezes. She has no rales.  Musculoskeletal: She exhibits no edema.  Lymphadenopathy:    She has no cervical adenopathy.  Skin: She is not diaphoretic.  Vitals reviewed.      Assessment & Plan:     1. Essential hypertension Stable. Continue amlodipine 5mg  and HCTZ 12.5mg . Will check labs as below and f/u pending results. - CBC with Differential/Platelet - Comprehensive metabolic panel  2. Elevated hemoglobin A1c Diet controlled. Will check labs as below and f/u pending results. - Hemoglobin A1c  3. Hypercholesterolemia Diet controlled. Will check labs as below and f/u pending results. - Lipid panel  4. Hypothyroidism, unspecified type Asymptomatic. Will check labs as below and f/u pending results. - TSH  5. Calcaneal spur, unspecified laterality Worsening. Patient unable to take NSAIDs due to an NSAID induced gastritis. Taking tylenol currently. Discussed adding heel pad to shoe. Rest during holiday week while off work. Has appt with Dr. Logan BoresEvans on 06/28/18 for consideration of another steroid injection.   6. Plantar fasciitis, bilateral See above medical treatment plan.       Margaretann LovelessJennifer M Burnette, PA-C  [facility]

## 2018-06-13 LAB — COMPREHENSIVE METABOLIC PANEL
ALK PHOS: 83 IU/L (ref 39–117)
ALT: 18 IU/L (ref 0–32)
AST: 15 IU/L (ref 0–40)
Albumin/Globulin Ratio: 1.5 (ref 1.2–2.2)
Albumin: 4.2 g/dL (ref 3.5–5.5)
BUN/Creatinine Ratio: 16 (ref 9–23)
BUN: 12 mg/dL (ref 6–24)
Bilirubin Total: 0.4 mg/dL (ref 0.0–1.2)
CO2: 22 mmol/L (ref 20–29)
CREATININE: 0.76 mg/dL (ref 0.57–1.00)
Calcium: 9.5 mg/dL (ref 8.7–10.2)
Chloride: 97 mmol/L (ref 96–106)
GFR calc Af Amer: 108 mL/min/{1.73_m2} (ref >59)
GFR calc non Af Amer: 94 mL/min/{1.73_m2} (ref >59)
GLOBULIN, TOTAL: 2.8 g/dL (ref 1.5–4.5)
Glucose: 122 mg/dL — ABNORMAL HIGH (ref 65–99)
Potassium: 3.9 mmol/L (ref 3.5–5.2)
SODIUM: 136 mmol/L (ref 134–144)
Total Protein: 7 g/dL (ref 6.0–8.5)

## 2018-06-13 LAB — CBC WITH DIFFERENTIAL/PLATELET
BASOS: 1 %
Basophils Absolute: 0.1 10*3/uL (ref 0.0–0.2)
EOS (ABSOLUTE): 0.4 10*3/uL (ref 0.0–0.4)
EOS: 4 %
HEMATOCRIT: 40.8 % (ref 34.0–46.6)
HEMOGLOBIN: 14.1 g/dL (ref 11.1–15.9)
IMMATURE GRANS (ABS): 0.1 10*3/uL (ref 0.0–0.1)
IMMATURE GRANULOCYTES: 1 %
Lymphocytes Absolute: 3.5 10*3/uL — ABNORMAL HIGH (ref 0.7–3.1)
Lymphs: 32 %
MCH: 28.4 pg (ref 26.6–33.0)
MCHC: 34.6 g/dL (ref 31.5–35.7)
MCV: 82 fL (ref 79–97)
MONOCYTES: 6 %
Monocytes Absolute: 0.6 10*3/uL (ref 0.1–0.9)
Neutrophils Absolute: 6.4 10*3/uL (ref 1.4–7.0)
Neutrophils: 56 %
Platelets: 365 10*3/uL (ref 150–450)
RBC: 4.96 x10E6/uL (ref 3.77–5.28)
RDW: 13.5 % (ref 12.3–15.4)
WBC: 11.1 10*3/uL — ABNORMAL HIGH (ref 3.4–10.8)

## 2018-06-13 LAB — LIPID PANEL
CHOL/HDL RATIO: 5.1 ratio — AB (ref 0.0–4.4)
CHOLESTEROL TOTAL: 230 mg/dL — AB (ref 100–199)
HDL: 45 mg/dL (ref >39)
LDL CALC: 150 mg/dL — AB (ref 0–99)
Triglycerides: 177 mg/dL — ABNORMAL HIGH (ref 0–149)
VLDL CHOLESTEROL CAL: 35 mg/dL (ref 5–40)

## 2018-06-13 LAB — HEMOGLOBIN A1C
Est. average glucose Bld gHb Est-mCnc: 157 mg/dL
Hgb A1c MFr Bld: 7.1 % — ABNORMAL HIGH (ref 4.8–5.6)

## 2018-06-13 LAB — TSH: TSH: 4.12 u[IU]/mL (ref 0.450–4.500)

## 2018-06-14 ENCOUNTER — Encounter: Payer: Self-pay | Admitting: Physician Assistant

## 2018-06-14 DIAGNOSIS — E119 Type 2 diabetes mellitus without complications: Secondary | ICD-10-CM

## 2018-06-17 ENCOUNTER — Telehealth: Payer: Self-pay

## 2018-06-17 DIAGNOSIS — R42 Dizziness and giddiness: Secondary | ICD-10-CM

## 2018-06-17 MED ORDER — MECLIZINE HCL 25 MG PO TABS: 25.0000 mg | ORAL_TABLET | Freq: Three times a day (TID) | ORAL | 0 refills | Status: DC | PRN

## 2018-06-17 MED ORDER — METFORMIN HCL 500 MG PO TABS: 500.0000 mg | ORAL_TABLET | Freq: Every day | ORAL | 1 refills | Status: DC

## 2018-06-17 NOTE — Telephone Encounter (Signed)
Viewed by Gardiner Ramusonya O Egnor on 06/17/2018 12:55 PM

## 2018-06-17 NOTE — Addendum Note (Signed)
Addended by: Margaretann LovelessBURNETTE, JENNIFER M on: 06/17/2018 09:27 AM   Modules accepted: Orders

## 2018-06-17 NOTE — Telephone Encounter (Signed)
Called patient this morning to ask how she is doing. Patient called on 06/14/18 after hours, and was triaged by RN. Patient was having vertigo, cold sweats and vomited. Patient reports symptoms are much better today, patient has been taking Meclizine TID and is requesting a refill. Patient denies any URI, or ear pain. Please advise.

## 2018-06-17 NOTE — Telephone Encounter (Signed)
[email or web address]

## 2018-06-17 NOTE — Telephone Encounter (Signed)
-----   Message from Jennifer M Burnette, PA-C sent at 06/17/2018  7:48 AM EST ----- Blood count does show slight elevation in white blood cells that is consistent with an infection which you have had recently. Kidney and liver function are normal. A1c is up to 7.1. This is the highest it has been in a long time. Would recommend for you to either come in and discuss medications for diabetes, or I can start metformin for you and then see you back in 3 months to recheck A1c. Cholesterol also elevated when compared to the last 3 years. Really need to focus on dietary habits and limit fatty foods, red meats and carbohydrates. Currently your risk of having a cardiovascular event over the next 10 years remains low at 3.5% so no need to start cholesterol lowering medication yet. Thyroid is normal. 

## 2018-06-17 NOTE — Telephone Encounter (Signed)
Sent in refill meclizine. She needs to be seen if vertigo continues.

## 2018-06-18 ENCOUNTER — Telehealth: Payer: Self-pay

## 2018-06-18 NOTE — Telephone Encounter (Signed)
[email or web address] 

## 2018-06-18 NOTE — Telephone Encounter (Signed)
Patient was advised and has already picked up the metformin.

## 2018-06-18 NOTE — Telephone Encounter (Signed)
-----   Message from Margaretann LovelessJennifer M Burnette, [place]PA-C sent at 06/17/2018  7:48 AM EST ----- Blood count does show slight elevation in white blood cells that is consistent with an infection which you have had recently. Kidney and liver function are normal. A1c is up to 7.1. This is the highest it has been in a long time. Would recommend for you to either come in and discuss medications for diabetes, or I can start metformin for you and then see you back in 3 months to recheck A1c. Cholesterol also elevated when compared to the last 3 years. Really need to focus on dietary habits and limit fatty foods, red meats and carbohydrates. Currently your risk of having a cardiovascular event over the next 10 years remains low at 3.5% so no need to start cholesterol lowering medication yet. Thyroid is normal.

## 2018-06-28 ENCOUNTER — Encounter: Payer: Self-pay | Admitting: Podiatry

## 2018-06-28 ENCOUNTER — Ambulatory Visit: Payer: BC Managed Care – PPO | Admitting: Podiatry

## 2018-06-28 ENCOUNTER — Encounter

## 2018-06-28 DIAGNOSIS — M722 Plantar fascial fibromatosis: Secondary | ICD-10-CM

## 2018-07-02 NOTE — Progress Notes (Signed)
   Subjective: 47 year old female with PMHx of T2DM presenting today for follow up evaluation of bilateral plantar fasciitis. She states she is currently having a flare up and the left foot is worse than the right. She states the pain is the same as it was previously. There are no modifying factors noted. Patient is here for further evaluation and treatment.   Past Medical History:  Diagnosis Date  . Anxiety   . Diabetes mellitus, type II (HCC)   . Dysmenorrhea   . Hypertension   . Hypothyroidism   . Menorrhagia   . Morbid obesity (HCC)   . Vertigo      Objective: Physical Exam General: The patient is alert and oriented x3 in no acute distress.  Dermatology: Skin is warm, dry and supple bilateral lower extremities. Negative for open lesions or macerations bilateral.   Vascular: Dorsalis Pedis and Posterior Tibial pulses palpable bilateral.  Capillary fill time is immediate to all digits.  Neurological: Epicritic and protective threshold intact bilateral.   Musculoskeletal: Tenderness to palpation at the medial calcaneal tubercale and through the insertion of the plantar fascia of the bilateral feet. All other joints range of motion within normal limits bilateral. Strength 5/5 in all groups bilateral.   Assessment: 1. plantar fasciitis bilateral feet  Plan of Care:  1. Patient evaluated.   2. Injection of 0.5cc Celestone soluspan injected into the bilateral heels.  3. Cannot take NSAIDs due to GI ulcers. Recommended OTC Tylenol as needed.  4. Continue using custom orthotics.  5. Return to clinic as needed.    Works in [employer].    Felecia ShellingBrent M. Evans, DPM [facility]  Dr. Felecia ShellingBrent M. Evans, DPM    [address]                                   [place], [place] [id number]                Office [phone]  Fax [phone]

## 2018-07-15 ENCOUNTER — Encounter: Payer: Self-pay | Admitting: Physician Assistant

## 2018-07-16 ENCOUNTER — Encounter: Payer: Self-pay | Admitting: Physician Assistant

## 2018-07-16 DIAGNOSIS — R030 Elevated blood-pressure reading, without diagnosis of hypertension: Secondary | ICD-10-CM

## 2018-07-16 MED ORDER — HYDROCHLOROTHIAZIDE 12.5 MG PO CAPS: 12.5000 mg | ORAL_CAPSULE | Freq: Every day | ORAL | 0 refills | Status: DC

## 2018-10-09 ENCOUNTER — Ambulatory Visit (INDEPENDENT_AMBULATORY_CARE_PROVIDER_SITE_OTHER): Payer: BC Managed Care – PPO | Admitting: Physician Assistant

## 2018-10-09 ENCOUNTER — Encounter: Payer: Self-pay | Admitting: Physician Assistant

## 2018-10-09 ENCOUNTER — Ambulatory Visit: Payer: BC Managed Care – PPO | Admitting: Physician Assistant

## 2018-10-09 VITALS — BP 132/93 | HR 79 | Temp 98.2°F | Resp 16

## 2018-10-09 DIAGNOSIS — E119 Type 2 diabetes mellitus without complications: Secondary | ICD-10-CM | POA: Diagnosis not present

## 2018-10-09 DIAGNOSIS — R42 Dizziness and giddiness: Secondary | ICD-10-CM | POA: Diagnosis not present

## 2018-10-09 LAB — POCT GLYCOSYLATED HEMOGLOBIN (HGB A1C): Hemoglobin A1C: 7 % — AB (ref 4.0–5.6)

## 2018-10-09 NOTE — Progress Notes (Signed)
Virtual Visit via Video Note  I connected with Tammie Bush on 10/09/18 at 10:40 AM EDT by a video enabled telemedicine application and verified that I am speaking with the correct person using two identifiers.   I discussed the limitations of evaluation and management by telemedicine and the availability of in person appointments. The patient expressed understanding and agreed to proceed.   Patient: Tammie Bush Female    DOB: 08/02/1970   48 y.o.   MRN: [id number] Visit Date: 10/09/2018  Today's Provider: Margaretann Loveless, PA-C   Chief Complaint  Patient presents with  . Follow-up    diabetes   Subjective:     HPI  Diabetes Mellitus Type II, Follow-up:   Lab Results  Component Value Date   HGBA1C 7.0 (A) 10/09/2018   HGBA1C 7.1 (H) 06/12/2018   HGBA1C 6.3 (H) 05/07/2017    Last seen for diabetes 4 months ago.  Management since then includes check A1c  She reports excellent compliance with treatment. She is not having side effects.  Current symptoms include none and have been stable. Home blood sugar records: fasting range: not being checked  Episodes of hypoglycemia? no   Current Insulin Regimen: none Most Recent Eye Exam:  Weight trend: stable Prior visit with dietician: No Current exercise: walking Current diet habits: not asked  Pertinent Labs:    Component Value Date/Time   CHOL 230 (H) 06/12/2018 1219   TRIG 177 (H) 06/12/2018 1219   HDL 45 06/12/2018 1219   LDLCALC 150 (H) 06/12/2018 1219   LDLCALC 126 (H) 05/07/2017 1652   CREATININE 0.76 06/12/2018 1219   CREATININE 0.77 05/07/2017 1652    Wt Readings from Last 3 Encounters:  06/12/18 232 lb (105.2 kg)  04/23/18 228 lb 3.2 oz (103.5 kg)  01/11/18 226 lb (102.5 kg)    ------------------------------------------------------------------------  Allergies  Allergen Reactions  . Sulfamethoxazole-Trimethoprim     rash     Current Outpatient Medications:  .  amLODipine (NORVASC) 5  MG tablet, TAKE 1 TABLET BY MOUTH EVERY DAY, Disp: 90 tablet, Rfl: 1 .  hydrochlorothiazide (MICROZIDE) 12.5 MG capsule, Take 1 capsule (12.5 mg total) by mouth daily., Disp: 90 capsule, Rfl: 0 .  meclizine (ANTIVERT) 25 MG tablet, Take 1 tablet (25 mg total) by mouth 3 (three) times daily as needed for dizziness., Disp: 30 tablet, Rfl: 0 .  metFORMIN (GLUCOPHAGE) 500 MG tablet, Take 1 tablet (500 mg total) by mouth daily with breakfast., Disp: 90 tablet, Rfl: 1 .  Omega-3 Fatty Acids (FISH OIL) 1200 MG CAPS, Take 1 capsule by mouth daily., Disp: , Rfl:  .  Sennosides-Docusate Sodium (SENNA-DOCUSATE SODIUM PO), Take by mouth as needed. , Disp: , Rfl:  .  omeprazole (PRILOSEC) 20 MG capsule, Take 20 mg by mouth 2 (two) times daily. , Disp: , Rfl:   Review of Systems  Constitutional: Negative.   Eyes: Negative.   Respiratory: Negative.   Cardiovascular: Negative.   Endocrine: Negative.   Neurological: Positive for dizziness and light-headedness.    Social History   Tobacco Use  . Smoking status: Never Smoker  . Smokeless tobacco: Never Used  Substance Use Topics  . Alcohol use: No    Alcohol/week: 0.0 standard drinks      Objective:   BP (!) 132/93 (BP Location: Right Arm, Patient Position: Sitting, Cuff Size: Large)   Pulse 79   Temp 98.2 F (36.8 C) (Oral)   Resp 16  Vitals:   10/09/18  1022  BP: (!) 132/93  Pulse: 79  Resp: 16  Temp: 98.2 F (36.8 C)  TempSrc: Oral     Physical Exam Vitals signs reviewed.  Constitutional:      Appearance: Normal appearance. She is well-developed.  HENT:     Head: Normocephalic and atraumatic.  Neck:     Musculoskeletal: Normal range of motion and neck supple.  Pulmonary:     Effort: Pulmonary effort is normal. No respiratory distress.  Neurological:     Mental Status: She is alert. Mental status is at baseline.  Psychiatric:        Behavior: Behavior normal.        Thought Content: Thought content normal.        Judgment:  Judgment normal.        Assessment & Plan    1. Type 2 diabetes mellitus without complication, without long-term current use of insulin (HCC) A1c stable at 7.1. Continue metformin 500mg  daily. Continue lifestyle modifications. I will see her back in 3 months for recheck.  - POCT glycosylated hemoglobin (Hb A1C)  2. Dizziness Better than previous but still gets a lightheaded sensation if standing too long and gets positional dizziness with changing positions in bed. Discussed home exercises, which she agrees she has a print out from previous visits with the exercises. Also discussed using flonase for ETD. She agrees.Has seen ENT in the past. 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 Call if worsening.     I discussed the assessment and treatment plan with the patient. The patient was provided an opportunity to ask questions and all were answered. The patient agreed with the plan and demonstrated an understanding of the instructions.   The patient was advised to call back or seek an in-person evaluation if the symptoms worsen or if the condition fails to improve as anticipated.  I provided 23 minutes of non-face-to-face time during this encounter.   Margaretann Loveless, PA-C  [facility]

## 2018-10-11 ENCOUNTER — Other Ambulatory Visit: Payer: Self-pay | Admitting: Physician Assistant

## 2018-10-11 ENCOUNTER — Encounter: Payer: Self-pay | Admitting: Physician Assistant

## 2018-10-11 DIAGNOSIS — K219 Gastro-esophageal reflux disease without esophagitis: Secondary | ICD-10-CM

## 2018-10-11 DIAGNOSIS — E119 Type 2 diabetes mellitus without complications: Secondary | ICD-10-CM

## 2018-10-11 DIAGNOSIS — R030 Elevated blood-pressure reading, without diagnosis of hypertension: Secondary | ICD-10-CM

## 2018-10-11 MED ORDER — OMEPRAZOLE 20 MG PO CPDR: 20.0000 mg | DELAYED_RELEASE_CAPSULE | Freq: Two times a day (BID) | ORAL | 1 refills | Status: DC

## 2018-10-11 MED ORDER — HYDROCHLOROTHIAZIDE 12.5 MG PO CAPS: 12.5000 mg | ORAL_CAPSULE | Freq: Every day | ORAL | 1 refills | Status: DC

## 2018-10-11 MED ORDER — METFORMIN HCL 500 MG PO TABS: 500.0000 mg | ORAL_TABLET | Freq: Every day | ORAL | 1 refills | Status: DC

## 2018-11-15 ENCOUNTER — Encounter: Payer: Self-pay | Admitting: Physician Assistant

## 2018-11-17 ENCOUNTER — Telehealth: Payer: BC Managed Care – PPO | Admitting: Family

## 2018-11-17 DIAGNOSIS — H109 Unspecified conjunctivitis: Secondary | ICD-10-CM | POA: Diagnosis not present

## 2018-11-17 MED ORDER — OFLOXACIN 0.3 % OP SOLN: 1.0000 [drp] | Freq: Four times a day (QID) | OPHTHALMIC | 0 refills | Status: DC

## 2018-11-17 NOTE — Progress Notes (Signed)

## 2018-11-18 NOTE — Telephone Encounter (Signed)
Patient can do evisit.

## 2018-11-19 NOTE — Telephone Encounter (Signed)
Patient did e-visit and reports that her eye is much better.

## 2018-11-28 ENCOUNTER — Other Ambulatory Visit: Payer: Self-pay | Admitting: Physician Assistant

## 2018-11-28 DIAGNOSIS — I10 Essential (primary) hypertension: Secondary | ICD-10-CM

## 2018-12-25 ENCOUNTER — Other Ambulatory Visit: Payer: Self-pay | Admitting: Certified Nurse Midwife

## 2018-12-25 DIAGNOSIS — Z1231 Encounter for screening mammogram for malignant neoplasm of breast: Secondary | ICD-10-CM

## 2019-01-01 ENCOUNTER — Encounter: Payer: Self-pay | Admitting: Physician Assistant

## 2019-01-02 ENCOUNTER — Other Ambulatory Visit: Payer: Self-pay | Admitting: Physician Assistant

## 2019-01-02 DIAGNOSIS — R42 Dizziness and giddiness: Secondary | ICD-10-CM

## 2019-01-03 MED ORDER — MECLIZINE HCL 25 MG PO TABS: 25.0000 mg | ORAL_TABLET | Freq: Three times a day (TID) | ORAL | 0 refills | Status: DC | PRN

## 2019-01-12 NOTE — Progress Notes (Signed)
Gynecology Annual Exam  PCP: Margaretann LovelessBurnette, Jennifer M, PA-C  Chief Complaint:  Chief Complaint  Patient presents with  . Gynecologic Exam    No complaints    History of Present Illness: Patient is a 48 y.o. [id number] presents for annual exam. The patient has no complaints today. 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 She has a history of a LSH for menorrhagia in 2012. Has not had any vaginal bleeding. She has occasional hot flash, and normal FSH and LH 11/2017. Her past medical history is notable for hypertension, benign postural vertigo,obesity and T2DM. Since her last annual exam 01/11/2018, she has had an EGD and colonoscopy 05/02/2018 for abdominal pain. Diagnosed with gastritis due to NSAID use. Colonoscopy was WNL. Her last Pap smear was 01/11/2018 and was NIL/ negative HRHPV The patient does perform occasional  monthly self breast exams.  There is notable family history of breast cancer in her sister at age 48. Genetic testing has not been done. There is no history of  ovarian cancer in her family. She does not smoke. She does not drink alcohol. She exercises by doing yardwork.. Current BMI=36.66 kg/m2. She does get adequate calcium in her diet.  Her routine screening labs are managed by her PCP. Her cholesterol screen in 05/2018 was mildly elevated. Last hemoglobin A1C was 7.1%.  Review of Systems: Review of Systems  Constitutional: Negative for chills, fever, malaise/fatigue and weight loss.       Positive for weight gain (7#)  HENT: Negative for congestion, sinus pain and sore throat.   Eyes: Negative for blurred vision and pain.  Respiratory: Negative for hemoptysis, shortness of breath and wheezing.   Cardiovascular: Negative for chest pain, palpitations and leg swelling.  Gastrointestinal: Positive for constipation. Negative for abdominal pain, blood in stool, diarrhea, heartburn, nausea and vomiting.  Genitourinary: Negative  for dysuria, frequency, hematuria and urgency.  Musculoskeletal: Negative for back pain, joint pain and myalgias.  Skin: Negative for itching and rash.  Neurological: Negative for dizziness, tingling and headaches.  Endo/Heme/Allergies: Negative for environmental allergies and polydipsia. Does not bruise/bleed easily.       Negative for hirsutism   Psychiatric/Behavioral: Negative for depression. The patient is not nervous/anxious and does not have insomnia.     Past Medical History:  Past Medical History:  Diagnosis Date  . Anxiety   . Diabetes mellitus, type II (HCC)   . Dysmenorrhea   . Hypertension   . Hypothyroidism   . Menorrhagia   . Morbid obesity (HCC)   . Vertigo     Past Surgical History:  Past Surgical History:  Procedure Laterality Date  . ABDOMINAL HYSTERECTOMY    . CHOLECYSTECTOMY  09/2017   [facility]  . CRYOABLATION  08/24/2010   [facility]  [facility]   [facility]  . MASTOID DEBRIDEMENT     age 89  . OVARIAN CYST REMOVAL  04/13/2011  . TONSILLECTOMY  1977  . TUBAL LIGATION  2001   postpartum  . WISDOM TOOTH EXTRACTION        Family History:  Family History  Problem Relation Age of Onset  . Diabetes Mother   . Hypertension Mother   . Breast cancer Sister [id number]  . Heart disease Maternal Grandmother   . Lung cancer Maternal Grandfather     Social History:  Social History   Socioeconomic History  . Marital status: Legally Separated    Spouse name:  Not on file  . Number of children: 4  . Years of education: Not on file  . Highest education level: Not on file  Occupational History  . 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 Financial resource strain: Not on file  . Food insecurity    Worry: Not on file    Inability: Not on file  . Transportation needs    Medical: Not on file    Non-medical: Not on file  Tobacco Use  . Smoking status: Never Smoker  . Smokeless tobacco: Never Used  Substance and Sexual Activity  . Alcohol use: No     Alcohol/week: 0.0 standard drinks  . Drug use: No  . Sexual activity: Yes    Birth control/protection: Surgical  Lifestyle  . Physical activity    Days per week: 0 days    Minutes per session: 0 min  . Stress: Not at all  Relationships  . Social Herbalist on phone: Not on file    Gets together: Not on file    Attends religious service: Not on file    Active member of club or organization: Not on file    Attends meetings of clubs or organizations: Not on file    Relationship status: Not on file  . Intimate partner violence    Fear of current or ex partner: Not on file    Emotionally abused: Not on file    Physically abused: Not on file    Forced sexual activity: Not on file  Other Topics Concern  . Not on file  Social History Narrative  . Not on file    Allergies:  Allergies  Allergen Reactions  . Sulfamethoxazole-Trimethoprim     rash    Medications: Current Outpatient Medications on File Prior to Visit  Medication Sig Dispense Refill  . amLODipine (NORVASC) 5 MG tablet TAKE 1 TABLET BY MOUTH EVERY DAY 90 tablet 1  . hydrochlorothiazide (MICROZIDE) 12.5 MG capsule Take 1 capsule (12.5 mg total) by mouth daily. 90 capsule 1  . meclizine (ANTIVERT) 25 MG tablet Take 1 tablet (25 mg total) by mouth 3 (three) times daily as needed for dizziness. 30 tablet 0  . metFORMIN (GLUCOPHAGE) 500 MG tablet Take 1 tablet (500 mg total) by mouth daily with breakfast. 90 tablet 1  . Omega-3 Fatty Acids (FISH OIL) 1200 MG CAPS Take 1 capsule by mouth daily.    Orlie Dakin Sodium (SENNA-DOCUSATE SODIUM PO) Take by mouth as needed.     Marland Kitchen omeprazole (PRILOSEC) 20 MG capsule Take 1 capsule (20 mg total) by mouth 2 (two) times daily before a meal. 180 capsule 1   No current facility-administered medications on file prior to visit.           Physical Exam Vitals: BP 108/66 (BP Location: Right Arm, Patient Position: Sitting, Cuff Size: Normal)   Pulse 69   Ht  5\' 6"  (1.676 m)   Wt 227 lb (103 kg)   BMI 36.64 kg/m  General: pleasant WF, in NAD HEENT: normocephalic, anicteric Thyroid: no enlargement, no palpable nodules Pulmonary: No increased work of breathing, CTAB Cardiovascular: RRR without murmur Breast: Breast symmetrical, no tenderness, no palpable nodules or masses, no skin or nipple retraction present, no nipple discharge.  No axillary, infraclavicular, or supraclavicular lymphadenopathy. Abdomen: obese, soft, non-tender, non-distended.  Umbilicus without lesions.  No hepatomegaly or masses palpable. No evidence of hernia  Genitourinary:  External: small papular lesion on left labia majora-appears to be  healing folliculitis. Normal urethral meatus, normal Bartholin's and Skene's glands.    Vagina: Normal vaginal mucosa, rectocele present    Cervix: Grossly normal in appearance, no bleeding  Uterus: surgically absent  Adnexa: no adnexal masses, NT  Rectal: deferred  Lymphatic: no evidence of inguinal lymphadenopathy Extremities: no edema, erythema, or tenderness Neurologic: Grossly intact Psychiatric: mood appropriate, affect full   Assessment: 48 y.o. [id number] well woman exam Hx of LSH Family history of breast cancer in her sister   Plan:   1) Breast cancer screening: Mammogram - recommend yearly screening mammogram.  Mammogram is scheduled for 2 July. Offered genetic testing due to family history of breast cancer and patient declined.   2) Cervical cancer screening: Pap smear done. Desires annual Pap smears  3) Routine healthcare maintenance including cholesterol, diabetes screening  managed by PCP   4) Colon cancer screening: Colonoscopy last year was normal.  5) RTO 1 year for Pap smear/annual.   Farrel Connersolleen Md Smola, CNM

## 2019-01-13 ENCOUNTER — Ambulatory Visit (INDEPENDENT_AMBULATORY_CARE_PROVIDER_SITE_OTHER): Payer: BC Managed Care – PPO | Admitting: Certified Nurse Midwife

## 2019-01-13 ENCOUNTER — Other Ambulatory Visit: Payer: Self-pay

## 2019-01-13 ENCOUNTER — Encounter: Payer: Self-pay | Admitting: Certified Nurse Midwife

## 2019-01-13 VITALS — BP 108/66 | HR 69 | Ht 66.0 in | Wt 227.0 lb

## 2019-01-13 DIAGNOSIS — Z01419 Encounter for gynecological examination (general) (routine) without abnormal findings: Secondary | ICD-10-CM

## 2019-01-13 DIAGNOSIS — Z1239 Encounter for other screening for malignant neoplasm of breast: Secondary | ICD-10-CM

## 2019-01-13 DIAGNOSIS — Z124 Encounter for screening for malignant neoplasm of cervix: Secondary | ICD-10-CM

## 2019-01-13 NOTE — Progress Notes (Signed)
Patient: Tammie Bush Female    DOB: 1971/06/20   48 y.o.   MRN: [id number] Visit Date: 01/14/2019  Today's Provider: Margaretann LovelessJennifer M Burnette, PA-C   Chief Complaint  Patient presents with  . Diabetes   Subjective:     HPI  Diabetes Mellitus Type II, Follow-up:   Lab Results  Component Value Date   HGBA1C 7.0 (A) 10/09/2018   HGBA1C 7.1 (H) 06/12/2018   HGBA1C 6.3 (H) 05/07/2017    Last seen for diabetes 3 months ago.  Management since then includes patient stopped taking Metformin approximately 2 weeks ago. She reports poor compliance with treatment. She is not having side effects. Current symptoms include fatigue  Home blood sugar records: fasting range: not being checked at home   Current insulin regiment: none Most Recent Eye Exam: not UTD Weight trend: stable lCurrent exercise: some Current diet habits: low salt  Pertinent Labs:    Component Value Date/Time   CHOL 230 (H) 06/12/2018 1219   TRIG 177 (H) 06/12/2018 1219   HDL 45 06/12/2018 1219   LDLCALC 150 (H) 06/12/2018 1219   LDLCALC 126 (H) 05/07/2017 1652   CREATININE 0.76 06/12/2018 1219   CREATININE 0.77 05/07/2017 1652    Wt Readings from Last 3 Encounters:  01/14/19 225 lb 6.4 oz (102.2 kg)  01/13/19 227 lb (103 kg)  06/12/18 232 lb (105.2 kg)   ------------------------------------------------------------------------  Allergies  Allergen Reactions  . Sulfamethoxazole-Trimethoprim     rash     Current Outpatient Medications:  .  amLODipine (NORVASC) 5 MG tablet, TAKE 1 TABLET BY MOUTH EVERY DAY, Disp: 90 tablet, Rfl: 1 .  hydrochlorothiazide (MICROZIDE) 12.5 MG capsule, Take 1 capsule (12.5 mg total) by mouth daily., Disp: 90 capsule, Rfl: 1 .  meclizine (ANTIVERT) 25 MG tablet, Take 1 tablet (25 mg total) by mouth 3 (three) times daily as needed for dizziness., Disp: 30 tablet, Rfl: 0 .  Omega-3 Fatty Acids (FISH OIL) 1200 MG CAPS, Take 1 capsule by mouth daily., Disp: , Rfl:  .   Sennosides-Docusate Sodium (SENNA-DOCUSATE SODIUM PO), Take by mouth as needed. , Disp: , Rfl:  .  metFORMIN (GLUCOPHAGE) 500 MG tablet, Take 1 tablet (500 mg total) by mouth daily with breakfast. (Patient not taking: Reported on 01/14/2019), Disp: 90 tablet, Rfl: 1 .  omeprazole (PRILOSEC) 20 MG capsule, Take 1 capsule (20 mg total) by mouth 2 (two) times daily before a meal., Disp: 180 capsule, Rfl: 1  Review of Systems  Constitutional: Positive for fatigue.  Respiratory: Negative.   Cardiovascular: Negative.   Endocrine: Negative.   Musculoskeletal: Negative.   Neurological: Negative.     Social History   Tobacco Use  . Smoking status: Never Smoker  . Smokeless tobacco: Never Used  Substance Use Topics  . Alcohol use: No    Alcohol/week: 0.0 standard drinks      Objective:   BP (!) 141/86 (BP Location: Left Arm, Patient Position: Sitting, Cuff Size: Large)   Pulse 79   Temp 98.1 F (36.7 C) (Oral)   Wt 225 lb 6.4 oz (102.2 kg)   BMI 36.38 kg/m  Vitals:   01/14/19 1334  BP: (!) 141/86  Pulse: 79  Temp: 98.1 F (36.7 C)  TempSrc: Oral  Weight: 225 lb 6.4 oz (102.2 kg)     Physical Exam Vitals signs reviewed.  Constitutional:      General: She is not in acute distress.    Appearance: Normal appearance.  She is well-developed. She is obese. She is not ill-appearing or diaphoretic.  HENT:     Head: Normocephalic and atraumatic.     Right Ear: Tympanic membrane, ear canal and external ear normal.     Left Ear: Tympanic membrane, ear canal and external ear normal.     Nose: Nose normal.     Mouth/Throat:     Mouth: Mucous membranes are moist.     Pharynx: No oropharyngeal exudate or posterior oropharyngeal erythema.  Eyes:     General: No scleral icterus.       Right eye: No discharge.        Left eye: No discharge.     Conjunctiva/sclera: Conjunctivae normal.     Pupils: Pupils are equal, round, and reactive to light.  Neck:     Musculoskeletal: Normal range  of motion and neck supple.     Thyroid: No thyromegaly.     Vascular: No JVD.     Trachea: No tracheal deviation.  Cardiovascular:     Rate and Rhythm: Normal rate and regular rhythm.     Pulses: Normal pulses.     Heart sounds: Normal heart sounds. No murmur. No friction rub. No gallop.   Pulmonary:     Effort: Pulmonary effort is normal. No respiratory distress.     Breath sounds: Normal breath sounds. No wheezing or rales.  Chest:     Chest wall: No tenderness.  Abdominal:     General: Bowel sounds are normal. There is no distension.     Palpations: Abdomen is soft. There is no mass.     Tenderness: There is no abdominal tenderness. There is no guarding or rebound.  Musculoskeletal: Normal range of motion.        General: No tenderness.  Lymphadenopathy:     Cervical: No cervical adenopathy.  Skin:    General: Skin is warm and dry.     Capillary Refill: Capillary refill takes less than 2 seconds.     Findings: No rash.  Neurological:     General: No focal deficit present.     Mental Status: She is alert and oriented to person, place, and time. Mental status is at baseline.     Cranial Nerves: No cranial nerve deficit.     Motor: No weakness.     Gait: Gait normal.  Psychiatric:        Mood and Affect: Mood normal.        Behavior: Behavior normal.        Thought Content: Thought content normal.        Judgment: Judgment normal.    Diabetic Foot Exam - Simple   Simple Foot Form Diabetic Foot exam was performed with the following findings: Yes 01/14/2019  4:10 PM  Visual Inspection No deformities, no ulcerations, no other skin breakdown bilaterally: Yes Sensation Testing Intact to touch and monofilament testing bilaterally: Yes Pulse Check Posterior Tibialis and Dorsalis pulse intact bilaterally: Yes Comments      No results found for any visits on 01/14/19.     Assessment & Plan    1. Type 2 diabetes mellitus without complication, without long-term current  use of insulin ([place]) Patient stopped taking metformin two weeks ago and feels better being off metformin. She reports she was having worsening fatigue and diarrhea being on metformin. Will check labs as below and f/u pending results. If A1c requires treatment may consider an SGLT-2 inhibitor.  - CBC w/Diff/Platelet - Comprehensive Metabolic Panel (CMET) -  Lipid Profile - HgB A1c  2. Intertrigo Occurs under breast due to humidity and sweating.  Diagnosis pulled for medication refill. Continue current medical treatment plan. - nystatin-triamcinolone ointment (MYCOLOG); Apply 1 application topically 2 (two) times daily.  Dispense: 30 g; Refill: 0  3. Fatigue, unspecified type Improving but still has some fatigue. Will check labs as below and f/u pending results. - CBC w/Diff/Platelet - Comprehensive Metabolic Panel (CMET) - TSH - Lipid Profile - HgB A1c - Vitamin D (25 hydroxy) - B12  4. Essential hypertension Elevated today but was lower at her GYN appt. Will monitor. Continue Amlodipine 5mg  and HCTZ 12.5mg . Will check labs as below and f/u pending results. - CBC w/Diff/Platelet - Comprehensive Metabolic Panel (CMET) - Lipid Profile - HgB A1c  5. Hypothyroidism due to acquired atrophy of thyroid Stable. Not on treatment. Will check labs as below and f/u pending results. - CBC w/Diff/Platelet - Comprehensive Metabolic Panel (CMET) - TSH  6. Hypercholesterolemia Diet controlled. Will check labs as below and f/u pending results. - CBC w/Diff/Platelet - Comprehensive Metabolic Panel (CMET) - Lipid Profile - HgB A1c  7. Class 2 severe obesity due to excess calories with serious comorbidity and body mass index (BMI) of 36.0 to 36.9 in adult [facility](HCC) Counseled patient on healthy lifestyle modifications including dieting and exercise. Patient has lost 5 pounds on her own.  - CBC w/Diff/Platelet - Comprehensive Metabolic Panel (CMET) - Lipid Profile - HgB A1c  8. Gastroesophageal  reflux disease without esophagitis Stable on Omeprazole daily. Due to patient having fatigue and chronic PPI use will check Vit D and B12 as below.  - Vitamin D (25 hydroxy) - B12  9. Screening for HIV without presence of risk factors Will check labs as below and f/u pending results. - HIV antibody (with reflex)     Margaretann LovelessJennifer M Burnette, PA-C  [facility]

## 2019-01-14 ENCOUNTER — Encounter: Payer: Self-pay | Admitting: Physician Assistant

## 2019-01-14 ENCOUNTER — Ambulatory Visit: Payer: BC Managed Care – PPO | Admitting: Physician Assistant

## 2019-01-14 VITALS — BP 141/86 | HR 79 | Temp 98.1°F | Wt 225.4 lb

## 2019-01-14 DIAGNOSIS — Z6836 Body mass index (BMI) 36.0-36.9, adult: Secondary | ICD-10-CM

## 2019-01-14 DIAGNOSIS — I10 Essential (primary) hypertension: Secondary | ICD-10-CM

## 2019-01-14 DIAGNOSIS — R5383 Other fatigue: Secondary | ICD-10-CM | POA: Diagnosis not present

## 2019-01-14 DIAGNOSIS — E034 Atrophy of thyroid (acquired): Secondary | ICD-10-CM

## 2019-01-14 DIAGNOSIS — L304 Erythema intertrigo: Secondary | ICD-10-CM | POA: Diagnosis not present

## 2019-01-14 DIAGNOSIS — K219 Gastro-esophageal reflux disease without esophagitis: Secondary | ICD-10-CM

## 2019-01-14 DIAGNOSIS — Z114 Encounter for screening for human immunodeficiency virus [HIV]: Secondary | ICD-10-CM

## 2019-01-14 DIAGNOSIS — E119 Type 2 diabetes mellitus without complications: Secondary | ICD-10-CM | POA: Diagnosis not present

## 2019-01-14 DIAGNOSIS — E7800 Pure hypercholesterolemia, unspecified: Secondary | ICD-10-CM

## 2019-01-14 MED ORDER — NYSTATIN-TRIAMCINOLONE 100000-0.1 UNIT/GM-% EX OINT: 1.0000 "application " | TOPICAL_OINTMENT | Freq: Two times a day (BID) | CUTANEOUS | 0 refills | Status: DC

## 2019-01-14 NOTE — Patient Instructions (Signed)
Health Maintenance, Female Adopting a healthy lifestyle and getting preventive care are important in promoting health and wellness. Ask your health care provider about:  The right schedule for you to have regular tests and exams.  Things you can do on your own to prevent diseases and keep yourself healthy. What should I know about diet, weight, and exercise? Eat a healthy diet   Eat a diet that includes plenty of vegetables, fruits, low-fat dairy products, and lean protein.  Do not eat a lot of foods that are high in solid fats, added sugars, or sodium. Maintain a healthy weight Body mass index (BMI) is used to identify weight problems. It estimates body fat based on height and weight. Your health care provider can help determine your BMI and help you achieve or maintain a healthy weight. Get regular exercise Get regular exercise. This is one of the most important things you can do for your health. Most adults should:  Exercise for at least 150 minutes each week. The exercise should increase your heart rate and make you sweat (moderate-intensity exercise).  Do strengthening exercises at least twice a week. This is in addition to the moderate-intensity exercise.  Spend less time sitting. Even light physical activity can be beneficial. Watch cholesterol and blood lipids Have your blood tested for lipids and cholesterol at 48 years of age, then have this test every 5 years. Have your cholesterol levels checked more often if:  Your lipid or cholesterol levels are high.  You are older than 48 years of age.  You are at high risk for heart disease. What should I know about cancer screening? Depending on your health history and family history, you may need to have cancer screening at various ages. This may include screening for:  Breast cancer.  Cervical cancer.  Colorectal cancer.  Skin cancer.  Lung cancer. What should I know about heart disease, diabetes, and high blood  pressure? Blood pressure and heart disease  High blood pressure causes heart disease and increases the risk of stroke. This is more likely to develop in people who have high blood pressure readings, are of African descent, or are overweight.  Have your blood pressure checked: ? Every 3-5 years if you are 18-39 years of age. ? Every year if you are 40 years old or older. Diabetes Have regular diabetes screenings. This checks your fasting blood sugar level. Have the screening done:  Once every three years after age 40 if you are at a normal weight and have a low risk for diabetes.  More often and at a younger age if you are overweight or have a high risk for diabetes. What should I know about preventing infection? Hepatitis B If you have a higher risk for hepatitis B, you should be screened for this virus. Talk with your health care provider to find out if you are at risk for hepatitis B infection. Hepatitis C Testing is recommended for:  Everyone born from 1945 through 1965.  Anyone with known risk factors for hepatitis C. Sexually transmitted infections (STIs)  Get screened for STIs, including gonorrhea and chlamydia, if: ? You are sexually active and are younger than 48 years of age. ? You are older than 48 years of age and your health care provider tells you that you are at risk for this type of infection. ? Your sexual activity has changed since you were last screened, and you are at increased risk for chlamydia or gonorrhea. Ask your health care provider if   you are at risk.  Ask your health care provider about whether you are at high risk for HIV. Your health care provider may recommend a prescription medicine to help prevent HIV infection. If you choose to take medicine to prevent HIV, you should first get tested for HIV. You should then be tested every 3 months for as long as you are taking the medicine. Pregnancy  If you are about to stop having your period (premenopausal) and  you may become pregnant, seek counseling before you get pregnant.  Take 400 to 800 micrograms (mcg) of folic acid every day if you become pregnant.  Ask for birth control (contraception) if you want to prevent pregnancy. Osteoporosis and menopause Osteoporosis is a disease in which the bones lose minerals and strength with aging. This can result in bone fractures. If you are 65 years old or older, or if you are at risk for osteoporosis and fractures, ask your health care provider if you should:  Be screened for bone loss.  Take a calcium or vitamin D supplement to lower your risk of fractures.  Be given hormone replacement therapy (HRT) to treat symptoms of menopause. Follow these instructions at home: Lifestyle  Do not use any products that contain nicotine or tobacco, such as cigarettes, e-cigarettes, and chewing tobacco. If you need help quitting, ask your health care provider.  Do not use street drugs.  Do not share needles.  Ask your health care provider for help if you need support or information about quitting drugs. Alcohol use  Do not drink alcohol if: ? Your health care provider tells you not to drink. ? You are pregnant, may be pregnant, or are planning to become pregnant.  If you drink alcohol: ? Limit how much you use to 0-1 drink a day. ? Limit intake if you are breastfeeding.  Be aware of how much alcohol is in your drink. In the [place] one drink equals one 12 oz bottle of beer (355 mL), one 5 oz glass of wine (148 mL), or one 1 oz glass of hard liquor (44 mL). General instructions  Schedule regular health, dental, and eye exams.  Stay current with your vaccines.  Tell your health care provider if: ? You often feel depressed. ? You have ever been abused or do not feel safe at home. Summary  Adopting a healthy lifestyle and getting preventive care are important in promoting health and wellness.  Follow your health care provider's instructions about healthy  diet, exercising, and getting tested or screened for diseases.  Follow your health care provider's instructions on monitoring your cholesterol and blood pressure. This information is not intended to replace advice given to you by your health care provider. Make sure you discuss any questions you have with your health care provider. Document Released: 01/16/2011 Document Revised: 06/26/2018 Document Reviewed: 06/26/2018 Elsevier Patient Education  2020 Elsevier Inc.  

## 2019-01-15 ENCOUNTER — Telehealth: Payer: Self-pay

## 2019-01-15 ENCOUNTER — Encounter: Payer: Self-pay | Admitting: Physician Assistant

## 2019-01-15 DIAGNOSIS — E119 Type 2 diabetes mellitus without complications: Secondary | ICD-10-CM

## 2019-01-15 MED ORDER — GLIPIZIDE ER 10 MG PO TB24: 10.0000 mg | ORAL_TABLET | Freq: Every day | ORAL | 1 refills | Status: DC

## 2019-01-15 MED ORDER — DAPAGLIFLOZIN PROPANEDIOL 5 MG PO TABS: 5.0000 mg | ORAL_TABLET | Freq: Every day | ORAL | 1 refills | Status: DC

## 2019-01-15 NOTE — Telephone Encounter (Signed)
Patient saw results message through my chart and sent the provider a message through my chart

## 2019-01-15 NOTE — Telephone Encounter (Signed)
-----   Message from Mar Daring, PA-C sent at 01/15/2019  8:56 AM EDT ----- Blood count is stable. Kidney and liver function are normal. Sodium, potassium and calcium are normal. Thyroid is normal. Cholesterol is still elevated but stable when compared to last year. A1c is up to 7.3 from 7.0. I will send in Jardiance to see if we can get that started for sugar control. May benefit also from a cholesterol lowering medication to reduce risk since she is diabetic also. If agreeable I will send in. Vit D is low at 22.2. Recommend OTC Vit D 1000-2000 IU daily. B12 is normal, but low normal. May benefit from OTC B12 supplement as well (1000 mcg).

## 2019-01-15 NOTE — Addendum Note (Signed)
Addended by: Mar Daring on: 01/15/2019 08:57 AM   Modules accepted: Orders

## 2019-01-16 ENCOUNTER — Other Ambulatory Visit: Payer: Self-pay

## 2019-01-16 ENCOUNTER — Ambulatory Visit
Admission: RE | Admit: 2019-01-16 | Discharge: 2019-01-16 | Disposition: A | Discharge status: Home / Self Care | Payer: BC Managed Care – PPO | Source: Ambulatory Visit | Attending: Certified Nurse Midwife | Admitting: Certified Nurse Midwife

## 2019-01-16 DIAGNOSIS — Z1231 Encounter for screening mammogram for malignant neoplasm of breast: Secondary | ICD-10-CM | POA: Diagnosis not present

## 2019-01-16 LAB — HEMOGLOBIN A1C
Est. average glucose Bld gHb Est-mCnc: 163 mg/dL
Hgb A1c MFr Bld: 7.3 % — ABNORMAL HIGH (ref 4.8–5.6)

## 2019-01-16 LAB — COMPREHENSIVE METABOLIC PANEL
ALT: 10 IU/L (ref 0–32)
AST: 13 IU/L (ref 0–40)
Albumin/Globulin Ratio: 1.5 (ref 1.2–2.2)
Albumin: 4 g/dL (ref 3.8–4.8)
Alkaline Phosphatase: 89 IU/L (ref 39–117)
BUN/Creatinine Ratio: 14 (ref 9–23)
BUN: 10 mg/dL (ref 6–24)
Bilirubin Total: 0.3 mg/dL (ref 0.0–1.2)
CO2: 22 mmol/L (ref 20–29)
Calcium: 9.4 mg/dL (ref 8.7–10.2)
Chloride: 102 mmol/L (ref 96–106)
Creatinine, Ser: 0.72 mg/dL (ref 0.57–1.00)
GFR calc Af Amer: 115 mL/min/{1.73_m2} (ref >59)
GFR calc non Af Amer: 99 mL/min/{1.73_m2} (ref >59)
Globulin, Total: 2.6 g/dL (ref 1.5–4.5)
Glucose: 131 mg/dL — ABNORMAL HIGH (ref 65–99)
Potassium: 3.8 mmol/L (ref 3.5–5.2)
Sodium: 140 mmol/L (ref 134–144)
Total Protein: 6.6 g/dL (ref 6.0–8.5)

## 2019-01-16 LAB — CBC WITH DIFFERENTIAL/PLATELET
Basophils Absolute: 0.1 10*3/uL (ref 0.0–0.2)
Basos: 1 %
EOS (ABSOLUTE): 0.8 10*3/uL — ABNORMAL HIGH (ref 0.0–0.4)
Eos: 7 %
Hematocrit: 41.1 % (ref 34.0–46.6)
Hemoglobin: 14.1 g/dL (ref 11.1–15.9)
Immature Grans (Abs): 0.1 10*3/uL (ref 0.0–0.1)
Immature Granulocytes: 1 %
Lymphocytes Absolute: 3.7 10*3/uL — ABNORMAL HIGH (ref 0.7–3.1)
Lymphs: 34 %
MCH: 28.4 pg (ref 26.6–33.0)
MCHC: 34.3 g/dL (ref 31.5–35.7)
MCV: 83 fL (ref 79–97)
Monocytes Absolute: 0.5 10*3/uL (ref 0.1–0.9)
Monocytes: 5 %
Neutrophils Absolute: 5.8 10*3/uL (ref 1.4–7.0)
Neutrophils: 52 %
Platelets: 338 10*3/uL (ref 150–450)
RBC: 4.97 x10E6/uL (ref 3.77–5.28)
RDW: 13.6 % (ref 11.7–15.4)
WBC: 10.9 10*3/uL — ABNORMAL HIGH (ref 3.4–10.8)

## 2019-01-16 LAB — VITAMIN D 25 HYDROXY (VIT D DEFICIENCY, FRACTURES): Vit D, 25-Hydroxy: 22.2 ng/mL — ABNORMAL LOW (ref 30.0–100.0)

## 2019-01-16 LAB — LIPID PANEL
Chol/HDL Ratio: 5.6 ratio — ABNORMAL HIGH (ref 0.0–4.4)
Cholesterol, Total: 224 mg/dL — ABNORMAL HIGH (ref 100–199)
HDL: 40 mg/dL (ref >39)
LDL Calculated: 152 mg/dL — ABNORMAL HIGH (ref 0–99)
Triglycerides: 160 mg/dL — ABNORMAL HIGH (ref 0–149)
VLDL Cholesterol Cal: 32 mg/dL (ref 5–40)

## 2019-01-16 LAB — HIV ANTIBODY (ROUTINE TESTING W REFLEX): HIV Screen 4th Generation wRfx: NONREACTIVE

## 2019-01-16 LAB — VITAMIN B12: Vitamin B-12: 292 pg/mL (ref 232–1245)

## 2019-01-16 LAB — TSH: TSH: 3.86 u[IU]/mL (ref 0.450–4.500)

## 2019-01-17 LAB — IGP,RFX APTIMA HPV ALL PTH

## 2019-01-21 NOTE — Telephone Encounter (Signed)
Pt wants itemized copies of paps and mammograms mailed to her. Wants to be called if they can be mailed. [phone]

## 2019-02-04 ENCOUNTER — Encounter: Payer: Self-pay | Admitting: Physician Assistant

## 2019-02-04 DIAGNOSIS — L0292 Furuncle, unspecified: Secondary | ICD-10-CM

## 2019-02-05 ENCOUNTER — Telehealth: Payer: BC Managed Care – PPO | Admitting: Nurse Practitioner

## 2019-02-05 DIAGNOSIS — L732 Hidradenitis suppurativa: Secondary | ICD-10-CM

## 2019-02-05 MED ORDER — DOXYCYCLINE HYCLATE 100 MG PO TABS: 100.0000 mg | ORAL_TABLET | Freq: Two times a day (BID) | ORAL | 0 refills | Status: DC

## 2019-02-05 MED ORDER — CLINDAMYCIN HCL 300 MG PO CAPS: 300.0000 mg | ORAL_CAPSULE | Freq: Three times a day (TID) | ORAL | 0 refills | Status: DC

## 2019-02-05 NOTE — Progress Notes (Signed)
E Visit for Cellulitis  We are sorry that you are not feeling well. Here is how we plan to help!  Based on what you shared with me it looks like you have cellulitis.  Cellulitis looks like areas of skin redness, swelling, and warmth; it develops as a result of bacteria entering under the skin. Your particular problem is called hydradenitis, which is an infection of sweat glands. Little red spots and/or bleeding can be seen in skin, and tiny surface sacs containing fluid can occur. Fever can be present. Cellulitis is almost always on one side of a body, and the lower limbs are the most common site of involvement.   I have prescribed:  Clindamycin, 300mg  orally three times a day for 5 days  HOME CARE:  . Take your medications as ordered and take all of them, even if the skin irritation appears to be healing.   GET HELP RIGHT AWAY IF:  . Symptoms that don't begin to go away within 48 hours. . Severe redness persists or worsens . If the area turns color, spreads or swells. . If it blisters and opens, develops yellow-brown crust or bleeds. . You develop a fever or chills. . If the pain increases or becomes unbearable.  . Are unable to keep fluids and food down.  MAKE SURE YOU    Understand these instructions.  Will watch your condition.  Will get help right away if you are not doing well or get worse.  Thank you for choosing an e-visit. Your e-visit answers were reviewed by a board certified advanced clinical practitioner to complete your personal care plan. Depending upon the condition, your plan could have included both over the counter or prescription medications. Please review your pharmacy choice. Make sure the pharmacy is open so you can pick up prescription now. If there is a problem, you may contact your provider through [employer] and have the prescription routed to another pharmacy. Your safety is important to [place]. If you have drug allergies check your prescription  carefully.  For the next 24 hours you can use MyChart to ask questions about today's visit, request a non-urgent call back, or ask for a work or school excuse. You will get an email in the next two days asking about your experience. I hope that your e-visit has been valuable and will speed your recovery.  5-10 minutes spent reviewing and documenting in chart.

## 2019-02-05 NOTE — Telephone Encounter (Signed)
Doxycycline sent in.

## 2019-03-04 LAB — HM DIABETES EYE EXAM

## 2019-03-10 ENCOUNTER — Encounter: Payer: Self-pay | Admitting: Physician Assistant

## 2019-04-01 ENCOUNTER — Telehealth: Payer: BC Managed Care – PPO | Admitting: Nurse Practitioner

## 2019-04-01 DIAGNOSIS — W57XXXA Bitten or stung by nonvenomous insect and other nonvenomous arthropods, initial encounter: Secondary | ICD-10-CM

## 2019-04-01 DIAGNOSIS — S40861A Insect bite (nonvenomous) of right upper arm, initial encounter: Secondary | ICD-10-CM

## 2019-04-01 MED ORDER — CEPHALEXIN 500 MG PO CAPS: 500.0000 mg | ORAL_CAPSULE | Freq: Two times a day (BID) | ORAL | 0 refills | Status: DC

## 2019-04-01 NOTE — Progress Notes (Signed)
E Visit for Insect Sting  Thank you for describing the insect bites that have become infected .  Here is how we plan to help! I have prescribed you keflex 500mg  1 po BID for 7 days   What can be used to prevent Insect Stings?   Insect repellant with at least 20% DEET.    Wearing long pants and shirts with socks and shoes.    Wear dark or drab-colored clothes rather than bright colors.    Avoid using perfumes and hair sprays; these attract insects.  HOME CARE ADVICE:  1. Keep clean anddry 2. Use cold compresses to the area of the sting for 10-20 minutes.  You may repeat this as needed to relieve symptoms of pain and swelling. 3.  For pain relief, take acetominophen 650 mg 4-6 hours as needed or ibuprofen 400 mg every 6-8 hours as needed or naproxen 250-500 mg every 12 hours as needed. 4.  You can also use hydrocortisone cream 0.5% or 1% up to 4 times daily as needed for itching. 5.  If the sting becomes very itchy, take Benadryl 25-50 mg, follow directions on box. 6.  Wash the area 2-3 times daily with antibacterial soap and warm water. 7. Call your Doctor if:  Fever, a severe headache, or rash occur in the next 2 weeks.  Sting area begins to look infected.  Redness and swelling worsens after home treatment.  Your current symptoms become worse.    MAKE SURE YOU:   Understand these instructions.  Will watch your condition.  Will get help right away if you are not doing well or get worse.  Thank you for choosing an e-visit. Your e-visit answers were reviewed by a board certified advanced clinical practitioner to complete your personal care plan. Depending upon the condition, your plan could have included both over the counter or prescription medications. Please review your pharmacy choice. Be sure that the pharmacy you have chosen is open so that you can pick up your prescription now.  If there is a problem you may message your provider in [place] to have the  prescription routed to another pharmacy. Your safety is important to [place]. If you have drug allergies check your prescription carefully.  For the next 24 hours, you can use MyChart to ask questions about today's visit, request a non-urgent call back, or ask for a work or school excuse from your e-visit provider. You will get an email in the next two days asking about your experience. I hope that your e-visit has been valuable and will speed your recovery.   5-10 minutes spent reviewing and documenting in chart.

## 2019-04-12 ENCOUNTER — Other Ambulatory Visit: Payer: Self-pay | Admitting: Physician Assistant

## 2019-04-12 DIAGNOSIS — K219 Gastro-esophageal reflux disease without esophagitis: Secondary | ICD-10-CM

## 2019-04-12 DIAGNOSIS — R030 Elevated blood-pressure reading, without diagnosis of hypertension: Secondary | ICD-10-CM

## 2019-04-30 ENCOUNTER — Telehealth: Payer: BC Managed Care – PPO | Admitting: Family

## 2019-04-30 DIAGNOSIS — L089 Local infection of the skin and subcutaneous tissue, unspecified: Secondary | ICD-10-CM | POA: Diagnosis not present

## 2019-04-30 MED ORDER — DOXYCYCLINE HYCLATE 100 MG PO TABS: 100.0000 mg | ORAL_TABLET | Freq: Two times a day (BID) | ORAL | 0 refills | Status: DC

## 2019-04-30 MED ORDER — MUPIROCIN 2 % EX OINT: 1.0000 "application " | TOPICAL_OINTMENT | Freq: Two times a day (BID) | CUTANEOUS | 0 refills | Status: DC

## 2019-04-30 NOTE — Progress Notes (Signed)
E Visit for Cellulitis  We are sorry that you are not feeling well. Here is how we plan to help!  Based on what you shared with me it looks like you have cellulitis.  Cellulitis looks like areas of skin redness, swelling, and warmth; it develops as a result of bacteria entering under the skin. Little red spots and/or bleeding can be seen in skin, and tiny surface sacs containing fluid can occur. Fever can be present. Cellulitis is almost always on one side of a body, and the lower limbs are the most common site of involvement.   I have prescribed:  Doxycycline 100 mg twice a day for 10 days and Bactroban ointment that you will apply twice a day. Make sure you wash your hands well. If this does not improve you need to be seen for the wound to be cultured. It looks like it could be MRSA.   HOME CARE:  . Take your medications as ordered and take all of them, even if the skin irritation appears to be healing.   GET HELP RIGHT AWAY IF:  . Symptoms that don't begin to go away within 48 hours. . Severe redness persists or worsens . If the area turns color, spreads or swells. . If it blisters and opens, develops yellow-brown crust or bleeds. . You develop a fever or chills. . If the pain increases or becomes unbearable.  . Are unable to keep fluids and food down.  MAKE SURE YOU    Understand these instructions.  Will watch your condition.  Will get help right away if you are not doing well or get worse.  Thank you for choosing an e-visit. Your e-visit answers were reviewed by a board certified advanced clinical practitioner to complete your personal care plan. Depending upon the condition, your plan could have included both over the counter or prescription medications. Please review your pharmacy choice. Make sure the pharmacy is open so you can pick up prescription now. If there is a problem, you may contact your provider through [employer] and have the prescription routed to another  pharmacy. Your safety is important to [place]. If you have drug allergies check your prescription carefully.  For the next 24 hours you can use MyChart to ask questions about today's visit, request a non-urgent call back, or ask for a work or school excuse. You will get an email in the next two days asking about your experience. I hope that your e-visit has been valuable and will speed your recovery.   Approximately 5 minutes was spent documenting and reviewing patient's chart.

## 2019-05-13 ENCOUNTER — Encounter: Payer: Self-pay | Admitting: Physician Assistant

## 2019-05-13 DIAGNOSIS — I10 Essential (primary) hypertension: Secondary | ICD-10-CM

## 2019-05-13 DIAGNOSIS — L304 Erythema intertrigo: Secondary | ICD-10-CM

## 2019-05-13 DIAGNOSIS — E7800 Pure hypercholesterolemia, unspecified: Secondary | ICD-10-CM

## 2019-05-13 DIAGNOSIS — E1165 Type 2 diabetes mellitus with hyperglycemia: Secondary | ICD-10-CM

## 2019-05-13 MED ORDER — KETOCONAZOLE 2 % EX CREA: 1.0000 "application " | TOPICAL_CREAM | Freq: Every day | CUTANEOUS | 0 refills | Status: DC

## 2019-05-21 LAB — COMPREHENSIVE METABOLIC PANEL
ALT: 16 IU/L (ref 0–32)
AST: 18 IU/L (ref 0–40)
Albumin/Globulin Ratio: 1.5 (ref 1.2–2.2)
Albumin: 4.2 g/dL (ref 3.8–4.8)
Alkaline Phosphatase: 87 IU/L (ref 39–117)
BUN/Creatinine Ratio: 13 (ref 9–23)
BUN: 10 mg/dL (ref 6–24)
Bilirubin Total: 0.3 mg/dL (ref 0.0–1.2)
CO2: 27 mmol/L (ref 20–29)
Calcium: 9.4 mg/dL (ref 8.7–10.2)
Chloride: 99 mmol/L (ref 96–106)
Creatinine, Ser: 0.78 mg/dL (ref 0.57–1.00)
GFR calc Af Amer: 104 mL/min/{1.73_m2} (ref >59)
GFR calc non Af Amer: 90 mL/min/{1.73_m2} (ref >59)
Globulin, Total: 2.8 g/dL (ref 1.5–4.5)
Glucose: 99 mg/dL (ref 65–99)
Potassium: 3.8 mmol/L (ref 3.5–5.2)
Sodium: 141 mmol/L (ref 134–144)
Total Protein: 7 g/dL (ref 6.0–8.5)

## 2019-05-21 LAB — CBC WITH DIFFERENTIAL/PLATELET
Basophils Absolute: 0.1 10*3/uL (ref 0.0–0.2)
Basos: 1 %
EOS (ABSOLUTE): 0.2 10*3/uL (ref 0.0–0.4)
Eos: 2 %
Hematocrit: 44.4 % (ref 34.0–46.6)
Hemoglobin: 14.3 g/dL (ref 11.1–15.9)
Immature Grans (Abs): 0 10*3/uL (ref 0.0–0.1)
Immature Granulocytes: 0 %
Lymphocytes Absolute: 3.2 10*3/uL — ABNORMAL HIGH (ref 0.7–3.1)
Lymphs: 33 %
MCH: 27.2 pg (ref 26.6–33.0)
MCHC: 32.2 g/dL (ref 31.5–35.7)
MCV: 84 fL (ref 79–97)
Monocytes Absolute: 0.5 10*3/uL (ref 0.1–0.9)
Monocytes: 5 %
Neutrophils Absolute: 5.7 10*3/uL (ref 1.4–7.0)
Neutrophils: 59 %
Platelets: 393 10*3/uL (ref 150–450)
RBC: 5.26 x10E6/uL (ref 3.77–5.28)
RDW: 13.6 % (ref 11.7–15.4)
WBC: 9.7 10*3/uL (ref 3.4–10.8)

## 2019-05-21 LAB — LIPID PANEL
Chol/HDL Ratio: 5 ratio — ABNORMAL HIGH (ref 0.0–4.4)
Cholesterol, Total: 217 mg/dL — ABNORMAL HIGH (ref 100–199)
HDL: 43 mg/dL (ref >39)
LDL Chol Calc (NIH): 148 mg/dL — ABNORMAL HIGH (ref 0–99)
Triglycerides: 141 mg/dL (ref 0–149)
VLDL Cholesterol Cal: 26 mg/dL (ref 5–40)

## 2019-05-21 LAB — HEMOGLOBIN A1C
Est. average glucose Bld gHb Est-mCnc: 131 mg/dL
Hgb A1c MFr Bld: 6.2 % — ABNORMAL HIGH (ref 4.8–5.6)

## 2019-05-22 ENCOUNTER — Encounter: Payer: Self-pay | Admitting: Physician Assistant

## 2019-05-23 ENCOUNTER — Telehealth (INDEPENDENT_AMBULATORY_CARE_PROVIDER_SITE_OTHER): Payer: BC Managed Care – PPO | Admitting: Physician Assistant

## 2019-05-23 ENCOUNTER — Encounter: Payer: Self-pay | Admitting: Physician Assistant

## 2019-05-23 DIAGNOSIS — E1165 Type 2 diabetes mellitus with hyperglycemia: Secondary | ICD-10-CM | POA: Diagnosis not present

## 2019-05-23 DIAGNOSIS — E7800 Pure hypercholesterolemia, unspecified: Secondary | ICD-10-CM | POA: Diagnosis not present

## 2019-05-23 DIAGNOSIS — I10 Essential (primary) hypertension: Secondary | ICD-10-CM

## 2019-05-23 MED ORDER — ATORVASTATIN CALCIUM 10 MG PO TABS: 10.0000 mg | ORAL_TABLET | Freq: Every day | ORAL | 0 refills | Status: DC

## 2019-05-23 MED ORDER — GLIPIZIDE ER 10 MG PO TB24: 20.0000 mg | ORAL_TABLET | Freq: Every day | ORAL | 0 refills | Status: DC

## 2019-05-23 NOTE — Telephone Encounter (Signed)
Subjective:   Patient ID: Tammie Bush, female    DOB: 03-29-1971, 48 y.o.   MRN: [id number]  Tammie Bush is a 48 y.o. female presenting on 05/23/2019 for Diabetes, HLD  Virtual Visit via [place] connected with Roosvelt Harps Starling on 11/6/2020at  by MyChart.   I discussed the limitations, risks, security and privacy concerns of performing an evaluation and management service by telephone and the availability of in person appointments. I also discussed with the patient that there may be a patient responsible charge related to this service. The patient expressed understanding and agreed to proceed.  HPI  Patient has a history of diabetes and HLD. She is taking glipizide XL 20 mg QD for diabetes and she is not on a statin.   Lab Results  Component Value Date   HGBA1C 6.2 (H) 05/20/2019   Lipid Panel     Component Value Date/Time   CHOL 217 (H) 05/20/2019 1057   TRIG 141 05/20/2019 1057   HDL 43 05/20/2019 1057   CHOLHDL 5.0 (H) 05/20/2019 1057   CHOLHDL 4.6 05/07/2017 1652   LDLCALC 148 (H) 05/20/2019 1057   LDLCALC 126 (H) 05/07/2017 1652   LABVLDL 26 05/20/2019 1057      Social History   Tobacco Use  . Smoking status: Never Smoker  . Smokeless tobacco: Never Used  Substance Use Topics  . Alcohol use: No    Alcohol/week: 0.0 standard drinks  . Drug use: No   ROS Per HPI unless specifically indicated above   Objective:   Wt Readings from Last 3 Encounters:  01/14/19 225 lb 6.4 oz (102.2 kg)  01/13/19 227 lb (103 kg)  06/12/18 232 lb (105.2 kg)   Physical Exam  Results for orders placed or performed in visit on 05/13/19  CBC w/Diff/Platelet  Result Value Ref Range   WBC 9.7 3.4 - 10.8 x10E3/uL   RBC 5.26 3.77 - 5.28 x10E6/uL   Hemoglobin 14.3 11.1 - 15.9 g/dL   Hematocrit 44.4 34.0 - 46.6 %   MCV 84 79 - 97 fL   MCH 27.2 26.6 - 33.0 pg   MCHC 32.2 31.5 - 35.7 g/dL   RDW 13.6 11.7 - 15.4 %   Platelets 393 150 - 450 x10E3/uL   Neutrophils  59 Not Estab. %   Lymphs 33 Not Estab. %   Monocytes 5 Not Estab. %   Eos 2 Not Estab. %   Basos 1 Not Estab. %   Neutrophils Absolute 5.7 1.4 - 7.0 x10E3/uL   Lymphocytes Absolute 3.2 (H) 0.7 - 3.1 x10E3/uL   Monocytes Absolute 0.5 0.1 - 0.9 x10E3/uL   EOS (ABSOLUTE) 0.2 0.0 - 0.4 x10E3/uL   Basophils Absolute 0.1 0.0 - 0.2 x10E3/uL   Immature Granulocytes 0 Not Estab. %   Immature Grans (Abs) 0.0 0.0 - 0.1 x10E3/uL  Comprehensive Metabolic Panel (CMET)  Result Value Ref Range   Glucose 99 65 - 99 mg/dL   BUN 10 6 - 24 mg/dL   Creatinine, Ser 0.78 0.57 - 1.00 mg/dL   GFR calc non Af Amer 90 >59 mL/min/1.73   GFR calc Af Amer 104 >59 mL/min/1.73   BUN/Creatinine Ratio 13 9 - 23   Sodium 141 134 - 144 mmol/L   Potassium 3.8 3.5 - 5.2 mmol/L   Chloride 99 96 - 106 mmol/L   CO2 27 20 - 29 mmol/L   Calcium 9.4 8.7 - 10.2 mg/dL   Total Protein 7.0 6.0 -  8.5 g/dL   Albumin 4.2 3.8 - 4.8 g/dL   Globulin, Total 2.8 1.5 - 4.5 g/dL   Albumin/Globulin Ratio 1.5 1.2 - 2.2   Bilirubin Total 0.3 0.0 - 1.2 mg/dL   Alkaline Phosphatase 87 39 - 117 IU/L   AST 18 0 - 40 IU/L   ALT 16 0 - 32 IU/L  Lipid Profile  Result Value Ref Range   Cholesterol, Total 217 (H) 100 - 199 mg/dL   Triglycerides 952 0 - 149 mg/dL   HDL 43 >84 mg/dL   VLDL Cholesterol Cal 26 5 - 40 mg/dL   LDL Chol Calc (NIH) 132 (H) 0 - 99 mg/dL   Chol/HDL Ratio 5.0 (H) 0.0 - 4.4 ratio  HgB A1c  Result Value Ref Range   Hgb A1c MFr Bld 6.2 (H) 4.8 - 5.6 %   Est. average glucose Bld gHb Est-mCnc 131 mg/dL    Assessment & Plan:  1. Type 2 diabetes mellitus with hyperglycemia, without long-term current use of insulin (HCC)  Controlled, continue glipizide XL 20 mg QD.  2. Essential hypertension  Continue HCTZ 12.5 mg daily.  3. Hypercholesterolemia  Start Lipitor 10 mg QHS for uncontrolled HLD. Follow up 4-6 weeks for OV and labs with [facility]   I have spent 25 minutes non face-face time in the care of this patient.    Osvaldo Angst, PA-C [facility] 05/23/2019, 3:06 PM

## 2019-05-29 ENCOUNTER — Other Ambulatory Visit: Payer: Self-pay | Admitting: Physician Assistant

## 2019-05-29 DIAGNOSIS — I10 Essential (primary) hypertension: Secondary | ICD-10-CM

## 2019-06-09 ENCOUNTER — Other Ambulatory Visit: Payer: Self-pay | Admitting: *Deleted

## 2019-06-09 DIAGNOSIS — Z20822 Contact with and (suspected) exposure to covid-19: Secondary | ICD-10-CM

## 2019-06-11 LAB — NOVEL CORONAVIRUS, NAA: SARS-CoV-2, NAA: DETECTED — AB

## 2019-06-13 ENCOUNTER — Ambulatory Visit: Payer: Self-pay

## 2019-06-13 NOTE — Telephone Encounter (Signed)
Provided covid-19 test results.  Voiced understanding.  Provided  Care advice voiced understanding.   Reviewed isolation protocol.  Voiced understanding.   Encouraged to call PCP with questions. Voiced understanding.

## 2019-06-17 HISTORY — PX: HERNIA REPAIR: SHX51

## 2019-07-07 ENCOUNTER — Other Ambulatory Visit: Payer: Self-pay | Admitting: Physician Assistant

## 2019-07-07 DIAGNOSIS — E119 Type 2 diabetes mellitus without complications: Secondary | ICD-10-CM

## 2019-08-02 ENCOUNTER — Other Ambulatory Visit: Payer: Self-pay | Admitting: Physician Assistant

## 2019-08-02 DIAGNOSIS — E7800 Pure hypercholesterolemia, unspecified: Secondary | ICD-10-CM

## 2019-08-17 ENCOUNTER — Other Ambulatory Visit: Payer: Self-pay | Admitting: Physician Assistant

## 2019-08-17 DIAGNOSIS — E1165 Type 2 diabetes mellitus with hyperglycemia: Secondary | ICD-10-CM

## 2019-08-17 NOTE — Telephone Encounter (Signed)
Requested medication (s) are due for refill today: yes  Requested medication (s) are on the active medication list: yes  Last refill:  05/24/2019  Future visit scheduled:no  Notes to clinic: no valid encounter within last 6 months  Review for refill   Requested Prescriptions  Pending Prescriptions Disp Refills   glipiZIDE (GLUCOTROL XL) 10 MG 24 hr tablet [Pharmacy Med Name: GLIPIZIDE ER 10 MG TABLET] 180 tablet 0    Sig: Take 2 tablets (20 mg total) by mouth daily with breakfast.      Endocrinology:  Diabetes - Sulfonylureas Failed - 08/17/2019  9:38 AM      Failed - Valid encounter within last 6 months    Recent Outpatient Visits           7 months ago Type 2 diabetes mellitus without complication, without long-term current use of insulin [facility])   [facility] [place], [place], PA-C   10 months ago Type 2 diabetes mellitus without complication, without long-term current use of insulin [facility])   [facility] [place], [place]essandra Bevel[place], [place]   1 year ago Essential hypertension   [facility] [place], [place], [place]   1 year ago [facility], [place], [place]   1 year ago Essential hypertension   [facility] Joycelyn Man M, PA-C              Passed - HBA1C is between 0 and 7.9 and within 180 days    Hgb A1c MFr Bld  Date Value Ref Range Status  05/20/2019 6.2 (H) 4.8 - 5.6 % Final    Comment:             Prediabetes: 5.7 - 6.4          Diabetes: >6.4          Glycemic control for adults with diabetes: <7.0

## 2019-09-07 ENCOUNTER — Other Ambulatory Visit: Payer: Self-pay | Admitting: Physician Assistant

## 2019-09-07 DIAGNOSIS — R030 Elevated blood-pressure reading, without diagnosis of hypertension: Secondary | ICD-10-CM

## 2019-09-07 DIAGNOSIS — E7800 Pure hypercholesterolemia, unspecified: Secondary | ICD-10-CM

## 2019-09-09 ENCOUNTER — Other Ambulatory Visit: Payer: Self-pay | Admitting: Physician Assistant

## 2019-09-09 DIAGNOSIS — E7800 Pure hypercholesterolemia, unspecified: Secondary | ICD-10-CM

## 2019-10-07 ENCOUNTER — Other Ambulatory Visit: Payer: Self-pay | Admitting: Physician Assistant

## 2019-10-07 DIAGNOSIS — E7800 Pure hypercholesterolemia, unspecified: Secondary | ICD-10-CM

## 2019-10-07 DIAGNOSIS — E1165 Type 2 diabetes mellitus with hyperglycemia: Secondary | ICD-10-CM

## 2019-10-07 NOTE — Telephone Encounter (Signed)
Requested Prescriptions  Pending Prescriptions Disp Refills  . glipiZIDE (GLUCOTROL XL) 10 MG 24 hr tablet [Pharmacy Med Name: GLIPIZIDE ER 10 MG TABLET] 180 tablet 0    Sig: TAKE 2 TABLETS (20 MG TOTAL) BY MOUTH DAILY WITH BREAKFAST.     Endocrinology:  Diabetes - Sulfonylureas Failed - 10/07/2019  7:00 PM      Failed - Valid encounter within last 6 months    Recent Outpatient Visits          8 months ago Type 2 diabetes mellitus without complication, without long-term current use of insulin [facility])   [facility] [place], [place], [place]   12 months ago Type 2 diabetes mellitus without complication, without long-term current use of insulin [facility])   [facility] [place], [place], [place]   1 year ago Essential hypertension   [place], [place], [place]   1 year ago [facility], [place], [place]   1 year ago Essential hypertension   [facility] [place] M, PA-C             Passed - HBA1C is between 0 and 7.9 and within 180 days    Hgb A1c MFr Bld  Date Value Ref Range Status  05/20/2019 6.2 (H) 4.8 - 5.6 % Final    Comment:             Prediabetes: 5.7 - 6.4          Diabetes: >6.4          Glycemic control for adults with diabetes: <7.0          . atorvastatin (LIPITOR) 10 MG tablet [Pharmacy Med Name: ATORVASTATIN 10 MG TABLET] 90 tablet 0    Sig: TAKE 1 TABLET BY MOUTH EVERY DAY     Cardiovascular:  Antilipid - Statins Failed - 10/07/2019  7:00 PM      Failed - Total Cholesterol in normal range and within 360 days    Cholesterol, Total  Date Value Ref Range Status  05/20/2019 217 (H) 100 - 199 mg/dL Final         Failed - LDL in normal range and within 360 days    LDL Cholesterol (Calc)  Date Value Ref Range Status  05/07/2017 126 (H) mg/dL (calc) Final    Comment:    Reference range: <100 . Desirable range <100 mg/dL for primary prevention;    <70 mg/dL for patients with CHD or diabetic patients  with > or = 2 CHD risk factors. Marland Kitchen LDL-C is now calculated using the Martin-Hopkins  calculation, which is a validated novel method providing  better accuracy than the Friedewald equation in the  estimation of LDL-C.  Cresenciano Genre et al. Annamaria Helling. 9485;462(70): 2061-2068  (http://education.[facility].com/faq/FAQ164)    LDL Chol Calc (NIH)  Date Value Ref Range Status  05/20/2019 148 (H) 0 - 99 mg/dL Final         Passed - HDL in normal range and within 360 days    HDL  Date Value Ref Range Status  05/20/2019 43 >39 mg/dL Final         Passed - Triglycerides in normal range and within 360 days    Triglycerides  Date Value Ref Range Status  05/20/2019 141 0 - 149 mg/dL Final         Passed - Patient is not pregnant      Passed - Valid encounter within last 12 months  Recent Outpatient Visits          8 months ago Type 2 diabetes mellitus without complication, without long-term current use of insulin [facility])   [facility] [place], [place], [place]   12 months ago Type 2 diabetes mellitus without complication, without long-term current use of insulin [facility])   [facility] [place], Alessandra [place], [place]   1 year ago Essential hypertension   [facility] [place], [place], [place]   1 year ago [facility], [place]andra [place], [place]   1 year ago Essential hypertension   [facility] [place], [place], [place]

## 2019-10-08 ENCOUNTER — Other Ambulatory Visit: Payer: Self-pay | Admitting: Physician Assistant

## 2019-10-08 DIAGNOSIS — E1165 Type 2 diabetes mellitus with hyperglycemia: Secondary | ICD-10-CM

## 2019-10-12 ENCOUNTER — Telehealth: Payer: BC Managed Care – PPO | Admitting: Family

## 2019-10-12 DIAGNOSIS — L0291 Cutaneous abscess, unspecified: Secondary | ICD-10-CM | POA: Diagnosis not present

## 2019-10-12 MED ORDER — DOXYCYCLINE HYCLATE 100 MG PO TABS: 100.0000 mg | ORAL_TABLET | Freq: Two times a day (BID) | ORAL | 0 refills | Status: DC

## 2019-10-12 NOTE — Progress Notes (Signed)
E Visit for Cellulitis  We are sorry that you are not feeling well. Here is how we plan to help!  Based on what you shared with me it looks like you have cellulitis.  Cellulitis looks like areas of skin redness, swelling, and warmth; it develops as a result of bacteria entering under the skin. Little red spots and/or bleeding can be seen in skin, and tiny surface sacs containing fluid can occur. Fever can be present. Cellulitis is almost always on one side of a body, and the lower limbs are the most common site of involvement.   I have prescribed:  Doxycyline 100 mg twice a day for 10 days.   HOME CARE:  . Take your medications as ordered and take all of them, even if the skin irritation appears to be healing.   GET HELP RIGHT AWAY IF:  . Symptoms that don't begin to go away within 48 hours. . Severe redness persists or worsens . If the area turns color, spreads or swells. . If it blisters and opens, develops yellow-brown crust or bleeds. . You develop a fever or chills. . If the pain increases or becomes unbearable.  . Are unable to keep fluids and food down.  MAKE SURE YOU    Understand these instructions.  Will watch your condition.  Will get help right away if you are not doing well or get worse.  Thank you for choosing an e-visit. Your e-visit answers were reviewed by a board certified advanced clinical practitioner to complete your personal care plan. Depending upon the condition, your plan could have included both over the counter or prescription medications. Please review your pharmacy choice. Make sure the pharmacy is open so you can pick up prescription now. If there is a problem, you may contact your provider through [employer] and have the prescription routed to another pharmacy. Your safety is important to [place]. If you have drug allergies check your prescription carefully.  For the next 24 hours you can use MyChart to ask questions about today's visit, request a  non-urgent call back, or ask for a work or school excuse. You will get an email in the next two days asking about your experience. I hope that your e-visit has been valuable and will speed your recovery.   Approximately 5 minutes was spent documenting and reviewing patient's chart.

## 2019-10-13 ENCOUNTER — Other Ambulatory Visit: Payer: Self-pay | Admitting: Physician Assistant

## 2019-10-13 ENCOUNTER — Encounter: Payer: Self-pay | Admitting: Physician Assistant

## 2019-10-13 DIAGNOSIS — E1165 Type 2 diabetes mellitus with hyperglycemia: Secondary | ICD-10-CM

## 2019-10-13 MED ORDER — GLIPIZIDE ER 10 MG PO TB24: 20.0000 mg | ORAL_TABLET | Freq: Every day | ORAL | 3 refills | Status: DC

## 2019-10-13 NOTE — Telephone Encounter (Signed)
Notified pt regarding the request for GLIPIZIDE ER 10 MG TABLET. LR 08/18/19 with an end date of 11/16/19. She was told by her pharmacy that she needs a new prescription. She does not need a refill but wanted her pcp to know that her prescription will expire in May.. She is advised that all she needs to do is send it a refill request when timed for a refill and her provider should  Take care of her refills. She voiced understanding.

## 2019-11-01 ENCOUNTER — Encounter: Payer: Self-pay | Admitting: Physician Assistant

## 2019-11-01 ENCOUNTER — Other Ambulatory Visit: Payer: Self-pay | Admitting: Physician Assistant

## 2019-11-01 ENCOUNTER — Other Ambulatory Visit: Payer: Self-pay

## 2019-11-01 DIAGNOSIS — L304 Erythema intertrigo: Secondary | ICD-10-CM

## 2019-11-01 DIAGNOSIS — L089 Local infection of the skin and subcutaneous tissue, unspecified: Secondary | ICD-10-CM

## 2019-11-01 NOTE — Telephone Encounter (Signed)
LOV last one I see is 12/2018

## 2019-11-03 MED ORDER — KETOCONAZOLE 2 % EX CREA: 1.0000 "application " | TOPICAL_CREAM | Freq: Every day | CUTANEOUS | 0 refills | Status: DC

## 2019-11-10 ENCOUNTER — Other Ambulatory Visit: Payer: Self-pay | Admitting: Certified Nurse Midwife

## 2019-11-10 DIAGNOSIS — Z1231 Encounter for screening mammogram for malignant neoplasm of breast: Secondary | ICD-10-CM

## 2019-12-19 NOTE — Progress Notes (Signed)
Established patient visit   Patient: Tammie Bush   DOB: 1971-05-29   49 y.o. Female  MRN: [id number] Visit Date: 12/22/2019  Today's healthcare provider: Margaretann Loveless, PA-C   No chief complaint on file.  Subjective    HPI Patient here with c/o feeling fatigue. Feels like she is not having any energy. Reports that she sleeps between 6-7 hours.   Chest tightness: Reports that she gets this chest tightness when she lifts her arms up. Not sure if it is related to her job or something else. Family history of cardiovascular disease.  Rash: She uses the Ketoconazole cream that she has been prescribed. Reports that she has some on her bottom, under her stomach and breast. States that she might need something stronger. Reports that she puts powder and it helps but if she doesn't put it for 2 days it gets red and worse.  She also concern that she keeps getting boils, bites.Reports that she has been to the ED for this. She had one lanced. She was put on Doxycycline.  Constipation: she reports that she had hernia repair and gallbladder. She had a Colonoscopy and Endoscopy within the last year.Reports that if she goes to eat with her mother 30 minutes within she goes to the bathroom and goes up to 5 times but then it does the opposite until it hurts to have a bowel movement because the stool is very hard. She had an episode on Friday she had a large bowel movement and the stool was loose. Reports that she gets very bloated and she goes from one stream to the other.   Patient Active Problem List   Diagnosis Date Noted   Calcaneal spur 06/12/2018   Plantar fasciitis, bilateral 06/12/2018   Hypothyroidism 12/06/2017   Class 2 severe obesity due to excess calories with serious comorbidity and body mass index (BMI) of 36.0 to 36.9 in adult (HCC) 11/10/2016   BPPV (benign paroxysmal positional vertigo) 09/28/2015   Diabetes mellitus (HCC) 06/02/2015   Hypercholesterolemia  06/02/2015   Essential hypertension 05/05/2015   Past Medical History:  Diagnosis Date   Anxiety    Diabetes mellitus, type II (HCC)    Dysmenorrhea    Hypertension    Hypothyroidism    Menorrhagia    Morbid obesity (HCC)    Vertigo        Medications: Outpatient Medications Prior to Visit  Medication Sig   amLODipine (NORVASC) 5 MG tablet TAKE 1 TABLET BY MOUTH EVERY DAY   atorvastatin (LIPITOR) 10 MG tablet TAKE 1 TABLET BY MOUTH EVERY DAY   glipiZIDE (GLUCOTROL XL) 10 MG 24 hr tablet Take 2 tablets (20 mg total) by mouth daily with breakfast.   hydrochlorothiazide (MICROZIDE) 12.5 MG capsule TAKE 1 CAPSULE BY MOUTH EVERY DAY   ketoconazole (NIZORAL) 2 % cream Apply 1 application topically daily.   meclizine (ANTIVERT) 25 MG tablet Take 1 tablet (25 mg total) by mouth 3 (three) times daily as needed for dizziness.   mupirocin ointment (BACTROBAN) 2 % Apply 1 application topically 2 (two) times daily.   Omega-3 Fatty Acids (FISH OIL) 1200 MG CAPS Take 1 capsule by mouth daily.   Sennosides-Docusate Sodium (SENNA-DOCUSATE SODIUM PO) Take by mouth as needed.    doxycycline (VIBRA-TABS) 100 MG tablet Take 1 tablet (100 mg total) by mouth 2 (two) times daily.   omeprazole (PRILOSEC) 20 MG capsule TAKE 1 CAPSULE (20 MG TOTAL) BY MOUTH 2 (TWO) TIMES DAILY BEFORE A  MEAL.   No facility-administered medications prior to visit.    Review of Systems  Constitutional: Positive for fatigue.  HENT: Negative.   Respiratory: Positive for chest tightness. Negative for cough and shortness of breath.   Cardiovascular: Negative for chest pain, palpitations and leg swelling.  Gastrointestinal: Positive for abdominal distention, abdominal pain ("sometimes"), constipation and diarrhea. Negative for anal bleeding, blood in stool, nausea and vomiting.  Skin: Positive for rash and wound.  Neurological: Negative.     Last CBC Lab Results  Component Value Date   WBC 11.2 (H)  12/22/2019   HGB 14.1 12/22/2019   HCT 41.8 12/22/2019   MCV 86 12/22/2019   MCH 28.9 12/22/2019   RDW 13.8 12/22/2019   PLT 355 61/60/7371   Last metabolic panel Lab Results  Component Value Date   GLUCOSE 114 (H) 12/22/2019   NA 140 12/22/2019   K 3.8 12/22/2019   CL 100 12/22/2019   CO2 24 12/22/2019   BUN 12 12/22/2019   CREATININE 0.67 12/22/2019   GFRNONAA 104 12/22/2019   GFRAA 119 12/22/2019   CALCIUM 9.8 12/22/2019   PROT 7.0 05/20/2019   ALBUMIN 4.2 05/20/2019   LABGLOB 2.8 05/20/2019   AGRATIO 1.5 05/20/2019   BILITOT 0.3 05/20/2019   ALKPHOS 87 05/20/2019   AST 18 05/20/2019   ALT 16 05/20/2019   Last lipids Lab Results  Component Value Date   CHOL 173 12/22/2019   HDL 48 12/22/2019   LDLCALC 103 (H) 12/22/2019   TRIG 123 12/22/2019   CHOLHDL 5.0 (H) 05/20/2019   Last hemoglobin A1c Lab Results  Component Value Date   HGBA1C 6.4 (H) 12/22/2019      Objective    BP 140/90 (BP Location: Left Arm, Patient Position: Sitting, Cuff Size: Large) Comment: she has not taken her medicine this AM   Pulse 80    Temp (!) 97.1 F (36.2 C)    Resp 16    Wt 231 lb 12.8 oz (105.1 kg)    BMI 37.41 kg/m  BP Readings from Last 3 Encounters:  12/22/19 140/90  01/14/19 (!) 141/86  01/13/19 108/66   Wt Readings from Last 3 Encounters:  12/22/19 231 lb 12.8 oz (105.1 kg)  01/14/19 225 lb 6.4 oz (102.2 kg)  01/13/19 227 lb (103 kg)      Physical Exam Vitals reviewed.  Constitutional:      General: She is not in acute distress.    Appearance: Normal appearance. She is well-developed. She is obese. She is not ill-appearing or diaphoretic.  HENT:     Head: Normocephalic and atraumatic.  Eyes:     General: No scleral icterus. Cardiovascular:     Rate and Rhythm: Normal rate and regular rhythm.     Pulses: Normal pulses.     Heart sounds: Normal heart sounds. No murmur. No friction rub. No gallop.   Pulmonary:     Effort: Pulmonary effort is normal. No  respiratory distress.     Breath sounds: Normal breath sounds. No wheezing or rales.  Abdominal:     General: Abdomen is flat. Bowel sounds are normal. There is no distension.     Palpations: Abdomen is soft. There is no mass.     Tenderness: There is generalized abdominal tenderness. There is no right CVA tenderness, left CVA tenderness, guarding or rebound.  Musculoskeletal:     Cervical back: Normal range of motion and neck supple.  Skin:    General: Skin is warm and dry.  Capillary Refill: Capillary refill takes less than 2 seconds.  Neurological:     General: No focal deficit present.     Mental Status: She is alert and oriented to person, place, and time. Mental status is at baseline.      No results found for any visits on 12/22/19.  Assessment & Plan     1. Type 2 diabetes mellitus with hyperglycemia, without long-term current use of insulin (HCC) Stable. Continue Glipizide XR 10mg . Will check labs as below and f/u pending results. - Basic Metabolic Panel (BMET) - HgB A1c - Lipid Panel With LDL/HDL Ratio  2. Essential hypertension Stable. Continue Amlodipine 5mg , HCTZ 12.5mg . Will check labs as below and f/u pending results. - Basic Metabolic Panel (BMET) - HgB A1c - Lipid Panel With LDL/HDL Ratio  3. Hypothyroidism due to acquired atrophy of thyroid Will check labs as below and f/u pending results. - TSH  4. Intertrigo Noted in groin and under stomach folds and under breast. Will give nystatin powder and diflucan. Use diflucan first to calm down and then Nystatin powder to keep from recurring. Keep areas as clean and dry as possible.  - fluconazole (DIFLUCAN) 150 MG tablet; Take 1 tablet (150 mg total) by mouth daily.  Dispense: 7 tablet; Refill: 0  5. Skin sore Multiple sores. Suspect possible MRSA carrier. Is currently on doxycycline. Will check MRSA/MSSA nasal screen. If positive will give Mupirocin nasal dose.  - CBC w/Diff/Platelet - MRSA MSSA Screening  Culture  6. Chronic idiopathic constipation Will give her sample of Linzess 72 mcg to allow her to start having BM. Once started then transition back to OTC stool softener and take daily. Call if constipation persists on stool softener.   7. Chest tightness Most likely MSK as it occurs when she moves her arms. No associated nausea, lightheadedness or diaphoresis. Does not occur with all activity. Will monitor. May refer to cardiology for reassurance if symptoms persist.   8. Chronic fatigue Will check labs as below and f/u pending results.  9. Class 2 severe obesity due to excess calories with serious comorbidity and body mass index (BMI) of 37.0 to 37.9 in adult [facility]) Counseled patient on healthy lifestyle modifications including dieting and exercise.  Will check labs as below and f/u pending results.  - Lipid Panel With LDL/HDL Ratio  10. Pure hypercholesterolemia Continue atorvastatin 10mg . Will check labs as below and f/u pending results. - Basic Metabolic Panel (BMET) - HgB A1c - Lipid Panel With LDL/HDL Ratio  11. Hypokalemia H/O this. Will check labs as below and f/u pending results. - Basic Metabolic Panel (BMET)  12. Leukemoid reaction H/O this. Will check labs as below and f/u pending results. - CBC w/Diff/Platelet  13. Gastroesophageal reflux disease without esophagitis Stable. Diagnosis pulled for medication refill. Continue current medical treatment plan. - omeprazole (PRILOSEC) 20 MG capsule; Take 1 capsule (20 mg total) by mouth 2 (two) times daily before a meal.  Dispense: 180 capsule; Refill: 1   No follow-ups on file.      , PA-C, have reviewed all documentation for this visit. The documentation on 12/24/19 for the exam, diagnosis, procedures, and orders are all accurate and complete.    [facility] [phone] (phone) [phone] (fax)  [facility]

## 2019-12-22 ENCOUNTER — Encounter: Payer: Self-pay | Admitting: Physician Assistant

## 2019-12-22 ENCOUNTER — Ambulatory Visit: Payer: BC Managed Care – PPO | Admitting: Physician Assistant

## 2019-12-22 ENCOUNTER — Other Ambulatory Visit: Payer: Self-pay

## 2019-12-22 VITALS — BP 140/90 | HR 80 | Temp 97.1°F | Resp 16 | Wt 231.8 lb

## 2019-12-22 DIAGNOSIS — Z6837 Body mass index (BMI) 37.0-37.9, adult: Secondary | ICD-10-CM

## 2019-12-22 DIAGNOSIS — L304 Erythema intertrigo: Secondary | ICD-10-CM | POA: Diagnosis not present

## 2019-12-22 DIAGNOSIS — R5382 Chronic fatigue, unspecified: Secondary | ICD-10-CM

## 2019-12-22 DIAGNOSIS — R0789 Other chest pain: Secondary | ICD-10-CM

## 2019-12-22 DIAGNOSIS — I10 Essential (primary) hypertension: Secondary | ICD-10-CM | POA: Diagnosis not present

## 2019-12-22 DIAGNOSIS — E034 Atrophy of thyroid (acquired): Secondary | ICD-10-CM | POA: Diagnosis not present

## 2019-12-22 DIAGNOSIS — E876 Hypokalemia: Secondary | ICD-10-CM

## 2019-12-22 DIAGNOSIS — E7800 Pure hypercholesterolemia, unspecified: Secondary | ICD-10-CM

## 2019-12-22 DIAGNOSIS — L989 Disorder of the skin and subcutaneous tissue, unspecified: Secondary | ICD-10-CM

## 2019-12-22 DIAGNOSIS — D72823 Leukemoid reaction: Secondary | ICD-10-CM

## 2019-12-22 DIAGNOSIS — K5904 Chronic idiopathic constipation: Secondary | ICD-10-CM

## 2019-12-22 DIAGNOSIS — Z22322 Carrier or suspected carrier of Methicillin resistant Staphylococcus aureus: Secondary | ICD-10-CM

## 2019-12-22 DIAGNOSIS — E1165 Type 2 diabetes mellitus with hyperglycemia: Secondary | ICD-10-CM | POA: Diagnosis not present

## 2019-12-22 DIAGNOSIS — K219 Gastro-esophageal reflux disease without esophagitis: Secondary | ICD-10-CM

## 2019-12-22 MED ORDER — OMEPRAZOLE 20 MG PO CPDR: 20.0000 mg | DELAYED_RELEASE_CAPSULE | Freq: Two times a day (BID) | ORAL | 1 refills | Status: DC

## 2019-12-22 MED ORDER — NYSTATIN 100000 UNIT/GM EX POWD: 1.0000 "application " | Freq: Three times a day (TID) | CUTANEOUS | 0 refills | Status: DC

## 2019-12-22 MED ORDER — FLUCONAZOLE 150 MG PO TABS: 150.0000 mg | ORAL_TABLET | Freq: Every day | ORAL | 0 refills | Status: DC

## 2019-12-22 MED ORDER — NYSTATIN 100000 UNIT/GM EX POWD: 1.0000 "application " | Freq: Three times a day (TID) | CUTANEOUS | 1 refills | Status: DC

## 2019-12-22 NOTE — Patient Instructions (Signed)
Constipation, Adult Constipation is when a person:  Poops (has a bowel movement) fewer times in a week than normal.  Has a hard time pooping.  Has poop that is dry, hard, or bigger than normal. Follow these instructions at home: Eating and drinking   Eat foods that have a lot of fiber, such as: ? Fresh fruits and vegetables. ? Whole grains. ? Beans.  Eat less of foods that are high in fat, low in fiber, or overly processed, such as: ? [place] fries. ? Hamburgers. ? Cookies. ? Candy. ? Soda.  Drink enough fluid to keep your pee (urine) clear or pale yellow. General instructions  Exercise regularly or as told by your doctor.  Go to the restroom when you feel like you need to poop. Do not hold it in.  Take over-the-counter and prescription medicines only as told by your doctor. These include any fiber supplements.  Do pelvic floor retraining exercises, such as: ? Doing deep breathing while relaxing your lower belly (abdomen). ? Relaxing your pelvic floor while pooping.  Watch your condition for any changes.  Keep all follow-up visits as told by your doctor. This is important. Contact a doctor if:  You have pain that gets worse.  You have a fever.  You have not pooped for 4 days.  You throw up (vomit).  You are not hungry.  You lose weight.  You are bleeding from the anus.  You have thin, pencil-like poop (stool). Get help right away if:  You have a fever, and your symptoms suddenly get worse.  You leak poop or have blood in your poop.  Your belly feels hard or bigger than normal (is bloated).  You have very bad belly pain.  You feel dizzy or you faint. This information is not intended to replace advice given to you by your health care provider. Make sure you discuss any questions you have with your health care provider. Document Revised: 06/15/2017 Document Reviewed: 12/22/2015 Elsevier Patient Education  2020 Elsevier Inc.  Celesta Aver Intertrigo  is skin irritation (inflammation) that happens in warm, moist areas of the body. The irritation can cause a rash and make skin raw and itchy. The rash is usually pink or red. It happens mostly between folds of skin or where skin rubs together, such as:  Between the toes.  In the armpits.  In the groin area.  Under the belly.  Under the breasts.  Around the butt area. This condition is not passed from person to person (is not contagious). What are the causes?  Heat, moisture, rubbing, and not enough air movement.  The condition can be made worse by: ? Sweat. ? Bacteria. ? A fungus, such as yeast. What increases the risk?  Moisture in your skin folds.  You are more likely to develop this condition if you: ? Have diabetes. ? Are overweight. ? Are not able to move around. ? Live in a warm and moist climate. ? Wear splints, braces, or other medical devices. ? Are not able to control your pee (urine) or poop (stool). What are the signs or symptoms?  A pink or red skin rash in the skin fold or near the skin fold.  Raw or scaly skin.  Itching.  A burning feeling.  Bleeding.  Leaking fluid.  A bad smell. How is this treated?  Cleaning and drying your skin.  Taking an antibiotic medicine or using an antibiotic skin cream for a bacterial infection.  Using an antifungal cream on your  skin or taking pills for an infection that was caused by a fungus, such as yeast.  Using a steroid ointment to stop the itching and irritation.  Separating the skin fold with a clean cotton cloth to absorb moisture and allow air to flow into the area. Follow these instructions at home:  Keep the affected area clean and dry.  Do not scratch your skin.  Stay cool as much as you can. Use an air conditioner or a fan, if you have one.  Apply over-the-counter and prescription medicines only as told by your doctor.  If you were prescribed an antibiotic medicine, use it as told by your  doctor. Do not stop using the antibiotic even if your condition starts to get better.  Keep all follow-up visits as told by your doctor. This is important. How is this prevented?   Stay at a healthy weight.  Take care of your feet. This is very important if you have diabetes. You should: ? Wear shoes that fit well. ? Keep your feet dry. ? Wear clean cotton or wool socks.  Protect the skin in your groin and butt area as told by your doctor. To do this: ? Follow a regular cleaning routine. ? Use creams, powders, or ointments that protect your skin. ? Change protection pads often.  Do not wear tight clothes. Wear clothes that: ? Are loose. ? Take moisture away from your body. ? Are made of cotton.  Wear a bra that gives good support, if needed.  Shower and dry yourself well after being active. Use a hair dryer on a cool setting to dry between skin folds.  Keep your blood sugar under control if you have diabetes. Contact a doctor if:  Your symptoms do not get better with treatment.  Your symptoms get worse or they spread.  You notice more redness and warmth.  You have a fever. Summary  Intertrigo is skin irritation that occurs when folds of skin rub together.  This condition is caused by heat, moisture, and rubbing.  This condition may be treated by cleaning and drying your skin and with medicines.  Apply over-the-counter and prescription medicines only as told by your doctor.  Keep all follow-up visits as told by your doctor. This is important. This information is not intended to replace advice given to you by your health care provider. Make sure you discuss any questions you have with your health care provider. Document Revised: 04/11/2018 Document Reviewed: 04/11/2018 Elsevier Patient Education  2020 [employer].

## 2019-12-23 ENCOUNTER — Telehealth: Payer: Self-pay

## 2019-12-23 DIAGNOSIS — E039 Hypothyroidism, unspecified: Secondary | ICD-10-CM

## 2019-12-23 LAB — BASIC METABOLIC PANEL
BUN/Creatinine Ratio: 18 (ref 9–23)
BUN: 12 mg/dL (ref 6–24)
CO2: 24 mmol/L (ref 20–29)
Calcium: 9.8 mg/dL (ref 8.7–10.2)
Chloride: 100 mmol/L (ref 96–106)
Creatinine, Ser: 0.67 mg/dL (ref 0.57–1.00)
GFR calc Af Amer: 119 mL/min/{1.73_m2} (ref >59)
GFR calc non Af Amer: 104 mL/min/{1.73_m2} (ref >59)
Glucose: 114 mg/dL — ABNORMAL HIGH (ref 65–99)
Potassium: 3.8 mmol/L (ref 3.5–5.2)
Sodium: 140 mmol/L (ref 134–144)

## 2019-12-23 LAB — CBC WITH DIFFERENTIAL/PLATELET
Basophils Absolute: 0.1 10*3/uL (ref 0.0–0.2)
Basos: 0 %
EOS (ABSOLUTE): 0.6 10*3/uL — ABNORMAL HIGH (ref 0.0–0.4)
Eos: 5 %
Hematocrit: 41.8 % (ref 34.0–46.6)
Hemoglobin: 14.1 g/dL (ref 11.1–15.9)
Immature Grans (Abs): 0 10*3/uL (ref 0.0–0.1)
Immature Granulocytes: 0 %
Lymphocytes Absolute: 4.1 10*3/uL — ABNORMAL HIGH (ref 0.7–3.1)
Lymphs: 36 %
MCH: 28.9 pg (ref 26.6–33.0)
MCHC: 33.7 g/dL (ref 31.5–35.7)
MCV: 86 fL (ref 79–97)
Monocytes Absolute: 0.6 10*3/uL (ref 0.1–0.9)
Monocytes: 5 %
Neutrophils Absolute: 5.9 10*3/uL (ref 1.4–7.0)
Neutrophils: 54 %
Platelets: 355 10*3/uL (ref 150–450)
RBC: 4.88 x10E6/uL (ref 3.77–5.28)
RDW: 13.8 % (ref 11.7–15.4)
WBC: 11.2 10*3/uL — ABNORMAL HIGH (ref 3.4–10.8)

## 2019-12-23 LAB — TSH: TSH: 4.52 u[IU]/mL — ABNORMAL HIGH (ref 0.450–4.500)

## 2019-12-23 LAB — LIPID PANEL WITH LDL/HDL RATIO
Cholesterol, Total: 173 mg/dL (ref 100–199)
HDL: 48 mg/dL (ref >39)
LDL Chol Calc (NIH): 103 mg/dL — ABNORMAL HIGH (ref 0–99)
LDL/HDL Ratio: 2.1 ratio (ref 0.0–3.2)
Triglycerides: 123 mg/dL (ref 0–149)
VLDL Cholesterol Cal: 22 mg/dL (ref 5–40)

## 2019-12-23 LAB — HEMOGLOBIN A1C
Est. average glucose Bld gHb Est-mCnc: 137 mg/dL
Hgb A1c MFr Bld: 6.4 % — ABNORMAL HIGH (ref 4.8–5.6)

## 2019-12-23 MED ORDER — LEVOTHYROXINE SODIUM 25 MCG PO TABS: 25.0000 ug | ORAL_TABLET | Freq: Every day | ORAL | 2 refills | Status: DC

## 2019-12-23 NOTE — Telephone Encounter (Signed)
-----   Message from Margaretann Loveless, [place] sent at 12/23/2019 12:07 PM EDT ----- Blood count is stable for you. You have consistent borderline elevated white blood cell count. Essentially stable over last 4 years. Kidney function is normal. Sodium, potassium and calcium are normal. Thyroid is borderline underactive. Could start levothyroxine and recheck in 6-8 weeks. A1c is doing well and is at 6.4. Cholesterol is also much improved compared to last year. Continue atorvastatin and healthy lifestyle modifications. The nasal swab is still pending.

## 2019-12-23 NOTE — Telephone Encounter (Signed)
Prescription sent into pharmacy

## 2019-12-23 NOTE — Telephone Encounter (Signed)
LMTCB if patient calls back OK for [facility] nurse to give results 

## 2019-12-23 NOTE — Telephone Encounter (Signed)
Patient called and was read lab note by Joycelyn Man PA-C written 12/23/19.  She verbalized understanding.  She agrees to start levothyroxine 25 mcg per note. Please sent order to [facility]. She states she will have lab redrawn the first week of August. Please place orders.

## 2019-12-25 ENCOUNTER — Telehealth: Payer: Self-pay

## 2019-12-25 ENCOUNTER — Encounter: Payer: Self-pay | Admitting: Physician Assistant

## 2019-12-25 LAB — RESULT

## 2019-12-25 LAB — MRSA MSSA SCREENING CULTURE

## 2019-12-25 MED ORDER — MUPIROCIN CALCIUM 2 % NA OINT: 1.0000 "application " | TOPICAL_OINTMENT | Freq: Two times a day (BID) | NASAL | 0 refills | Status: DC

## 2019-12-25 NOTE — Telephone Encounter (Signed)
LMTCB-If patient calls back [facility] for [facility] nurse to give results.

## 2019-12-25 NOTE — Telephone Encounter (Signed)
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 Continue the doxycycline for the open sores/boils. MRSA is probably why you are having those outbreaks. I will send in Mupirocin for you to use in your nose to treat you being a carrier.

## 2019-12-25 NOTE — Addendum Note (Signed)
Addended by: Margaretann Loveless on: 12/25/2019 11:39 AM   Modules accepted: Orders

## 2019-12-25 NOTE — Telephone Encounter (Signed)
Pt called back saying Josie called her regarding her results.  Please call patient back when you get in from lunch.  [phone]

## 2019-12-25 NOTE — Telephone Encounter (Signed)
Pt returned the call and was given the message from Joycelyn Man, PA-C dated 12/25/2019 at 11:37 AM pertaining to the MRSA result.  Pt saw the message on her MyChart.

## 2019-12-31 ENCOUNTER — Encounter: Payer: Self-pay | Admitting: Physician Assistant

## 2019-12-31 DIAGNOSIS — L0292 Furuncle, unspecified: Secondary | ICD-10-CM

## 2020-01-01 MED ORDER — DOXYCYCLINE HYCLATE 100 MG PO TABS: 100.0000 mg | ORAL_TABLET | Freq: Two times a day (BID) | ORAL | 0 refills | Status: DC

## 2020-01-23 ENCOUNTER — Other Ambulatory Visit (HOSPITAL_COMMUNITY)
Admission: RE | Admit: 2020-01-23 | Discharge: 2020-01-23 | Disposition: A | Discharge status: Home / Self Care | Payer: BC Managed Care – PPO | Source: Ambulatory Visit | Attending: Obstetrics | Admitting: Obstetrics

## 2020-01-23 ENCOUNTER — Ambulatory Visit (INDEPENDENT_AMBULATORY_CARE_PROVIDER_SITE_OTHER): Payer: BC Managed Care – PPO | Admitting: Obstetrics

## 2020-01-23 ENCOUNTER — Telehealth: Payer: Self-pay

## 2020-01-23 ENCOUNTER — Encounter: Payer: Self-pay | Admitting: Physician Assistant

## 2020-01-23 ENCOUNTER — Other Ambulatory Visit: Payer: Self-pay

## 2020-01-23 ENCOUNTER — Encounter: Payer: Self-pay | Admitting: Obstetrics

## 2020-01-23 VITALS — BP 128/82 | Ht 66.0 in | Wt 236.0 lb

## 2020-01-23 DIAGNOSIS — Z01419 Encounter for gynecological examination (general) (routine) without abnormal findings: Secondary | ICD-10-CM | POA: Diagnosis present

## 2020-01-23 DIAGNOSIS — Z124 Encounter for screening for malignant neoplasm of cervix: Secondary | ICD-10-CM

## 2020-01-23 DIAGNOSIS — L304 Erythema intertrigo: Secondary | ICD-10-CM

## 2020-01-23 MED ORDER — FLUCONAZOLE 150 MG PO TABS: 150.0000 mg | ORAL_TABLET | Freq: Every day | ORAL | 0 refills | Status: DC

## 2020-01-23 NOTE — Progress Notes (Signed)
Gynecology Annual Exam  PCP: Margaretann Loveless, PA-C  Chief Complaint:  Chief Complaint  Patient presents with  . Gynecologic Exam    sm. rash on butt    History of Present Illness: Tammie Bush is a 48 y.o. [id number] presents for annual exam. The patient complains of some small red bumps on her bottom.   Last pap smear: 2020, results were NILM   The patient is  sexually active. She currently uses nothing for contraception. She had a hysterectomy.She does not have dyspareunia.  Since her last visit, she has had no significant changes in her health.  Her past medical history is remarkable for menorrhagia with hysterectomy; high BMI, diabetes, hypertension and hyperlipidemia  The patient does perform self breast exams. Her last mammogram was 2020, results were normal exam.   There is a family history of breast cancer in her sister, who had radiation. Genetic testing has not been done. She is offered [facility] testing, and she declines.  There is no family history of ovarian cancer. Genetic testing has not been done.  The patient denies smoking.  She denies drinking.   She denies illegal drug use.  The patient does not exercise.  The patient denies current symptoms of depression.    Review of Systems: ROS  Past Medical History:  Past Medical History:  Diagnosis Date  . Anxiety   . Diabetes mellitus, type II (HCC)   . Dysmenorrhea   . Hypertension   . Hypothyroidism   . Menorrhagia   . Morbid obesity (HCC)   . Vertigo     Past Surgical History:  Past Surgical History:  Procedure Laterality Date  . ABDOMINAL HYSTERECTOMY    . CHOLECYSTECTOMY  09/2017   [facility]  . CRYOABLATION  08/24/2010   Her Option  . HERNIA REPAIR  06/2019  . [facility]  04/13/2011   menorrhagia  . MASTOID DEBRIDEMENT     age 50  . OVARIAN CYST REMOVAL  04/13/2011  . TONSILLECTOMY  1977  . TUBAL LIGATION  2001   postpartum  . WISDOM TOOTH EXTRACTION      Family History:  Family  History  Problem Relation Age of Onset  . Diabetes Mother   . Hypertension Mother   . Breast cancer Sister 65  . Heart disease Maternal Grandmother   . Lung cancer Maternal Grandfather     Social History:  Social History   Socioeconomic History  . Marital status: Legally Separated    Spouse name: Not on file  . Number of children: 4  . Years of education: Not on file  . Highest education level: Not on file  Occupational History  . 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 Smoking status: Never Smoker  . Smokeless tobacco: Never Used  Vaping Use  . Vaping Use: Never used  Substance and Sexual Activity  . Alcohol use: No    Alcohol/week: 0.0 standard drinks  . Drug use: No  . Sexual activity: Yes    Birth control/protection: Surgical  Other Topics Concern  . Not on file  Social History Narrative  . Not on file   Social Determinants of Health   Financial Resource Strain:   . Difficulty of Paying Living Expenses:   Food Insecurity:   . Worried About Programme researcher, broadcasting/film/video in the Last Year:   . Barista in the Last Year:   Transportation Needs:   . Freight forwarder (Medical):   Marland Kitchen  Lack of Transportation (Non-Medical):   Physical Activity:   . Days of Exercise per Week:   . Minutes of Exercise per Session:   Stress:   . Feeling of Stress :   Social Connections:   . Frequency of Communication with Friends and Family:   . Frequency of Social Gatherings with Friends and Family:   . Attends Religious Services:   . Active Member of Clubs or Organizations:   . Attends Banker Meetings:   Marland Kitchen Marital Status:   Intimate Partner Violence:   . Fear of Current or Ex-Partner:   . Emotionally Abused:   Marland Kitchen Physically Abused:   . Sexually Abused:     Allergies:  Allergies  Allergen Reactions  . Sulfamethoxazole-Trimethoprim     rash    Medications: Prior to Admission medications   Medication Sig Start Date End Date Taking? Authorizing Provider   amLODipine (NORVASC) 5 MG tablet TAKE 1 TABLET BY MOUTH EVERY DAY 05/29/19   Margaretann Loveless, PA-C  atorvastatin (LIPITOR) 10 MG tablet TAKE 1 TABLET BY MOUTH EVERY DAY 10/07/19   Margaretann Loveless, PA-C  doxycycline (VIBRA-TABS) 100 MG tablet Take 1 tablet (100 mg total) by mouth 2 (two) times daily. 01/01/20   Margaretann Loveless, PA-C  fluconazole (DIFLUCAN) 150 MG tablet Take 1 tablet (150 mg total) by mouth daily. 12/22/19   Margaretann Loveless, PA-C  glipiZIDE (GLUCOTROL XL) 10 MG 24 hr tablet Take 2 tablets (20 mg total) by mouth daily with breakfast. 10/13/19 01/11/20  Margaretann Loveless, PA-C  hydrochlorothiazide (MICROZIDE) 12.5 MG capsule TAKE 1 CAPSULE BY MOUTH EVERY DAY 09/08/19   Margaretann Loveless, PA-C  ketoconazole (NIZORAL) 2 % cream Apply 1 application topically daily. 11/03/19   Margaretann Loveless, PA-C  levothyroxine (SYNTHROID) 25 MCG tablet Take 1 tablet (25 mcg total) by mouth daily. 12/23/19   Margaretann Loveless, PA-C  meclizine (ANTIVERT) 25 MG tablet Take 1 tablet (25 mg total) by mouth 3 (three) times daily as needed for dizziness. 01/03/19   Margaretann Loveless, PA-C  mupirocin nasal ointment (BACTROBAN) 2 % Place 1 application into the nose 2 (two) times daily. Use one-half of tube in each nostril twice daily for five (5) days. After application, press sides of nose together and gently massage. 12/25/19   Margaretann Loveless, PA-C  mupirocin ointment (BACTROBAN) 2 % Apply 1 application topically 2 (two) times daily. 04/30/19   Jannifer Rodney A, FNP  nystatin (MYCOSTATIN/NYSTOP) powder Apply 1 application topically 3 (three) times daily. 12/22/19   Margaretann Loveless, PA-C  Omega-3 Fatty Acids (FISH OIL) 1200 MG CAPS Take 1 capsule by mouth daily.    [provider]  omeprazole (PRILOSEC) 20 MG capsule Take 1 capsule (20 mg total) by mouth 2 (two) times daily before a meal. 12/22/19 03/21/20  Burnette, Alessandra Bevels, PA-C  Sennosides-Docusate Sodium  (SENNA-DOCUSATE SODIUM PO) Take by mouth as needed.     [provider]  hydrochlorothiazide (MICROZIDE) 12.5 MG capsule Take 1 capsule (12.5 mg total) by mouth daily. 10/11/18   Margaretann Loveless, PA-C  omeprazole (PRILOSEC) 20 MG capsule Take 1 capsule (20 mg total) by mouth 2 (two) times daily before a meal. 10/11/18   Burnette, Alessandra Bevels, PA-C    Physical Exam Vitals: Blood pressure 128/82, height 5\' 6"  (1.676 m), weight 236 lb (107 kg).  General: NAD HEENT: normocephalic, anicteric Neck: no thyroid enlargement, no palpable nodules, no cervical lymphadenopathy  Pulmonary: No increased  work of breathing, CTAB Cardiovascular: RRR, without murmur  Breast: Breast symmetrical, no tenderness, no palpable nodules or masses, no skin or nipple retraction present, no nipple discharge.  No axillary, infraclavicular or supraclavicular lymphadenopathy. Abdomen: Soft, non-tender, non-distended.  Umbilicus without lesions.  No hepatomegaly or masses palpable. No evidence of hernia. Genitourinary:  External: Normal external female genitalia.  Normal urethral meatus, normal  Bartholin's and Skene's glands.  She has two small red acne like bumps on her derriere.  Vagina: Normal vaginal mucosa, no evidence of prolapse.    Cervix: Grossly normal in appearance, no bleeding, non-tender  Uterus: Anteverted, normal size, shape, and consistency, mobile, and non-tender  Adnexa: No adnexal masses, non-tender  Rectal: deferred  Lymphatic: no evidence of inguinal lymphadenopathy Extremities: no edema, erythema, or tenderness Neurologic: Grossly intact Psychiatric: mood appropriate, affect full     Assessment: 49 y.o. [id number] No problem-specific Assessment & Plan notes found for this encounter.   Plan:  We discussed her health goals which include weight loss, and considered that significant loss would help her diabetic status as well as her hyperlipidemia possibly. Encouraged her to explore  assisted wt loss.  1) Breast cancer screening - recommend monthly self breast exam. she has an appt for a mammo  2) STI screening was offered and declined.  3) Cervical cancer screening - Pap was done. ASCCP guidelines and rational discussed.  Patient opts for every 5 years screening interval  4) Contraception - Education given regarding options for contraception  5) Routine healthcare maintenance including cholesterol and diabetes screening managed by PCP   RTC in one year or PRN.  She will treat thye small bumps with a toner or rubbing alcohol to dry them. Mirna Mires, CNM  01/23/2020 9:20 AM

## 2020-01-23 NOTE — Telephone Encounter (Signed)
Copied from CRM [id number]. Topic: General - Other >> Jan 23, 2020  1:49 PM Jaquita Rector A wrote: Reason for CRM: Patient called to speak to Joycelyn Man nurse. States that she have follow up questions regarding the last time she was there to be seen. Can be reached at Ph# [phone]

## 2020-01-23 NOTE — Telephone Encounter (Signed)
Per patient everything is fixed and that the pharmacy already called her

## 2020-01-27 LAB — CYTOLOGY - PAP
Comment: NEGATIVE
Diagnosis: NEGATIVE
High risk HPV: NEGATIVE

## 2020-01-28 ENCOUNTER — Ambulatory Visit: Payer: BC Managed Care – PPO | Admitting: Certified Nurse Midwife

## 2020-01-28 ENCOUNTER — Encounter: Payer: Self-pay | Admitting: Obstetrics

## 2020-02-03 ENCOUNTER — Ambulatory Visit
Admission: RE | Admit: 2020-02-03 | Discharge: 2020-02-03 | Disposition: A | Discharge status: Home / Self Care | Payer: BC Managed Care – PPO | Source: Ambulatory Visit | Attending: Certified Nurse Midwife | Admitting: Certified Nurse Midwife

## 2020-02-03 DIAGNOSIS — Z1231 Encounter for screening mammogram for malignant neoplasm of breast: Secondary | ICD-10-CM

## 2020-02-08 ENCOUNTER — Other Ambulatory Visit: Payer: Self-pay | Admitting: Physician Assistant

## 2020-02-08 DIAGNOSIS — I10 Essential (primary) hypertension: Secondary | ICD-10-CM

## 2020-02-08 NOTE — Telephone Encounter (Signed)
Requested Prescriptions  Pending Prescriptions Disp Refills  . amLODipine (NORVASC) 5 MG tablet [Pharmacy Med Name: AMLODIPINE BESYLATE 5 MG TAB] 90 tablet 1    Sig: TAKE 1 TABLET BY MOUTH EVERY DAY     Cardiovascular:  Calcium Channel Blockers Passed - 02/08/2020  9:24 AM      Passed - Last BP in normal range    BP Readings from Last 1 Encounters:  01/23/20 128/82         Passed - Valid encounter within last 6 months    Recent Outpatient Visits          1 month ago Type 2 diabetes mellitus with hyperglycemia, without long-term current use of insulin [facility])   [facility] [place], [place], [place]   1 year ago Type 2 diabetes mellitus without complication, without long-term current use of insulin [facility])   [facility] [place], [place], [place]   1 year ago Type 2 diabetes mellitus without complication, without long-term current use of insulin [facility])   [facility] [place], Alessandra Bevels, [place]   1 year ago Essential hypertension   [facility], [place], [place]   1 year ago [facility], [place], [place]

## 2020-02-10 ENCOUNTER — Encounter: Payer: Self-pay | Admitting: Physician Assistant

## 2020-02-10 DIAGNOSIS — L0292 Furuncle, unspecified: Secondary | ICD-10-CM

## 2020-02-11 MED ORDER — DOXYCYCLINE HYCLATE 100 MG PO TABS: 100.0000 mg | ORAL_TABLET | Freq: Two times a day (BID) | ORAL | 0 refills | Status: DC

## 2020-02-24 ENCOUNTER — Encounter: Payer: Self-pay | Admitting: Physician Assistant

## 2020-02-24 DIAGNOSIS — E1165 Type 2 diabetes mellitus with hyperglycemia: Secondary | ICD-10-CM

## 2020-02-24 DIAGNOSIS — E039 Hypothyroidism, unspecified: Secondary | ICD-10-CM

## 2020-02-24 DIAGNOSIS — E7800 Pure hypercholesterolemia, unspecified: Secondary | ICD-10-CM

## 2020-02-24 NOTE — Addendum Note (Signed)
Addended by: Marjie Skiff on: 02/24/2020 11:22 AM   Modules accepted: Orders

## 2020-02-25 ENCOUNTER — Other Ambulatory Visit: Payer: Self-pay | Admitting: Physician Assistant

## 2020-02-26 LAB — LIPID PANEL
Chol/HDL Ratio: 4.1 ratio (ref 0.0–4.4)
Cholesterol, Total: 172 mg/dL (ref 100–199)
HDL: 42 mg/dL (ref >39)
LDL Chol Calc (NIH): 108 mg/dL — ABNORMAL HIGH (ref 0–99)
Triglycerides: 122 mg/dL (ref 0–149)
VLDL Cholesterol Cal: 22 mg/dL (ref 5–40)

## 2020-02-26 LAB — HEMOGLOBIN A1C
Est. average glucose Bld gHb Est-mCnc: 126 mg/dL
Hgb A1c MFr Bld: 6 % — ABNORMAL HIGH (ref 4.8–5.6)

## 2020-02-26 LAB — TSH+FREE T4
Free T4: 1.07 ng/dL (ref 0.82–1.77)
TSH: 2.7 u[IU]/mL (ref 0.450–4.500)

## 2020-03-15 ENCOUNTER — Other Ambulatory Visit: Payer: Self-pay | Admitting: Physician Assistant

## 2020-03-15 DIAGNOSIS — E039 Hypothyroidism, unspecified: Secondary | ICD-10-CM

## 2020-04-03 ENCOUNTER — Other Ambulatory Visit: Payer: Self-pay | Admitting: Physician Assistant

## 2020-04-03 DIAGNOSIS — R030 Elevated blood-pressure reading, without diagnosis of hypertension: Secondary | ICD-10-CM

## 2020-04-03 DIAGNOSIS — E7800 Pure hypercholesterolemia, unspecified: Secondary | ICD-10-CM

## 2020-04-03 NOTE — Telephone Encounter (Signed)
Requested Prescriptions  Pending Prescriptions Disp Refills  . atorvastatin (LIPITOR) 10 MG tablet [Pharmacy Med Name: ATORVASTATIN 10 MG TABLET] 90 tablet 0    Sig: TAKE 1 TABLET BY MOUTH EVERY DAY     Cardiovascular:  Antilipid - Statins Failed - 04/03/2020  8:47 AM      Failed - LDL in normal range and within 360 days    LDL Cholesterol (Calc)  Date Value Ref Range Status  05/07/2017 126 (H) mg/dL (calc) Final    Comment:    Reference range: <100 . Desirable range <100 mg/dL for primary prevention;   <70 mg/dL for patients with CHD or diabetic patients  with > or = 2 CHD risk factors. Marland Kitchen LDL-C is now calculated using the Martin-Hopkins  calculation, which is a validated novel method providing  better accuracy than the Friedewald equation in the  estimation of LDL-C.  Horald Pollen et al. Lenox Ahr. 1696;789(38): 2061-2068  (http://education.[facility].com/faq/FAQ164)    LDL Chol Calc (NIH)  Date Value Ref Range Status  02/25/2020 108 (H) 0 - 99 mg/dL Final         Passed - Total Cholesterol in normal range and within 360 days    Cholesterol, Total  Date Value Ref Range Status  02/25/2020 172 100 - 199 mg/dL Final         Passed - HDL in normal range and within 360 days    HDL  Date Value Ref Range Status  02/25/2020 42 >39 mg/dL Final         Passed - Triglycerides in normal range and within 360 days    Triglycerides  Date Value Ref Range Status  02/25/2020 122 0 - 149 mg/dL Final         Passed - Patient is not pregnant      Passed - Valid encounter within last 12 months    Recent Outpatient Visits          3 months ago Type 2 diabetes mellitus with hyperglycemia, without long-term current use of insulin [facility])   [facility] [place], [place], PA-C   1 year ago Type 2 diabetes mellitus without complication, without long-term current use of insulin [facility])   [facility] [place], [place], [place]   1 year ago Type 2 diabetes  mellitus without complication, without long-term current use of insulin [facility])   [facility] [place], [place], [place]   1 year ago Essential hypertension   [facility] [place], [place], [place]   1 year ago [facility], [place]ino Dike M, PA-C             . hydrochlorothiazide (MICROZIDE) 12.5 MG capsule [Pharmacy Med Name: HYDROCHLOROTHIAZIDE 12.5 MG CP] 90 capsule 0    Sig: TAKE 1 CAPSULE BY MOUTH EVERY DAY     Cardiovascular: Diuretics - Thiazide Passed - 04/03/2020  8:47 AM      Passed - Ca in normal range and within 360 days    Calcium  Date Value Ref Range Status  12/22/2019 9.8 8.7 - 10.2 mg/dL Final         Passed - Cr in normal range and within 360 days    Creat  Date Value Ref Range Status  05/07/2017 0.77 0.50 - 1.10 mg/dL Final   Creatinine, Ser  Date Value Ref Range Status  12/22/2019 0.67 0.57 - 1.00 mg/dL Final         Passed - K in normal range and  within 360 days    Potassium  Date Value Ref Range Status  12/22/2019 3.8 3.5 - 5.2 mmol/L Final         Passed - Na in normal range and within 360 days    Sodium  Date Value Ref Range Status  12/22/2019 140 134 - 144 mmol/L Final         Passed - Last BP in normal range    BP Readings from Last 1 Encounters:  01/23/20 128/82         Passed - Valid encounter within last 6 months    Recent Outpatient Visits          3 months ago Type 2 diabetes mellitus with hyperglycemia, without long-term current use of insulin [facility])   [facility] [place], [place], [place]   1 year ago Type 2 diabetes mellitus without complication, without long-term current use of insulin [facility])   [facility] [place], [place], [place]   1 year ago Type 2 diabetes mellitus without complication, without long-term current use of insulin [facility])   [facility] [place], [place]andra Bevel[place], [place]   1 year ago Essential hypertension    [facility] [place], [place] Bevel[place], [place]   1 year ago [facility], [place], [place]

## 2020-04-19 ENCOUNTER — Encounter: Payer: Self-pay | Admitting: Physician Assistant

## 2020-04-21 ENCOUNTER — Telehealth (INDEPENDENT_AMBULATORY_CARE_PROVIDER_SITE_OTHER): Payer: BC Managed Care – PPO | Admitting: Physician Assistant

## 2020-04-21 ENCOUNTER — Encounter: Payer: Self-pay | Admitting: Physician Assistant

## 2020-04-21 DIAGNOSIS — R0789 Other chest pain: Secondary | ICD-10-CM | POA: Diagnosis not present

## 2020-04-21 DIAGNOSIS — R079 Chest pain, unspecified: Secondary | ICD-10-CM

## 2020-04-21 DIAGNOSIS — Z8249 Family history of ischemic heart disease and other diseases of the circulatory system: Secondary | ICD-10-CM

## 2020-04-21 DIAGNOSIS — G5603 Carpal tunnel syndrome, bilateral upper limbs: Secondary | ICD-10-CM

## 2020-04-21 NOTE — Progress Notes (Signed)
Virtual telephone visit    Virtual Visit via Telephone Note   This visit type was conducted due to national recommendations for restrictions regarding the COVID-19 Pandemic (e.g. social distancing) in an effort to limit this patient's exposure and mitigate transmission in our community. Due to her co-morbid illnesses, this patient is at least at moderate risk for complications without adequate follow up. This format is felt to be most appropriate for this patient at this time. The patient did not have access to video technology or had technical difficulties with video requiring transitioning to audio format only (telephone). Physical exam was limited to content and character of the telephone converstion.    Patient location: Home Provider location: [facility]  I discussed the limitations of evaluation and management by telemedicine and the availability of in person appointments. The patient expressed understanding and agreed to proceed.   Visit Date: 04/21/2020  Today's healthcare provider: Margaretann Loveless, PA-C   No chief complaint on file.  Subjective    HPI   Patient having chest tightness and chest pain in the middle of her chest. It is happening more often than previously mentioned. Does have associated shortness of breath, not every time. Some mild DOE. Mother's brother passed from MI in his 66s, maternal GM had open heart surgery in her 35s. Paternal GF had multiple MI, cause of death was MI. Passed early, before patient was born. Patient also having increased fatigue. Does have spells where she is extremely hot and this occurs with SOB.  Denies leg swelling.  Denies nausea, vomiting.     Patient Active Problem List   Diagnosis Date Noted  . Calcaneal spur 06/12/2018  . Plantar fasciitis, bilateral 06/12/2018  . Hypothyroidism 12/06/2017  . Class 2 severe obesity due to excess calories with serious comorbidity and body mass index (BMI) of 36.0 to 36.9 in adult [facility])  11/10/2016  . BPPV (benign paroxysmal positional vertigo) 09/28/2015  . Diabetes mellitus (HCC) 06/02/2015  . Hypercholesterolemia 06/02/2015  . Essential hypertension 05/05/2015   Past Medical History:  Diagnosis Date  . Anxiety   . Diabetes mellitus, type II (HCC)   . Dysmenorrhea   . Hypertension   . Hypothyroidism   . Menorrhagia   . Morbid obesity (HCC)   . Vertigo       Medications: Outpatient Medications Prior to Visit  Medication Sig  . amLODipine (NORVASC) 5 MG tablet TAKE 1 TABLET BY MOUTH EVERY DAY  . atorvastatin (LIPITOR) 10 MG tablet TAKE 1 TABLET BY MOUTH EVERY DAY  . doxycycline (VIBRA-TABS) 100 MG tablet Take 1 tablet (100 mg total) by mouth 2 (two) times daily.  . fluconazole (DIFLUCAN) 150 MG tablet Take 1 tablet (150 mg total) by mouth daily.  Marland Kitchen glipiZIDE (GLUCOTROL XL) 10 MG 24 hr tablet Take 2 tablets (20 mg total) by mouth daily with breakfast.  . hydrochlorothiazide (MICROZIDE) 12.5 MG capsule TAKE 1 CAPSULE BY MOUTH EVERY DAY  . ketoconazole (NIZORAL) 2 % cream Apply 1 application topically daily.  Marland Kitchen levothyroxine (SYNTHROID) 25 MCG tablet TAKE 1 TABLET BY MOUTH EVERY DAY  . meclizine (ANTIVERT) 25 MG tablet Take 1 tablet (25 mg total) by mouth 3 (three) times daily as needed for dizziness.  . mupirocin nasal ointment (BACTROBAN) 2 % Place 1 application into the nose 2 (two) times daily. Use one-half of tube in each nostril twice daily for five (5) days. After application, press sides of nose together and gently massage.  . mupirocin ointment (BACTROBAN)  2 % Apply 1 application topically 2 (two) times daily.  Marland Kitchen nystatin (MYCOSTATIN/NYSTOP) powder Apply 1 application topically 3 (three) times daily.  . Omega-3 Fatty Acids (FISH OIL) 1200 MG CAPS Take 1 capsule by mouth daily.  Marland Kitchen omeprazole (PRILOSEC) 20 MG capsule Take 1 capsule (20 mg total) by mouth 2 (two) times daily before a meal.  . Sennosides-Docusate Sodium (SENNA-DOCUSATE SODIUM PO) Take by mouth  as needed.    No facility-administered medications prior to visit.    Review of Systems  Constitutional: Negative.   Respiratory: Positive for chest tightness and shortness of breath.   Cardiovascular: Positive for chest pain and leg swelling. Negative for palpitations.  Gastrointestinal: Negative.   Neurological: Positive for headaches. Negative for dizziness, weakness and numbness.    Last CBC Lab Results  Component Value Date   WBC 11.2 (H) 12/22/2019   HGB 14.1 12/22/2019   HCT 41.8 12/22/2019   MCV 86 12/22/2019   MCH 28.9 12/22/2019   RDW 13.8 12/22/2019   PLT 355 12/22/2019   Last metabolic panel Lab Results  Component Value Date   GLUCOSE 114 (H) 12/22/2019   NA 140 12/22/2019   K 3.8 12/22/2019   CL 100 12/22/2019   CO2 24 12/22/2019   BUN 12 12/22/2019   CREATININE 0.67 12/22/2019   GFRNONAA 104 12/22/2019   GFRAA 119 12/22/2019   CALCIUM 9.8 12/22/2019   PROT 7.0 05/20/2019   ALBUMIN 4.2 05/20/2019   LABGLOB 2.8 05/20/2019   AGRATIO 1.5 05/20/2019   BILITOT 0.3 05/20/2019   ALKPHOS 87 05/20/2019   AST 18 05/20/2019   ALT 16 05/20/2019      Objective    There were no vitals taken for this visit. BP Readings from Last 3 Encounters:  01/23/20 128/82  12/22/19 140/90  01/14/19 (!) 141/86   Wt Readings from Last 3 Encounters:  01/23/20 236 lb (107 kg)  12/22/19 231 lb 12.8 oz (105.1 kg)  01/14/19 225 lb 6.4 oz (102.2 kg)        Assessment & Plan     1. Chest tightness Strong family history of cardiovascular disease. Patient having SOB and chest pain intermittently. Suspect anxiety component, but with family history and co-morbidities referral to cardiology is reasonable for evaluation. Consult appreciated.  - Ambulatory referral to Cardiology  2. Chest pain, unspecified type See above medical treatment plan. - Ambulatory referral to Cardiology  3. Family history of cardiovascular disease See above medical treatment plan. - Ambulatory  referral to Cardiology  4. Bilateral carpal tunnel syndrome Mentions having bilateral hand numbness occurring mostly overnight and can awaken her. Suspect carpal tunnel. Discussed wearing cock-up wrist splints bilaterally overnight. Can also use ibuprofen or aleve for inflammation. Call if worsening and orthopedic referral desired.    No follow-ups on file.    I discussed the assessment and treatment plan with the patient. The patient was provided an opportunity to ask questions and all were answered. The patient agreed with the plan and demonstrated an understanding of the instructions.   The patient was advised to call back or seek an in-person evaluation if the symptoms worsen or if the condition fails to improve as anticipated.  I provided 13 minutes of non-face-to-face time during this encounter.  Delmer Islam, PA-C, have reviewed all documentation for this visit. The documentation on 04/25/20 for the exam, diagnosis, procedures, and orders are all accurate and complete.  Reine Just [facility] [phone] (phone) [phone] (fax)  [place]

## 2020-04-21 NOTE — Telephone Encounter (Signed)
Called patient and send a my chart visit no reply yet

## 2020-04-21 NOTE — Telephone Encounter (Signed)
Can we call and offer her a virtual or in-person appt for "heart problems, wants referral" please?

## 2020-04-25 ENCOUNTER — Encounter: Payer: Self-pay | Admitting: Physician Assistant

## 2020-04-25 NOTE — Patient Instructions (Signed)
Carpal Tunnel Syndrome  Carpal tunnel syndrome is a condition that causes pain in your hand and arm. The carpal tunnel is a narrow area that is on the palm side of your wrist. Repeated wrist motion or certain diseases may cause swelling in the tunnel. This swelling can pinch the main nerve in the wrist (median nerve). What are the causes? This condition may be caused by:  Repeated wrist motions.  Wrist injuries.  Arthritis.  A sac of fluid (cyst) or abnormal growth (tumor) in the carpal tunnel.  Fluid buildup during pregnancy. Sometimes the cause is not known. What increases the risk? The following factors may make you more likely to develop this condition:  Having a job in which you move your wrist in the same way many times. This includes jobs like being a butcher or a cashier.  Being a woman.  Having other health conditions, such as: ? Diabetes. ? Obesity. ? A thyroid gland that is not active enough (hypothyroidism). ? Kidney failure. What are the signs or symptoms? Symptoms of this condition include:  A tingling feeling in your fingers.  Tingling or a loss of feeling (numbness) in your hand.  Pain in your entire arm. This pain may get worse when you bend your wrist and elbow for a long time.  Pain in your wrist that goes up your arm to your shoulder.  Pain that goes down into your palm or fingers.  A weak feeling in your hands. You may find it hard to grab and hold items. You may feel worse at night. How is this diagnosed? This condition is diagnosed with a medical history and physical exam. You may also have tests, such as:  Electromyogram (EMG). This test checks the signals that the nerves send to the muscles.  Nerve conduction study. This test checks how well signals pass through your nerves.  Imaging tests, such as X-rays, ultrasound, and MRI. These tests check for what might be the cause of your condition. How is this treated? This condition may be treated  with:  Lifestyle changes. You will be asked to stop or change the activity that caused your problem.  Doing exercise and activities that make bones and muscles stronger (physical therapy).  Learning how to use your hand again (occupational therapy).  Medicines for pain and swelling (inflammation). You may have injections in your wrist.  A wrist splint.  Surgery. Follow these instructions at home: If you have a splint:  Wear the splint as told by your doctor. Remove it only as told by your doctor.  Loosen the splint if your fingers: ? Tingle. ? Lose feeling (become numb). ? Turn cold and blue.  Keep the splint clean.  If the splint is not waterproof: ? Do not let it get wet. ? Cover it with a watertight covering when you take a bath or a shower. Managing pain, stiffness, and swelling   If told, put ice on the painful area: ? If you have a removable splint, remove it as told by your doctor. ? Put ice in a plastic bag. ? Place a towel between your skin and the bag. ? Leave the ice on for 20 minutes, 2-3 times per day. General instructions  Take over-the-counter and prescription medicines only as told by your doctor.  Rest your wrist from any activity that may cause pain. If needed, talk with your boss at work about changes that can help your wrist heal.  Do any exercises as told by your doctor,   physical therapist, or occupational therapist.  Keep all follow-up visits as told by your doctor. This is important. Contact a doctor if:  You have new symptoms.  Medicine does not help your pain.  Your symptoms get worse. Get help right away if:  You have very bad numbness or tingling in your wrist or hand. Summary  Carpal tunnel syndrome is a condition that causes pain in your hand and arm.  It is often caused by repeated wrist motions.  Lifestyle changes and medicines are used to treat this problem. Surgery may help in very bad cases.  Follow your doctor's  instructions about wearing a splint, resting your wrist, keeping follow-up visits, and calling for help. This information is not intended to replace advice given to you by your health care provider. Make sure you discuss any questions you have with your health care provider. Document Revised: 11/09/2017 Document Reviewed: 11/09/2017 Elsevier Patient Education  2020 Elsevier Inc.  

## 2020-06-08 ENCOUNTER — Other Ambulatory Visit: Payer: Self-pay | Admitting: Physician Assistant

## 2020-06-08 DIAGNOSIS — E039 Hypothyroidism, unspecified: Secondary | ICD-10-CM

## 2020-06-27 ENCOUNTER — Other Ambulatory Visit: Payer: Self-pay | Admitting: Physician Assistant

## 2020-06-27 ENCOUNTER — Encounter: Payer: Self-pay | Admitting: Physician Assistant

## 2020-06-27 DIAGNOSIS — R030 Elevated blood-pressure reading, without diagnosis of hypertension: Secondary | ICD-10-CM

## 2020-06-27 DIAGNOSIS — E7800 Pure hypercholesterolemia, unspecified: Secondary | ICD-10-CM

## 2020-06-27 DIAGNOSIS — R42 Dizziness and giddiness: Secondary | ICD-10-CM

## 2020-06-28 ENCOUNTER — Encounter: Payer: Self-pay | Admitting: Physician Assistant

## 2020-06-28 ENCOUNTER — Other Ambulatory Visit: Payer: Self-pay | Admitting: Physician Assistant

## 2020-06-28 DIAGNOSIS — L304 Erythema intertrigo: Secondary | ICD-10-CM

## 2020-06-28 DIAGNOSIS — I10 Essential (primary) hypertension: Secondary | ICD-10-CM

## 2020-06-28 DIAGNOSIS — H66001 Acute suppurative otitis media without spontaneous rupture of ear drum, right ear: Secondary | ICD-10-CM

## 2020-06-28 MED ORDER — HYDROCHLOROTHIAZIDE 12.5 MG PO CAPS: 12.5000 mg | ORAL_CAPSULE | Freq: Every day | ORAL | 1 refills | Status: DC

## 2020-06-28 MED ORDER — ATORVASTATIN CALCIUM 10 MG PO TABS: 10.0000 mg | ORAL_TABLET | Freq: Every day | ORAL | 1 refills | Status: DC

## 2020-06-28 MED ORDER — MECLIZINE HCL 25 MG PO TABS: 25.0000 mg | ORAL_TABLET | Freq: Three times a day (TID) | ORAL | 0 refills | Status: DC | PRN

## 2020-06-28 MED ORDER — DOXYCYCLINE HYCLATE 100 MG PO TABS: 100.0000 mg | ORAL_TABLET | Freq: Two times a day (BID) | ORAL | 0 refills | Status: DC

## 2020-06-28 MED ORDER — CIPROFLOXACIN-DEXAMETHASONE 0.3-0.1 % OT SUSP: 4.0000 [drp] | Freq: Two times a day (BID) | OTIC | 0 refills | Status: DC

## 2020-06-28 NOTE — Addendum Note (Signed)
Addended by: Margaretann Loveless on: 06/28/2020 02:39 PM   Modules accepted: Orders

## 2020-06-28 NOTE — Telephone Encounter (Signed)
Patient was treated today for ear pain/pressure- courtesy RF #[id number] given until patient gets better.

## 2020-06-28 NOTE — Telephone Encounter (Signed)
Patient called about her prescription

## 2020-07-20 ENCOUNTER — Other Ambulatory Visit: Payer: Self-pay | Admitting: Physician Assistant

## 2020-07-20 DIAGNOSIS — I10 Essential (primary) hypertension: Secondary | ICD-10-CM

## 2020-08-02 ENCOUNTER — Encounter: Payer: Self-pay | Admitting: Physician Assistant

## 2020-08-02 ENCOUNTER — Telehealth (INDEPENDENT_AMBULATORY_CARE_PROVIDER_SITE_OTHER): Payer: BC Managed Care – PPO | Admitting: Physician Assistant

## 2020-08-02 DIAGNOSIS — R0981 Nasal congestion: Secondary | ICD-10-CM | POA: Diagnosis not present

## 2020-08-02 NOTE — Progress Notes (Signed)
MyChart Video Visit    Virtual Visit via Video Note   This visit type was conducted due to national recommendations for restrictions regarding the COVID-19 Pandemic (e.g. social distancing) in an effort to limit this patient's exposure and mitigate transmission in our community. This patient is at least at moderate risk for complications without adequate follow up. This format is felt to be most appropriate for this patient at this time. Physical exam was limited by quality of the video and audio technology used for the visit.   Patient location: Home Provider location: Home office in [place]  I discussed the limitations of evaluation and management by telemedicine and the availability of in person appointments. The patient expressed understanding and agreed to proceed.  Patient: Tammie Bush   DOB: [birth year]   49 y.o. Female  MRN: [id number] Visit Date: 08/02/2020  Today's healthcare provider: Margaretann Loveless, PA-C   No chief complaint on file.  Subjective    URI  This is a new problem. The current episode started in the past 7 days (Significant other strated with symptom on Friday and she ). There has been no fever. Associated symptoms include congestion, coughing, headaches, nausea, sneezing and swollen glands (Right side). Pertinent negatives include no abdominal pain, ear pain, rhinorrhea, sinus pain, sore throat, vomiting or wheezing. Associated symptoms comments: Chest tightness. She has tried acetaminophen for the symptoms. The treatment provided no relief.    Symptoms started yesterday, 08/01/20. Voice is raspy, scratchy throat, chest tightness, head congestion. Dry cough is present. No SOB or difficulty breathing.   Patient Active Problem List   Diagnosis Date Noted  . Calcaneal spur 06/12/2018  . Plantar fasciitis, bilateral 06/12/2018  . Hypothyroidism 12/06/2017  . Class 2 severe obesity due to excess calories with serious comorbidity and body mass index  (BMI) of 36.0 to 36.9 in adult [facility]) 11/10/2016  . BPPV (benign paroxysmal positional vertigo) 09/28/2015  . Diabetes mellitus (HCC) 06/02/2015  . Hypercholesterolemia 06/02/2015  . Essential hypertension 05/05/2015   Past Medical History:  Diagnosis Date  . Anxiety   . Diabetes mellitus, type II (HCC)   . Dysmenorrhea   . Hypertension   . Hypothyroidism   . Menorrhagia   . Morbid obesity (HCC)   . Vertigo       Medications: Outpatient Medications Prior to Visit  Medication Sig  . amLODipine (NORVASC) 5 MG tablet TAKE 1 TABLET BY MOUTH EVERY DAY  . atorvastatin (LIPITOR) 10 MG tablet Take 1 tablet (10 mg total) by mouth daily.  . fluconazole (DIFLUCAN) 150 MG tablet Take 1 tablet (150 mg total) by mouth daily.  Marland Kitchen glipiZIDE (GLUCOTROL XL) 10 MG 24 hr tablet Take 2 tablets (20 mg total) by mouth daily with breakfast.  . hydrochlorothiazide (MICROZIDE) 12.5 MG capsule Take 1 capsule (12.5 mg total) by mouth daily.  Marland Kitchen levothyroxine (SYNTHROID) 25 MCG tablet TAKE 1 TABLET BY MOUTH EVERY DAY  . meclizine (ANTIVERT) 25 MG tablet Take 1 tablet (25 mg total) by mouth 3 (three) times daily as needed for dizziness.  . nystatin (MYCOSTATIN/NYSTOP) powder Apply 1 application topically 3 (three) times daily.  . Omega-3 Fatty Acids (FISH OIL) 1200 MG CAPS Take 1 capsule by mouth daily.  Marland Kitchen omeprazole (PRILOSEC) 20 MG capsule Take 1 capsule (20 mg total) by mouth 2 (two) times daily before a meal.  . Sennosides-Docusate Sodium (SENNA-DOCUSATE SODIUM PO) Take by mouth as needed.   . ciprofloxacin-dexamethasone (CIPRODEX) OTIC suspension Place 4 drops  into the right ear 2 (two) times daily.  Marland Kitchen doxycycline (VIBRA-TABS) 100 MG tablet Take 1 tablet (100 mg total) by mouth 2 (two) times daily.  Marland Kitchen ketoconazole (NIZORAL) 2 % cream APPLY TO AFFECTED AREA EVERY DAY (Patient not taking: Reported on 08/02/2020)  . mupirocin nasal ointment (BACTROBAN) 2 % Place 1 application into the nose 2 (two) times daily.  Use one-half of tube in each nostril twice daily for five (5) days. After application, press sides of nose together and gently massage. (Patient not taking: Reported on 08/02/2020)  . mupirocin ointment (BACTROBAN) 2 % Apply 1 application topically 2 (two) times daily. (Patient not taking: Reported on 08/02/2020)   No facility-administered medications prior to visit.    Review of Systems  Constitutional: Positive for chills, fatigue and fever.  HENT: Positive for congestion, postnasal drip and sneezing. Negative for ear pain, rhinorrhea, sinus pain and sore throat.   Respiratory: Positive for cough. Negative for choking, shortness of breath and wheezing.   Cardiovascular: Negative.   Gastrointestinal: Positive for nausea. Negative for abdominal pain and vomiting.  Neurological: Positive for headaches. Negative for dizziness.    Last CBC Lab Results  Component Value Date   WBC 11.2 (H) 12/22/2019   HGB 14.1 12/22/2019   HCT 41.8 12/22/2019   MCV 86 12/22/2019   MCH 28.9 12/22/2019   RDW 13.8 12/22/2019   PLT 355 12/22/2019   Last metabolic panel Lab Results  Component Value Date   GLUCOSE 114 (H) 12/22/2019   NA 140 12/22/2019   K 3.8 12/22/2019   CL 100 12/22/2019   CO2 24 12/22/2019   BUN 12 12/22/2019   CREATININE 0.67 12/22/2019   GFRNONAA 104 12/22/2019   GFRAA 119 12/22/2019   CALCIUM 9.8 12/22/2019   PROT 7.0 05/20/2019   ALBUMIN 4.2 05/20/2019   LABGLOB 2.8 05/20/2019   AGRATIO 1.5 05/20/2019   BILITOT 0.3 05/20/2019   ALKPHOS 87 05/20/2019   AST 18 05/20/2019   ALT 16 05/20/2019      Objective    There were no vitals taken for this visit. BP Readings from Last 3 Encounters:  01/23/20 128/82  12/22/19 140/90  01/14/19 (!) 141/86   Wt Readings from Last 3 Encounters:  01/23/20 236 lb (107 kg)  12/22/19 231 lb 12.8 oz (105.1 kg)  01/14/19 225 lb 6.4 oz (102.2 kg)      Physical Exam Vitals reviewed.  Constitutional:      General: She is not in  acute distress.    Appearance: Normal appearance. She is well-developed and well-nourished. She is not ill-appearing.  HENT:     Head: Normocephalic and atraumatic.  Eyes:     Extraocular Movements: EOM normal.  Pulmonary:     Effort: Pulmonary effort is normal. No respiratory distress.  Musculoskeletal:     Cervical back: Normal range of motion and neck supple.  Skin:    Capillary Refill: Capillary refill takes less than 2 seconds.  Neurological:     General: No focal deficit present.     Mental Status: She is alert. Mental status is at baseline.  Psychiatric:        Mood and Affect: Mood and affect and mood normal.        Behavior: Behavior normal.        Thought Content: Thought content normal.        Judgment: Judgment normal.       Assessment & Plan     1. Sinus congestion Patient  with sinus congestion, nasal congestion, chills, hot flashes, fatigue. Patient's SO also symptomatic. Suspect Viral URI, possibly covid 19. Will get testing as below. I will f/u pending results. Continue symptomatic management of choice OTC. Call if symptoms are worsening or failing to improve.  - COVID-19, Flu A+B and RSV   No follow-ups on file.     I discussed the assessment and treatment plan with the patient. The patient was provided an opportunity to ask questions and all were answered. The patient agreed with the plan and demonstrated an understanding of the instructions.   The patient was advised to call back or seek an in-person evaluation if the symptoms worsen or if the condition fails to improve as anticipated.  I provided 13 minutes of face-to-face time during this encounter via MyChart Video enabled encounter.  Delmer Islam, PA-C, have reviewed all documentation for this visit. The documentation on 08/02/20 for the exam, diagnosis, procedures, and orders are all accurate and complete.  Reine Just [facility] [phone]  (phone) [phone] (fax)  [facility]

## 2020-08-02 NOTE — Patient Instructions (Signed)

## 2020-08-03 ENCOUNTER — Other Ambulatory Visit: Payer: Self-pay | Admitting: Physician Assistant

## 2020-08-03 ENCOUNTER — Encounter: Payer: Self-pay | Admitting: Physician Assistant

## 2020-08-03 DIAGNOSIS — K219 Gastro-esophageal reflux disease without esophagitis: Secondary | ICD-10-CM

## 2020-08-03 DIAGNOSIS — L304 Erythema intertrigo: Secondary | ICD-10-CM

## 2020-08-04 ENCOUNTER — Other Ambulatory Visit: Payer: Self-pay | Admitting: Physician Assistant

## 2020-08-04 ENCOUNTER — Encounter: Payer: Self-pay | Admitting: Physician Assistant

## 2020-08-04 DIAGNOSIS — R42 Dizziness and giddiness: Secondary | ICD-10-CM

## 2020-08-04 MED ORDER — MECLIZINE HCL 25 MG PO TABS
25.0000 mg | ORAL_TABLET | Freq: Three times a day (TID) | ORAL | 0 refills | Status: DC | PRN
Start: 2020-08-04 — End: 2020-09-30

## 2020-08-06 ENCOUNTER — Encounter: Payer: Self-pay | Admitting: Physician Assistant

## 2020-08-06 LAB — COVID-19, FLU A+B AND RSV
Influenza A, NAA: NOT DETECTED
Influenza B, NAA: NOT DETECTED
RSV, NAA: NOT DETECTED
SARS-CoV-2, NAA: DETECTED — AB

## 2020-08-25 ENCOUNTER — Other Ambulatory Visit: Payer: Self-pay | Admitting: Physician Assistant

## 2020-08-25 DIAGNOSIS — I10 Essential (primary) hypertension: Secondary | ICD-10-CM

## 2020-09-22 ENCOUNTER — Other Ambulatory Visit: Payer: Self-pay | Admitting: Physician Assistant

## 2020-09-22 DIAGNOSIS — R030 Elevated blood-pressure reading, without diagnosis of hypertension: Secondary | ICD-10-CM

## 2020-09-22 DIAGNOSIS — E1165 Type 2 diabetes mellitus with hyperglycemia: Secondary | ICD-10-CM

## 2020-09-22 DIAGNOSIS — E039 Hypothyroidism, unspecified: Secondary | ICD-10-CM

## 2020-09-22 DIAGNOSIS — I10 Essential (primary) hypertension: Secondary | ICD-10-CM

## 2020-09-29 ENCOUNTER — Encounter: Payer: Self-pay | Admitting: Physician Assistant

## 2020-09-29 DIAGNOSIS — I10 Essential (primary) hypertension: Secondary | ICD-10-CM

## 2020-09-29 DIAGNOSIS — E7800 Pure hypercholesterolemia, unspecified: Secondary | ICD-10-CM

## 2020-09-29 DIAGNOSIS — E1165 Type 2 diabetes mellitus with hyperglycemia: Secondary | ICD-10-CM

## 2020-09-29 DIAGNOSIS — E66812 Obesity, class 2: Secondary | ICD-10-CM

## 2020-09-29 DIAGNOSIS — E034 Atrophy of thyroid (acquired): Secondary | ICD-10-CM

## 2020-09-30 ENCOUNTER — Ambulatory Visit (INDEPENDENT_AMBULATORY_CARE_PROVIDER_SITE_OTHER): Payer: BC Managed Care – PPO | Admitting: Physician Assistant

## 2020-09-30 ENCOUNTER — Ambulatory Visit: Payer: BC Managed Care – PPO | Admitting: Physician Assistant

## 2020-09-30 ENCOUNTER — Other Ambulatory Visit: Payer: Self-pay

## 2020-09-30 ENCOUNTER — Encounter: Payer: Self-pay | Admitting: Physician Assistant

## 2020-09-30 VITALS — BP 120/92 | HR 97 | Temp 98.3°F | Wt 235.0 lb

## 2020-09-30 DIAGNOSIS — E1165 Type 2 diabetes mellitus with hyperglycemia: Secondary | ICD-10-CM

## 2020-09-30 DIAGNOSIS — I10 Essential (primary) hypertension: Secondary | ICD-10-CM

## 2020-09-30 DIAGNOSIS — L304 Erythema intertrigo: Secondary | ICD-10-CM

## 2020-09-30 DIAGNOSIS — E7800 Pure hypercholesterolemia, unspecified: Secondary | ICD-10-CM

## 2020-09-30 DIAGNOSIS — Z6837 Body mass index (BMI) 37.0-37.9, adult: Secondary | ICD-10-CM

## 2020-09-30 DIAGNOSIS — K219 Gastro-esophageal reflux disease without esophagitis: Secondary | ICD-10-CM

## 2020-09-30 DIAGNOSIS — R42 Dizziness and giddiness: Secondary | ICD-10-CM

## 2020-09-30 MED ORDER — OMEPRAZOLE 20 MG PO CPDR: 20.0000 mg | DELAYED_RELEASE_CAPSULE | Freq: Two times a day (BID) | ORAL | 1 refills | Status: DC

## 2020-09-30 MED ORDER — MECLIZINE HCL 25 MG PO TABS
25.0000 mg | ORAL_TABLET | Freq: Three times a day (TID) | ORAL | 1 refills | Status: DC | PRN
Start: 2020-09-30 — End: 2021-01-03

## 2020-09-30 MED ORDER — NYSTATIN 100000 UNIT/GM EX POWD: 1.0000 "application " | Freq: Three times a day (TID) | CUTANEOUS | 1 refills | Status: DC

## 2020-09-30 MED ORDER — ATORVASTATIN CALCIUM 10 MG PO TABS: 10.0000 mg | ORAL_TABLET | Freq: Every day | ORAL | 1 refills | Status: DC

## 2020-09-30 NOTE — Progress Notes (Signed)
Established patient visit   Patient: Tammie Bush   DOB: [birth year]   49 y.o. Female  MRN: [id number] Visit Date: 09/30/2020  Today's healthcare provider: Margaretann Loveless, PA-C   Chief Complaint  Patient presents with  . Diabetes  . Hypertension  . Hyperlipidemia   Subjective    HPI  Diabetes Mellitus Type II, follow-up  Lab Results  Component Value Date   HGBA1C 6.9 (H) 09/30/2020   HGBA1C 6.0 (H) 02/25/2020   HGBA1C 6.4 (H) 12/22/2019   Last seen for diabetes 6 months ago.  Management since then includes continuing the same treatment. She reports excellent compliance with treatment. She is not having side effects.   Home blood sugar records: are not being checked at home  Episodes of hypoglycemia? No    Current insulin regiment: None Most Recent Eye Exam: UTD  --------------------------------------------------------------------------------------------------- Hypertension, follow-up  BP Readings from Last 3 Encounters:  09/30/20 (!) 120/92  01/23/20 128/82  12/22/19 140/90   Wt Readings from Last 3 Encounters:  09/30/20 235 lb (106.6 kg)  01/23/20 236 lb (107 kg)  12/22/19 231 lb 12.8 oz (105.1 kg)     She was last seen for hypertension 6 months ago.  BP at that visit was 128/82. Management since that visit includes continue current treatment plan.. She reports excellent compliance with treatment. She is not having side effects.  She is exercising. She is adherent to low salt diet.   Outside blood pressures are not being checked often..  She does not smoke.  Use of agents associated with hypertension: none.   --------------------------------------------------------------------------------------------------- Lipid/Cholesterol, follow-up  Last Lipid Panel: Lab Results  Component Value Date   CHOL 176 09/30/2020   LDLCALC 110 (H) 09/30/2020   HDL 48 09/30/2020   TRIG 99 09/30/2020    She was last seen for this 6 months ago.   Management since that visit includes continue current medical treatment plan.  She reports excellent compliance with treatment. She is not having side effects.   Symptoms: No appetite changes No foot ulcerations  No chest pain No chest pressure/discomfort  No dyspnea No orthopnea  No fatigue No lower extremity edema  No palpitations No paroxysmal nocturnal dyspnea  No nausea Yes numbness or tingling of extremity  No polydipsia No polyuria  No speech difficulty No syncope     Last metabolic panel Lab Results  Component Value Date   GLUCOSE 128 (H) 09/30/2020   NA 139 09/30/2020   K 3.7 09/30/2020   BUN 15 09/30/2020   CREATININE 0.79 09/30/2020   GFRNONAA 104 12/22/2019   GFRAA 119 12/22/2019   CALCIUM 9.3 09/30/2020   AST 17 09/30/2020   ALT 13 09/30/2020   The 10-year ASCVD risk score Denman George DC Jr., et al., 2013) is: 2.6%  ---------------------------------------------------------------------------------------------------   Patient Active Problem List   Diagnosis Date Noted  . Calcaneal spur 06/12/2018  . Plantar fasciitis, bilateral 06/12/2018  . Hypothyroidism 12/06/2017  . Class 2 severe obesity due to excess calories with serious comorbidity and body mass index (BMI) of 36.0 to 36.9 in adult [facility]) 11/10/2016  . BPPV (benign paroxysmal positional vertigo) 09/28/2015  . Diabetes mellitus (HCC) 06/02/2015  . Hypercholesterolemia 06/02/2015  . Essential hypertension 05/05/2015   Past Medical History:  Diagnosis Date  . Anxiety   . Diabetes mellitus, type II (HCC)   . Dysmenorrhea   . Hypertension   . Hypothyroidism   . Menorrhagia   . Morbid obesity (HCC)   .  Vertigo    Social History   Tobacco Use  . Smoking status: Never Smoker  . Smokeless tobacco: Never Used  Vaping Use  . Vaping Use: Never used  Substance Use Topics  . Alcohol use: No    Alcohol/week: 0.0 standard drinks  . Drug use: No   Allergies  Allergen Reactions  .  Sulfamethoxazole-Trimethoprim     rash     Medications: Outpatient Medications Prior to Visit  Medication Sig  . amLODipine (NORVASC) 5 MG tablet TAKE 1 TABLET BY MOUTH EVERY DAY  . glipiZIDE (GLUCOTROL XL) 10 MG 24 hr tablet TAKE 2 TABLETS (20 MG TOTAL) BY MOUTH DAILY WITH BREAKFAST.  . hydrochlorothiazide (MICROZIDE) 12.5 MG capsule TAKE 1 CAPSULE BY MOUTH EVERY DAY  . ketoconazole (NIZORAL) 2 % cream APPLY TOPICALLY TO AFFECTED AREA EVERY DAY  . levothyroxine (SYNTHROID) 25 MCG tablet TAKE 1 TABLET BY MOUTH EVERY DAY  . mupirocin ointment (BACTROBAN) 2 % Apply 1 application topically 2 (two) times daily.  . Omega-3 Fatty Acids (FISH OIL) 1200 MG CAPS Take 1 capsule by mouth daily.  Bernadette Hoit Sodium (SENNA-DOCUSATE SODIUM PO) Take by mouth as needed.   . [DISCONTINUED] atorvastatin (LIPITOR) 10 MG tablet Take 1 tablet (10 mg total) by mouth daily.  . [DISCONTINUED] meclizine (ANTIVERT) 25 MG tablet Take 1 tablet (25 mg total) by mouth 3 (three) times daily as needed for dizziness.  . [DISCONTINUED] nystatin (MYCOSTATIN/NYSTOP) powder Apply 1 application topically 3 (three) times daily.  . [DISCONTINUED] omeprazole (PRILOSEC) 20 MG capsule TAKE 1 CAPSULE (20 MG TOTAL) BY MOUTH 2 (TWO) TIMES DAILY BEFORE A MEAL.  . [DISCONTINUED] ciprofloxacin-dexamethasone (CIPRODEX) OTIC suspension Place 4 drops into the right ear 2 (two) times daily.  . [DISCONTINUED] doxycycline (VIBRA-TABS) 100 MG tablet Take 1 tablet (100 mg total) by mouth 2 (two) times daily.  . [DISCONTINUED] fluconazole (DIFLUCAN) 150 MG tablet Take 1 tablet (150 mg total) by mouth daily.  . [DISCONTINUED] mupirocin nasal ointment (BACTROBAN) 2 % Place 1 application into the nose 2 (two) times daily. Use one-half of tube in each nostril twice daily for five (5) days. After application, press sides of nose together and gently massage.   No facility-administered medications prior to visit.    Review of Systems   Constitutional: Positive for fatigue. Negative for activity change, appetite change, chills, diaphoresis, fever and unexpected weight change.  Respiratory: Negative.   Cardiovascular: Negative.   Gastrointestinal: Negative.   Neurological: Positive for numbness (Bilateral arms, worse in the mornings, becoming more frequent. ).       Objective    BP (!) 120/92 (BP Location: Right Arm, Patient Position: Sitting, Cuff Size: Large)   Pulse 97   Temp 98.3 F (36.8 C) (Oral)   Wt 235 lb (106.6 kg)   SpO2 99%   BMI 37.93 kg/m    Physical Exam Vitals reviewed.  Constitutional:      General: She is not in acute distress.    Appearance: Normal appearance. She is well-developed. She is obese. She is not ill-appearing or diaphoretic.  HENT:     Right Ear: Tympanic membrane, ear canal and external ear normal.     Left Ear: Tympanic membrane, ear canal and external ear normal.  Neck:     Thyroid: No thyromegaly.     Vascular: No JVD.     Trachea: No tracheal deviation.  Cardiovascular:     Rate and Rhythm: Normal rate and regular rhythm.     Pulses: Normal  pulses.     Heart sounds: Normal heart sounds. No murmur heard. No friction rub. No gallop.   Pulmonary:     Effort: Pulmonary effort is normal. No respiratory distress.     Breath sounds: Normal breath sounds. No wheezing or rales.  Musculoskeletal:     Cervical back: Normal range of motion and neck supple.  Lymphadenopathy:     Cervical: No cervical adenopathy.  Neurological:     Mental Status: She is alert.      No results found for any visits on 09/30/20.  Assessment & Plan     1. Type 2 diabetes mellitus with hyperglycemia, without long-term current use of insulin (HCC) Stable. Continue Glipizide XR 10mg  (takes 20mg ). Will check labs as below and f/u pending results.  2. Essential hypertension Stable. Continue Amlodipine 5mg , HCTZ 12.5mg .  3. Hypercholesterolemia Stable. Diagnosis pulled for medication refill.  Continue current medical treatment plan. - atorvastatin (LIPITOR) 10 MG tablet; Take 1 tablet (10 mg total) by mouth daily.  Dispense: 90 tablet; Refill: 1  4. Class 2 severe obesity due to excess calories with serious comorbidity and body mass index (BMI) of 37.0 to 37.9 in adult [facility]) Counseled patient on healthy lifestyle modifications including dieting and exercise.   5. Intertrigo Gets under breast and in groin area. Works in , environment (school [employer]). Uses Nystatin powder prn.  - nystatin (MYCOSTATIN/NYSTOP) powder; Apply 1 application topically 3 (three) times daily.  Dispense: 60 g; Refill: 1  6. Gastroesophageal reflux disease without esophagitis Stable. Diagnosis pulled for medication refill. Continue current medical treatment plan. - omeprazole (PRILOSEC) 20 MG capsule; Take 1 capsule (20 mg total) by mouth 2 (two) times daily before a meal.  Dispense: 180 capsule; Refill: 1  7. Vertigo Gets vertigo intermittently with ETD and inner ear congestion. 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  - meclizine (ANTIVERT) 25 MG tablet; Take 1 tablet (25 mg total) by mouth 3 (three) times daily as needed for dizziness.  Dispense: 30 tablet; Refill: 1   No follow-ups on file.      [employer], PA-C, have reviewed all documentation for this visit. The documentation on 10/05/20 for the exam, diagnosis, procedures, and orders are all accurate and complete.   5  [facility] [phone] (phone) [phone] (fax)  [facility]

## 2020-10-01 LAB — COMPREHENSIVE METABOLIC PANEL
ALT: 13 IU/L (ref 0–32)
AST: 17 IU/L (ref 0–40)
Albumin/Globulin Ratio: 1.6 (ref 1.2–2.2)
Albumin: 4.4 g/dL (ref 3.8–4.8)
Alkaline Phosphatase: 108 IU/L (ref 44–121)
BUN/Creatinine Ratio: 19 (ref 9–23)
BUN: 15 mg/dL (ref 6–24)
Bilirubin Total: 0.4 mg/dL (ref 0.0–1.2)
CO2: 22 mmol/L (ref 20–29)
Calcium: 9.3 mg/dL (ref 8.7–10.2)
Chloride: 98 mmol/L (ref 96–106)
Creatinine, Ser: 0.79 mg/dL (ref 0.57–1.00)
Globulin, Total: 2.8 g/dL (ref 1.5–4.5)
Glucose: 128 mg/dL — ABNORMAL HIGH (ref 65–99)
Potassium: 3.7 mmol/L (ref 3.5–5.2)
Sodium: 139 mmol/L (ref 134–144)
Total Protein: 7.2 g/dL (ref 6.0–8.5)
eGFR: 92 mL/min/{1.73_m2} (ref >59)

## 2020-10-01 LAB — LIPID PANEL WITH LDL/HDL RATIO
Cholesterol, Total: 176 mg/dL (ref 100–199)
HDL: 48 mg/dL (ref >39)
LDL Chol Calc (NIH): 110 mg/dL — ABNORMAL HIGH (ref 0–99)
LDL/HDL Ratio: 2.3 ratio (ref 0.0–3.2)
Triglycerides: 99 mg/dL (ref 0–149)
VLDL Cholesterol Cal: 18 mg/dL (ref 5–40)

## 2020-10-01 LAB — CBC WITH DIFFERENTIAL/PLATELET
Basophils Absolute: 0 10*3/uL (ref 0.0–0.2)
Basos: 1 %
EOS (ABSOLUTE): 0.2 10*3/uL (ref 0.0–0.4)
Eos: 3 %
Hematocrit: 43.9 % (ref 34.0–46.6)
Hemoglobin: 14.7 g/dL (ref 11.1–15.9)
Immature Grans (Abs): 0 10*3/uL (ref 0.0–0.1)
Immature Granulocytes: 1 %
Lymphocytes Absolute: 1.5 10*3/uL (ref 0.7–3.1)
Lymphs: 19 %
MCH: 28.2 pg (ref 26.6–33.0)
MCHC: 33.5 g/dL (ref 31.5–35.7)
MCV: 84 fL (ref 79–97)
Monocytes Absolute: 0.4 10*3/uL (ref 0.1–0.9)
Monocytes: 5 %
Neutrophils Absolute: 6 10*3/uL (ref 1.4–7.0)
Neutrophils: 71 %
Platelets: 391 10*3/uL (ref 150–450)
RBC: 5.21 x10E6/uL (ref 3.77–5.28)
RDW: 13.6 % (ref 11.7–15.4)
WBC: 8.2 10*3/uL (ref 3.4–10.8)

## 2020-10-01 LAB — HEMOGLOBIN A1C
Est. average glucose Bld gHb Est-mCnc: 151 mg/dL
Hgb A1c MFr Bld: 6.9 % — ABNORMAL HIGH (ref 4.8–5.6)

## 2020-10-01 LAB — TSH: TSH: 2.47 u[IU]/mL (ref 0.450–4.500)

## 2020-10-04 ENCOUNTER — Encounter: Payer: Self-pay | Admitting: Physician Assistant

## 2020-10-05 ENCOUNTER — Other Ambulatory Visit: Payer: Self-pay | Admitting: Physician Assistant

## 2020-10-05 ENCOUNTER — Encounter: Payer: Self-pay | Admitting: Physician Assistant

## 2020-10-05 DIAGNOSIS — E1165 Type 2 diabetes mellitus with hyperglycemia: Secondary | ICD-10-CM

## 2020-10-05 MED ORDER — DAPAGLIFLOZIN PROPANEDIOL 5 MG PO TABS: 5.0000 mg | ORAL_TABLET | Freq: Every day | ORAL | 1 refills | Status: DC

## 2020-10-05 NOTE — Telephone Encounter (Signed)
See result note.  

## 2020-10-06 ENCOUNTER — Telehealth: Payer: Self-pay

## 2020-10-06 DIAGNOSIS — E1165 Type 2 diabetes mellitus with hyperglycemia: Secondary | ICD-10-CM

## 2020-10-06 NOTE — Telephone Encounter (Signed)
Copied from CRM [id number]. Topic: General - Other >> Oct 06, 2020  2:52 PM Gwenlyn Fudge wrote: Reason for CRM: Pt called and is requesting to have lab orders placed so that she is able to have her blood work done before her next appt on 01/03/21. Please advise.

## 2020-10-07 NOTE — Telephone Encounter (Signed)
Message read to pt; verbalizes understanding.

## 2020-10-07 NOTE — Telephone Encounter (Signed)
Tried calling patient. Left message to call back. OK for PEC triage to advise.  ?

## 2020-10-07 NOTE — Telephone Encounter (Signed)
Will forward to PCP, as she is still with the office at this time. I haven't seen the patient yet.

## 2020-10-07 NOTE — Telephone Encounter (Signed)
Most common side effect is a yeast infection.  Push fluids and stay hydrated. If yeast infection occurs, call and we will send in diflucan  A1c ordered for future. Will just need to be released

## 2020-10-07 NOTE — Telephone Encounter (Signed)
I think her only lab needed was to have her A1c done at the follow up. She can have that done in office on same day

## 2020-10-07 NOTE — Telephone Encounter (Addendum)
Patient is calling back: Advised no need for pre visit labs- but patient would like to go ahead and get A1c drawn before her appointment so she can review results at her follow up appointment. Patient advised I would send her request to provider for possible order- she will call back week of her appointment to verify order has been placed. Patient states she started her new medication-Farxiga. She is concerned about some of the possible SE( dehydration, UTI)- patient is taking before she ate breakfast- she did have slight loose bowels and some slight stomach upset- but all has improved throughout the day. Patient just wants to make sure that there is not anything else she needs to watch for regarding SE.

## 2020-10-07 NOTE — Addendum Note (Signed)
Addended by: Margaretann Loveless on: 10/07/2020 04:05 PM   Modules accepted: Orders

## 2020-10-25 ENCOUNTER — Telehealth: Payer: BC Managed Care – PPO | Admitting: Physician Assistant

## 2020-10-25 ENCOUNTER — Encounter: Payer: Self-pay | Admitting: Family Medicine

## 2020-10-25 ENCOUNTER — Ambulatory Visit: Payer: Self-pay

## 2020-10-25 ENCOUNTER — Encounter: Payer: Self-pay | Admitting: Physician Assistant

## 2020-10-25 DIAGNOSIS — H00012 Hordeolum externum right lower eyelid: Secondary | ICD-10-CM | POA: Diagnosis not present

## 2020-10-25 MED ORDER — ERYTHROMYCIN 5 MG/GM OP OINT: 1.0000 "application " | TOPICAL_OINTMENT | Freq: Every day | OPHTHALMIC | 0 refills | Status: DC

## 2020-10-25 NOTE — Telephone Encounter (Signed)
Pt. States she started having swelling and pain to right eye Thursday. States she has a stye. Has tried warm moist compresses, not helping No availability in the practice. Pt. Will try e-visit through her My Chart.  Answer Assessment - Initial Assessment Questions 1. ONSET: "When did the swelling start?" (e.g., minutes, hours, days)     Thursday 2. LOCATION: "What part of the eyelids is swollen?"     Right eye swelling 3. SEVERITY: "How swollen is it?"     Moderate 4. ITCHING: "Is there any itching?" If Yes, ask: "How much?"   (Scale 1-10; mild, moderate or severe)     No 5. PAIN: "Is the swelling painful to touch?" If Yes, ask: "How painful is it?"   (Scale 1-10; mild, moderate or severe)     Yes 6. FEVER: "Do you have a fever?" If Yes, ask: "What is it, how was it measured, and when did it start?"      No 7. CAUSE: "What do you think is causing the swelling?"     Stye 8. RECURRENT SYMPTOM: "Have you had eyelid swelling before?" If Yes, ask: "When was the last time?" "What happened that time?"     No 9. OTHER SYMPTOMS: "Do you have any other symptoms?" (e.g., blurred vision, eye discharge, rash, runny nose)     Eyelid swollen 10. PREGNANCY: "Is there any chance you are pregnant?" "When was your last menstrual period?"       No  Protocols used: EYE - [facility]

## 2020-10-25 NOTE — Progress Notes (Signed)
  E-Visit for Stye   We are sorry that you are not feeling well. Here is how we plan to help!  Based on what you have shared with me it looks like you have a stye.  A stye is an inflammation of the eyelid.  It is often a red, painful lump near the edge of the eyelid that may look like a boil or a pimple.  A stye develops when an infection occurs at the base of an eyelash.   We have made appropriate suggestions for you based upon your presentation: Simple styes can be treated without medical intervention.  Most styes either resolve spontaneously or resolve with simple home treatment by applying warm compresses or heated washcloth to the stye for about 10-15 minutes three to four times a day. This causes the stye to drain and resolve.  You can use eye lubricating drops or StyeAway drops that are available OTC through your local pharmacy.   HOME CARE:   Wash your hands often!  Let the stye open on its own. Don't squeeze or open it.  Don't rub your eyes. This can irritate your eyes and let in bacteria.  If you need to touch your eyes, wash your hands first.  Don't wear eye makeup or contact lenses until the area has healed.  GET HELP RIGHT AWAY IF:   Your symptoms do not improve.  You develop blurred or loss of vision.  Your symptoms worsen (increased discharge, pain or redness).  Thank you for choosing an e-visit.  Your e-visit answers were reviewed by a board certified advanced clinical practitioner to complete your personal care plan.  Depending upon the condition, your plan could have included both over the counter or prescription medications.  Please review your pharmacy choice.  Make sure the pharmacy is open so you can pick up prescription now.  If there is a problem, you may contact your provider through [employer] and have the prescription routed to another pharmacy.    Your safety is important to [place].  If you have drug allergies check your prescription  carefully.  For the next 24 hours you can use MyChart to ask questions about today's visit, request a non-urgent call back, or ask for a work or school excuse.  You will get an email in the next two days asking about your experience.  I hope you that your e-visit has been valuable and will speed your recovery.  I provided 5 minutes of non face-to-face time during this encounter for chart review and documentation.

## 2020-10-25 NOTE — Addendum Note (Signed)
Addended by: Margaretann Loveless on: 10/25/2020 12:33 PM   Modules accepted: Orders

## 2020-12-05 ENCOUNTER — Telehealth: Payer: BC Managed Care – PPO | Admitting: Family

## 2020-12-05 DIAGNOSIS — R6889 Other general symptoms and signs: Secondary | ICD-10-CM

## 2020-12-05 MED ORDER — OSELTAMIVIR PHOSPHATE 75 MG PO CAPS: 75.0000 mg | ORAL_CAPSULE | Freq: Two times a day (BID) | ORAL | 0 refills | Status: DC

## 2020-12-05 NOTE — Progress Notes (Signed)
E visit for Flu like symptoms   We are sorry that you are not feeling well.  Here is how we plan to help! Based on what you have shared with me it looks like you may have a respiratory virus that may be influenza.   I do think you should be tested for COVID to rule out also.   Influenza or "the flu" is   an infection caused by a respiratory virus. The flu virus is highly contagious and persons who did not receive their yearly flu vaccination may "catch" the flu from close contact.  We have anti-viral medications to treat the viruses that cause this infection. They are not a "cure" and only shorten the course of the infection. These prescriptions are most effective when they are given within the first 2 days of "flu" symptoms. Antiviral medication are indicated if you have a high risk of complications from the flu. You should  also consider an antiviral medication if you are in close contact with someone who is at risk. These medications can help patients avoid complications from the flu  but have side effects that you should know. Possible side effects from Tamiflu or oseltamivir include nausea, vomiting, diarrhea, dizziness, headaches, eye redness, sleep problems or other respiratory symptoms. You should not take Tamiflu if you have an allergy to oseltamivir or any to the ingredients in Tamiflu.  Based upon your symptoms and potential risk factors I have prescribed Oseltamivir (Tamiflu).  It has been sent to your designated pharmacy.  You will take one 75 mg capsule orally twice a day for the next 5 days.  ANYONE WHO HAS FLU SYMPTOMS SHOULD: . Stay home. The flu is highly contagious and going out or to work exposes others! . Be sure to drink plenty of fluids. Water is fine as well as fruit juices, sodas and electrolyte beverages. You may want to stay away from caffeine or alcohol. If you are nauseated, try taking small sips of liquids. How do you know if you are getting enough fluid? Your urine  should be a pale yellow or almost colorless. . Get rest. . Taking a steamy shower or using a humidifier may help nasal congestion and ease sore throat pain. Using a saline nasal spray works much the same way. . Cough drops, hard candies and sore throat lozenges may ease your cough. . Line up a caregiver. Have someone check on you regularly.   GET HELP RIGHT AWAY IF: . You cannot keep down liquids or your medications. . You become short of breath . Your fell like you are going to pass out or loose consciousness. . Your symptoms persist after you have completed your treatment plan MAKE SURE YOU   Understand these instructions.  Will watch your condition.  Will get help right away if you are not doing well or get worse.  Your e-visit answers were reviewed by a board certified advanced clinical practitioner to complete your personal care plan.  Depending on the condition, your plan could have included both over the counter or prescription medications.  If there is a problem please reply  once you have received a response from your provider.  Your safety is important to [place].  If you have drug allergies check your prescription carefully.    You can use MyChart to ask questions about today's visit, request a non-urgent call back, or ask for a work or school excuse for 24 hours related to this e-Visit. If it has been greater than  24 hours you will need to follow up with your provider, or enter a new e-Visit to address those concerns.  You will get an e-mail in the next two days asking about your experience.  I hope that your e-visit has been valuable and will speed your recovery. Thank you for using e-visits.  Approximately 5 minutes was spent documenting and reviewing patient's chart.

## 2020-12-10 ENCOUNTER — Other Ambulatory Visit: Payer: Self-pay | Admitting: Obstetrics

## 2020-12-10 DIAGNOSIS — Z1231 Encounter for screening mammogram for malignant neoplasm of breast: Secondary | ICD-10-CM

## 2020-12-21 ENCOUNTER — Telehealth: Payer: Self-pay

## 2020-12-21 NOTE — Telephone Encounter (Signed)
LMTCB, [facility] Triage Nurse may give patient the following message. Dr B is our ot the office this week and next.  The A1C was ordered and will need to be done after 6/17 for insurance purposes.  Also it is not time for her to have a full panel done, this was done in March.      Copied from CRM [id number]. Topic: Appointment Scheduling - Scheduling Inquiry for Clinic >> Dec 21, 2020  2:04 PM Randol Kern wrote: Reason for CRM:   Pt is coming in on Monday (12/27/2020) and wants to have lab work. She wants to have her A1c checked along with a full panel.   Best contact: [phone]  Wants to come in the am.   Requesting a MyChart message to update her on lab request

## 2020-12-21 NOTE — Telephone Encounter (Signed)
Patient advised.

## 2020-12-27 ENCOUNTER — Other Ambulatory Visit: Payer: Self-pay | Admitting: Physician Assistant

## 2020-12-27 ENCOUNTER — Encounter: Payer: Self-pay | Admitting: Family Medicine

## 2020-12-28 LAB — CBC WITH DIFFERENTIAL/PLATELET
Basophils Absolute: 0.1 10*3/uL (ref 0.0–0.2)
Basos: 1 %
EOS (ABSOLUTE): 0.4 10*3/uL (ref 0.0–0.4)
Eos: 4 %
Hematocrit: 45.3 % (ref 34.0–46.6)
Hemoglobin: 14.5 g/dL (ref 11.1–15.9)
Immature Grans (Abs): 0 10*3/uL (ref 0.0–0.1)
Immature Granulocytes: 0 %
Lymphocytes Absolute: 3.6 10*3/uL — ABNORMAL HIGH (ref 0.7–3.1)
Lymphs: 36 %
MCH: 27.6 pg (ref 26.6–33.0)
MCHC: 32 g/dL (ref 31.5–35.7)
MCV: 86 fL (ref 79–97)
Monocytes Absolute: 0.5 10*3/uL (ref 0.1–0.9)
Monocytes: 5 %
Neutrophils Absolute: 5.5 10*3/uL (ref 1.4–7.0)
Neutrophils: 54 %
Platelets: 349 10*3/uL (ref 150–450)
RBC: 5.26 x10E6/uL (ref 3.77–5.28)
RDW: 14 % (ref 11.7–15.4)
WBC: 10.1 10*3/uL (ref 3.4–10.8)

## 2020-12-28 LAB — LIPID PANEL WITH LDL/HDL RATIO
Cholesterol, Total: 177 mg/dL (ref 100–199)
HDL: 47 mg/dL (ref >39)
LDL Chol Calc (NIH): 109 mg/dL — ABNORMAL HIGH (ref 0–99)
LDL/HDL Ratio: 2.3 ratio (ref 0.0–3.2)
Triglycerides: 117 mg/dL (ref 0–149)
VLDL Cholesterol Cal: 21 mg/dL (ref 5–40)

## 2020-12-28 LAB — COMPREHENSIVE METABOLIC PANEL
ALT: 16 IU/L (ref 0–32)
AST: 17 IU/L (ref 0–40)
Albumin/Globulin Ratio: 1.5 (ref 1.2–2.2)
Albumin: 4.3 g/dL (ref 3.8–4.8)
Alkaline Phosphatase: 90 IU/L (ref 44–121)
BUN/Creatinine Ratio: 20 (ref 9–23)
BUN: 16 mg/dL (ref 6–24)
Bilirubin Total: 0.3 mg/dL (ref 0.0–1.2)
CO2: 23 mmol/L (ref 20–29)
Calcium: 9.4 mg/dL (ref 8.7–10.2)
Chloride: 99 mmol/L (ref 96–106)
Creatinine, Ser: 0.8 mg/dL (ref 0.57–1.00)
Globulin, Total: 2.8 g/dL (ref 1.5–4.5)
Glucose: 122 mg/dL — ABNORMAL HIGH (ref 65–99)
Potassium: 4.2 mmol/L (ref 3.5–5.2)
Sodium: 139 mmol/L (ref 134–144)
Total Protein: 7.1 g/dL (ref 6.0–8.5)
eGFR: 90 mL/min/{1.73_m2} (ref >59)

## 2020-12-28 LAB — HEMOGLOBIN A1C
Est. average glucose Bld gHb Est-mCnc: 140 mg/dL
Hgb A1c MFr Bld: 6.5 % — ABNORMAL HIGH (ref 4.8–5.6)

## 2020-12-28 LAB — TSH: TSH: 3.47 u[IU]/mL (ref 0.450–4.500)

## 2021-01-03 ENCOUNTER — Other Ambulatory Visit: Payer: Self-pay

## 2021-01-03 ENCOUNTER — Ambulatory Visit: Payer: BC Managed Care – PPO | Admitting: Family Medicine

## 2021-01-03 ENCOUNTER — Other Ambulatory Visit: Payer: Self-pay | Admitting: Physician Assistant

## 2021-01-03 ENCOUNTER — Encounter: Payer: Self-pay | Admitting: Family Medicine

## 2021-01-03 VITALS — BP 125/90 | HR 95 | Temp 98.3°F | Resp 16 | Wt 226.2 lb

## 2021-01-03 DIAGNOSIS — I10 Essential (primary) hypertension: Secondary | ICD-10-CM

## 2021-01-03 DIAGNOSIS — E1169 Type 2 diabetes mellitus with other specified complication: Secondary | ICD-10-CM | POA: Diagnosis not present

## 2021-01-03 DIAGNOSIS — E034 Atrophy of thyroid (acquired): Secondary | ICD-10-CM | POA: Diagnosis not present

## 2021-01-03 DIAGNOSIS — L304 Erythema intertrigo: Secondary | ICD-10-CM

## 2021-01-03 DIAGNOSIS — E785 Hyperlipidemia, unspecified: Secondary | ICD-10-CM

## 2021-01-03 DIAGNOSIS — E1159 Type 2 diabetes mellitus with other circulatory complications: Secondary | ICD-10-CM | POA: Diagnosis not present

## 2021-01-03 DIAGNOSIS — I152 Hypertension secondary to endocrine disorders: Secondary | ICD-10-CM

## 2021-01-03 DIAGNOSIS — R5382 Chronic fatigue, unspecified: Secondary | ICD-10-CM | POA: Insufficient documentation

## 2021-01-03 DIAGNOSIS — K5904 Chronic idiopathic constipation: Secondary | ICD-10-CM | POA: Insufficient documentation

## 2021-01-03 MED ORDER — ATORVASTATIN CALCIUM 20 MG PO TABS
20.0000 mg | ORAL_TABLET | Freq: Every day | ORAL | 3 refills | Status: DC
Start: 2021-01-03 — End: 2022-01-02

## 2021-01-03 NOTE — Assessment & Plan Note (Signed)
Long standing issue Trial of miralax daily and titrate to 1 soft BM daily

## 2021-01-03 NOTE — Assessment & Plan Note (Signed)
Wed recent labs Discussed it is multifactorial Check B12 and Vit D

## 2021-01-03 NOTE — Assessment & Plan Note (Addendum)
Well controlled ?Continue current medications ?Reviewed recent metabolic panel ?F/u in 3 months  ?

## 2021-01-03 NOTE — Patient Instructions (Signed)
Fiber Content in Foods Fiber is a substance that is found in plant foods, such as fruits, vegetables, whole grains, nuts, seeds, and beans. As part of your treatment and recovery plan, your health care provider may recommend that you eat foods that have specific amounts of dietary fiber. Some conditions may require a high-fiber diet while others may require a low-fiber diet. This sheet gives you information about the dietary fiber content of some common foods. Your health care provider will tell you how much fiber you need in your diet. If you have problems or questions, contact your health care provider or dietitian. What foods are high in fiber? Fruits Blackberries or raspberries (fresh) --  cup (75 g) has 4 g of fiber. Pear (fresh) -- 1 medium (180 g) has 5.5 g of fiber. Prunes (dried) -- 6 to 8 pieces (57-76 g) has 5 g of fiber. Apple with skin -- 1 medium (182 g) has 4.8 g of fiber. Guava -- 1 cup (128 g) has 8.9 g of fiber. Vegetables Peas (frozen) --  cup (80 g) has 4.4 g of fiber. Potato with skin (baked) -- 1 medium (173 g) has 4.4 g of fiber. Pumpkin (canned) --  cup (122 g) has 5 g of fiber. Brussels sprouts (cooked) --  cup (78 g) has 4 g of fiber. Sweet potato --  cup mashed (124 g) has 4 g of fiber. Winter squash -- 1 cup cooked (205 g) has 5.7 g of fiber. Grains Bran cereal --  cup (31 g) has 8.6 g of fiber. Bulgur (cooked) --  cup (70 g) has 4 g of fiber. Quinoa (cooked) -- 1 cup (185 g) has 5.2 g of fiber. Popcorn -- 3 cups (375 g) popped has 5.8 g of fiber. Spaghetti, whole wheat -- 1 cup (140 g) has 6 g of fiber. Meats and other proteins Pinto beans (cooked) --  cup (90 g) has 7.7 g of fiber. Lentils (cooked) --  cup (90 g) has 7.8 g of fiber. Kidney beans (canned) --  cup (92.5 g) has 5.7 g of fiber. Soybeans (canned, frozen, or fresh) --  cup (92.5 g) has 5.2 g of fiber. Baked beans, plain or vegetarian (canned) --  cup (130 g) has 5.2 g of  fiber. Garbanzo beans or chickpeas (canned) --  cup (90 g) has 6.6 g of fiber. Black beans (cooked) --  cup (86 g) has 7.5 g of fiber. White beans or navy beans (cooked) --  cup (91 g) has 9.3 g of fiber. The items listed above may not be a complete list of foods with high fiber. Actual amounts of fiber may be different depending on processing. Contact a dietitian for more information. What foods are moderate in fiber? Fruits Banana -- 1 medium (126 g) has 3.2 g of fiber. Melon -- 1 cup (155 g) has 1.4 g of fiber. Orange -- 1 small (154 g) has 3.7 g of fiber. Raisins --  cup (40 g) has 1.8 g of fiber. Applesauce, sweetened --  cup (125 g) has 1.5 g of fiber. Blueberries (fresh) --  cup (75 g) has 1.8 g of fiber. Strawberries (fresh, sliced) -- 1 cup (150 g) has 3 g of fiber. Cherries -- 1 cup (140 g) has 2.9 g of fiber. Vegetables Broccoli (cooked) --  cup (77.5 g) has 2.1 g of fiber. Carrots (cooked) --  cup (77.5 g) has 2.2 g of fiber. Corn (canned or frozen) --  cup (82.5 g) has 2.1   g of fiber. Potatoes, mashed --  cup (105 g) has 1.6 g of fiber. Tomato -- 1 medium (62 g) has 1.5 g of fiber. Green beans (canned) --  cup (83 g) has 2 g of fiber. Squash, winter --  cup (58 g) has 1 g of fiber. Sweet potato, baked -- 1 medium (150 g) has 3 g of fiber. Cauliflower (cooked) -- 1/2 cup (90 g) has 2.3 g of fiber. Grains Long-grain brown rice (cooked) -- 1 cup (196 g) has 3.5 g of fiber. Bagel, plain -- one 4-inch (10 cm) bagel has 2 g of fiber. Instant oatmeal --  cup (120 g) has about 2 g of fiber. Macaroni noodles, enriched (cooked) -- 1 cup (140 g) has 2.5 g of fiber. Multigrain cereal --  cup (15 g) has about 2-4 g of fiber. Whole-wheat bread -- 1 slice (26 g) has 2 g of fiber. Whole-wheat spaghetti noodles --  cup (70 g) has 3.2 g of fiber. Corn tortilla -- one 6-inch (15 cm) tortilla has 1.5 g of fiber. Meats and other proteins Almonds --  cup or 1 oz (28 g) has  3.5 g of fiber. Sunflower seeds in shell --  cup or  oz (11.5 g) has 1.1 g of fiber. Vegetable or soy patty -- 1 patty (70 g) has 3.4 g of fiber. Walnuts --  cup or 1 oz (30 g) has 2 g of fiber. Flax seed -- 1 Tbsp (7 g) has 2.8 g of fiber. The items listed above may not be a complete list of foods that have moderate amounts of fiber. Actual amounts of fiber may be different depending on processing. Contact a dietitian for more information. What foods are low in fiber? Low-fiber foods contain less than 1 g of fiber per serving. They include: Fruits Fruit juice --  cup or 4 fl oz (118 mL) has 0.5 g of fiber. Vegetables Lettuce -- 1 cup (35 g) has 0.5 g of fiber. Cucumber (slices) --  cup (60 g) has 0.3 g of fiber. Celery -- 1 stalk (40 g) has 0.1 g of fiber. Grains Flour tortilla -- one 6-inch (15 cm) tortilla has 0.5 g of fiber. White rice (cooked) --  cup (81.5 g) has 0.3 g of fiber. Meats and other proteins Egg -- 1 large (50 g) has 0 g of fiber. Meat, poultry, or fish -- 3 oz (85 g) has 0 g of fiber. Dairy Milk -- 1 cup or 8 fl oz (237 mL) has 0 g of fiber. Yogurt -- 1 cup (245 g) has 0 g of fiber. The items listed above may not be a complete list of foods that are low in fiber. Actual amounts of fiber may be different depending on processing. Contact a dietitian for more information. Summary Fiber is a substance that is found in plant foods, such as fruits, vegetables, whole grains, nuts, seeds, and beans. As part of your treatment and recovery plan, your health care provider may recommend that you eat foods that have specific amounts of dietary fiber. This information is not intended to replace advice given to you by your health care provider. Make sure you discuss any questions you have with your health care provider. Document Revised: 11/06/2019 Document Reviewed: 11/06/2019 Elsevier Patient Education  2022 [employer]  

## 2021-01-03 NOTE — Assessment & Plan Note (Addendum)
Previously elevated Continue statin, but increase from 10 to 20 mg daily of atorvastatin Reviewed FLP and CMP Goal LDL < 70

## 2021-01-03 NOTE — Assessment & Plan Note (Addendum)
BMI 36 and assoc with T2DM, HTN, HLD Discussed importance of healthy weight management Discussed diet and exercise

## 2021-01-03 NOTE — Progress Notes (Signed)
Established patient visit   Patient: Tammie Bush   DOB: April 20, 1971   50 y.o. Female  MRN: [id number] Visit Date: 01/03/2021  Today's healthcare provider: Shirlee LatchAngela Dezmond Downie, MD   Chief Complaint  Patient presents with   Diabetes   Hypertension   Hyperlipidemia   Abnormal Lab    Patient would like to address recent lab work and to address her current diet and weight loss to improve her overall health   Hypothyroidism   Subjective    Diabetes Pertinent negatives for hypoglycemia include no dizziness or headaches. Pertinent negatives for diabetes include no chest pain and no fatigue.  Hypertension Pertinent negatives include no chest pain, headaches or shortness of breath.  Hyperlipidemia Pertinent negatives include no chest pain or shortness of breath.  Abnormal Lab Pertinent negatives include no abdominal pain, chest pain, chills, congestion, coughing, fatigue, fever, headaches, nausea, sore throat or vomiting.  HPI     Abnormal Lab    Additional comments: Patient would like to address recent lab work and to address her current diet and weight loss to improve her overall health      Last edited by Fonda KinderWolford, Kathleen J, CMA on 01/03/2021  3:06 PM.      Diabetes Mellitus Type II, follow-up  Lab Results  Component Value Date   HGBA1C 6.5 (H) 12/27/2020   HGBA1C 6.9 (H) 09/30/2020   HGBA1C 6.0 (H) 02/25/2020   Last seen for diabetes 3 months ago.  Management since then includes continuing the same treatment. She reports excellent compliance with treatment. She is not having side effects.  Home blood sugar records:  not being checked  Episodes of hypoglycemia? No    Current insulin regiment: none Most Recent Eye Exam: 03/04/2019 [facility]  --------------------------------------------------------------------------------------------------- Hypertension, follow-up  BP Readings from Last 3 Encounters:  01/03/21 125/90  09/30/20 (!) 120/92  01/23/20 128/82    Wt Readings from Last 3 Encounters:  01/03/21 226 lb 3.2 oz (102.6 kg)  09/30/20 235 lb (106.6 kg)  01/23/20 236 lb (107 kg)     She was last seen for hypertension 3 months ago.  BP at that visit was 120/92. Management since that visit includes none. She reports excellent compliance with treatment. She is not having side effects.  She is not exercising. She is adherent to low salt diet.   Outside blood pressures are not being checked.  Use of agents associated with hypertension: none.   --------------------------------------------------------------------------------------------------- Lipid/Cholesterol, follow-up  Last Lipid Panel: Lab Results  Component Value Date   CHOL 177 12/27/2020   LDLCALC 109 (H) 12/27/2020   HDL 47 12/27/2020   TRIG 117 12/27/2020    She was last seen for this 3 months ago.  Management since that visit includes none.  She reports excellent compliance with treatment. She is not having side effects.  Symptoms: No appetite changes No foot ulcerations   chest pain  chest pressure/discomfort   dyspnea  orthopnea   fatigue  lower extremity edema   palpitations  paroxysmal nocturnal dyspnea   nausea Yes numbness or tingling of extremity  Yes polydipsia No polyuria  No speech difficulty No syncope   She is following a Regular diet. Current exercise: none  Last metabolic panel Lab Results  Component Value Date   GLUCOSE 122 (H) 12/27/2020   NA 139 12/27/2020   K 4.2 12/27/2020   BUN 16 12/27/2020   CREATININE 0.80 12/27/2020   GFRNONAA 104 12/22/2019   GFRAA 119 12/22/2019  CALCIUM 9.4 12/27/2020   AST 17 12/27/2020   ALT 16 12/27/2020   The 10-year ASCVD risk score Denman George DC Jr., et al., 2013) is: 3.2%  ---------------------------------------------------------------------------------------------------  GERD, Follow up:  The patient was last seen for GERD 3 months ago. Changes made since that visit include none.  She reports  excellent compliance with treatment. She is not having side effects..  She IS experiencing abdominal bloating, belching, and heartburn. She is NOT experiencing bilious reflux, choking on food, cough, deep pressure at base of neck, difficulty swallowing, dysphagia, early satiety, fullness after meals, or hematemesis  -----------------------------------------------------------------------------------------  Hypothyroid, follow-up  Lab Results  Component Value Date   TSH 3.470 12/27/2020   TSH 2.470 09/30/2020   TSH 2.700 02/25/2020   FREET4 1.07 02/25/2020   T4TOTAL 6.5 12/06/2017   Wt Readings from Last 3 Encounters:  01/03/21 226 lb 3.2 oz (102.6 kg)  09/30/20 235 lb (106.6 kg)  01/23/20 236 lb (107 kg)    She was last seen for hypothyroid 3 months ago.  Management since that visit includes. She reports good compliance with treatment. She is not having side effects.   Symptoms: Yes change in energy level Yes constipation  No diarrhea Yes heat / cold intolerance  No nervousness No palpitations  Yes weight changes    Constipation She states that during the past week she was experiencing constipation that caused her severe pain. She states that this is a chronic issue that has been waxing and waning for years. She states that in the past she has taken stool softener. She reports that drinking a lot of apple juice and water alleviates her symptoms.   -----------------------------------------------------------------------------------------  Patient Active Problem List   Diagnosis Date Noted   Hyperlipidemia associated with type 2 diabetes mellitus (HCC) 01/03/2021   Chronic idiopathic constipation 01/03/2021   Chronic fatigue 01/03/2021   Calcaneal spur 06/12/2018   Plantar fasciitis, bilateral 06/12/2018   Hypothyroidism 12/06/2017   Morbid obesity (HCC) 11/10/2016   BPPV (benign paroxysmal positional vertigo) 09/28/2015   Diabetes mellitus (HCC) 06/02/2015   Hypertension  associated with diabetes (HCC) 05/05/2015   Past Medical History:  Diagnosis Date   Anxiety    Diabetes mellitus, type II (HCC)    Dysmenorrhea    Hypertension    Hypothyroidism    Menorrhagia    Morbid obesity (HCC)    Vertigo    Allergies  Allergen Reactions   Sulfamethoxazole-Trimethoprim     rash       Medications: Outpatient Medications Prior to Visit  Medication Sig   amLODipine (NORVASC) 5 MG tablet TAKE 1 TABLET BY MOUTH EVERY DAY   dapagliflozin propanediol (FARXIGA) 5 MG TABS tablet Take 1 tablet (5 mg total) by mouth daily before breakfast.   glipiZIDE (GLUCOTROL XL) 10 MG 24 hr tablet TAKE 2 TABLETS (20 MG TOTAL) BY MOUTH DAILY WITH BREAKFAST.   hydrochlorothiazide (MICROZIDE) 12.5 MG capsule TAKE 1 CAPSULE BY MOUTH EVERY DAY   levothyroxine (SYNTHROID) 25 MCG tablet TAKE 1 TABLET BY MOUTH EVERY DAY   nystatin (MYCOSTATIN/NYSTOP) powder Apply 1 application topically 3 (three) times daily.   Omega-3 Fatty Acids (FISH OIL) 1200 MG CAPS Take 1 capsule by mouth daily.   Sennosides-Docusate Sodium (SENNA-DOCUSATE SODIUM PO) Take by mouth as needed.    [DISCONTINUED] atorvastatin (LIPITOR) 10 MG tablet Take 1 tablet (10 mg total) by mouth daily.   omeprazole (PRILOSEC) 20 MG capsule Take 1 capsule (20 mg total) by mouth 2 (two) times daily before  a meal.   [DISCONTINUED] erythromycin ophthalmic ointment Place 1 application into the right eye at bedtime.   [DISCONTINUED] ketoconazole (NIZORAL) 2 % cream APPLY TOPICALLY TO AFFECTED AREA EVERY DAY   [DISCONTINUED] meclizine (ANTIVERT) 25 MG tablet Take 1 tablet (25 mg total) by mouth 3 (three) times daily as needed for dizziness. (Patient not taking: Reported on 01/03/2021)   [DISCONTINUED] mupirocin ointment (BACTROBAN) 2 % Apply 1 application topically 2 (two) times daily.   [DISCONTINUED] oseltamivir (TAMIFLU) 75 MG capsule Take 1 capsule (75 mg total) by mouth 2 (two) times daily.   No facility-administered medications  prior to visit.    Review of Systems  Constitutional:  Negative for chills, fatigue and fever.  HENT:  Negative for congestion, ear pain, rhinorrhea, sinus pain and sore throat.   Respiratory:  Negative for cough, shortness of breath and wheezing.   Cardiovascular:  Negative for chest pain and leg swelling.  Gastrointestinal:  Negative for abdominal pain, blood in stool, diarrhea, nausea and vomiting.  Genitourinary:  Negative for dysuria, flank pain, frequency and urgency.  Neurological:  Negative for dizziness and headaches.   Last metabolic panel Lab Results  Component Value Date   GLUCOSE 122 (H) 12/27/2020   NA 139 12/27/2020   K 4.2 12/27/2020   CL 99 12/27/2020   CO2 23 12/27/2020   BUN 16 12/27/2020   CREATININE 0.80 12/27/2020   GFRNONAA 104 12/22/2019   GFRAA 119 12/22/2019   CALCIUM 9.4 12/27/2020   PROT 7.1 12/27/2020   ALBUMIN 4.3 12/27/2020   LABGLOB 2.8 12/27/2020   AGRATIO 1.5 12/27/2020   BILITOT 0.3 12/27/2020   ALKPHOS 90 12/27/2020   AST 17 12/27/2020   ALT 16 12/27/2020   Last lipids Lab Results  Component Value Date   CHOL 177 12/27/2020   HDL 47 12/27/2020   LDLCALC 109 (H) 12/27/2020   TRIG 117 12/27/2020   CHOLHDL 4.1 02/25/2020   Last hemoglobin A1c Lab Results  Component Value Date   HGBA1C 6.5 (H) 12/27/2020   Last thyroid functions Lab Results  Component Value Date   TSH 3.470 12/27/2020   T4TOTAL 6.5 12/06/2017   Last vitamin D Lab Results  Component Value Date   VD25OH 22.2 (L) 01/14/2019       Objective    BP 125/90   Pulse 95   Temp 98.3 F (36.8 C) (Oral)   Resp 16   Wt 226 lb 3.2 oz (102.6 kg)   SpO2 94%   BMI 36.51 kg/m     Physical Exam Vitals reviewed.  Constitutional:      General: She is not in acute distress.    Appearance: Normal appearance. She is well-developed. She is not diaphoretic.  HENT:     Head: Normocephalic and atraumatic.  Eyes:     General: No scleral icterus.     Conjunctiva/sclera: Conjunctivae normal.  Neck:     Thyroid: No thyromegaly.  Cardiovascular:     Rate and Rhythm: Normal rate and regular rhythm.     Pulses: Normal pulses.     Heart sounds: Normal heart sounds. No murmur heard. Pulmonary:     Effort: Pulmonary effort is normal. No respiratory distress.     Breath sounds: Normal breath sounds. No wheezing, rhonchi or rales.  Musculoskeletal:     Cervical back: Neck supple.     Right lower leg: No edema.     Left lower leg: No edema.  Lymphadenopathy:     Cervical: No cervical adenopathy.  Skin:    General: Skin is warm and dry.     Findings: No rash.  Neurological:     Mental Status: She is alert and oriented to person, place, and time. Mental status is at baseline.  Psychiatric:        Mood and Affect: Mood normal.        Behavior: Behavior normal.      No results found for any visits on 01/03/21.  Assessment & Plan     Problem List Items Addressed This Visit       Cardiovascular and Mediastinum   Hypertension associated with diabetes (HCC)    Well controlled Continue current medications Reviewed recent metabolic panel F/u in 3 months        Relevant Medications   atorvastatin (LIPITOR) 20 MG tablet     Digestive   Chronic idiopathic constipation    Long standing issue Trial of miralax daily and titrate to 1 soft BM daily          Endocrine   Diabetes mellitus (HCC) - Primary    Well controlled with last A1c 6.5 Assoc with HTN and HLD Continue current medications UTD on vaccines, eye exam, foot exam On Statin Discussed diet and exercise F/u in 6 months        Relevant Medications   atorvastatin (LIPITOR) 20 MG tablet   Hypothyroidism    Well controlled Continue synthroid at current dose       Hyperlipidemia associated with type 2 diabetes mellitus (HCC)    Previously elevated Continue statin, but increase from 10 to 20 mg daily of atorvastatin Reviewed FLP and CMP Goal LDL < 70         Relevant Medications   atorvastatin (LIPITOR) 20 MG tablet     Other   Morbid obesity (HCC)    BMI 36 and assoc with T2DM, HTN, HLD Discussed importance of healthy weight management Discussed diet and exercise        Chronic fatigue    Wed recent labs Discussed it is multifactorial Check B12 and Vit D       Relevant Orders   B12   VITAMIN D 25 Hydroxy (Vit-D Deficiency, Fractures)     Return in about 3 months (around 04/05/2021) for chronic disease f/u, call back in 1 month to schedule with new PCP.       Danelle Earthly Moorehead,acting as a Neurosurgeon for Shirlee Latch, MD.,have documented all relevant documentation on the behalf of Shirlee Latch, MD,as directed by  Shirlee Latch, MD while in the presence of Shirlee Latch, MD.  I, Shirlee Latch, MD, have reviewed all documentation for this visit. The documentation on 01/03/21 for the exam, diagnosis, procedures, and orders are all accurate and complete.   Leighanne Adolph, Marzella Schlein, MD, MPH [facility]

## 2021-01-03 NOTE — Assessment & Plan Note (Addendum)
Well controlled with last A1c 6.5 Assoc with HTN and HLD Continue current medications UTD on vaccines, eye exam, foot exam On Statin Discussed diet and exercise F/u in 6 months

## 2021-01-03 NOTE — Assessment & Plan Note (Signed)
Well controlled Continue synthroid at current dose

## 2021-01-05 LAB — VITAMIN B12: Vitamin B-12: 876 pg/mL (ref 232–1245)

## 2021-01-05 LAB — VITAMIN D 25 HYDROXY (VIT D DEFICIENCY, FRACTURES): Vit D, 25-Hydroxy: 36.2 ng/mL (ref 30.0–100.0)

## 2021-01-18 ENCOUNTER — Encounter: Payer: Self-pay | Admitting: Physician Assistant

## 2021-02-15 ENCOUNTER — Other Ambulatory Visit: Payer: Self-pay

## 2021-02-15 ENCOUNTER — Ambulatory Visit
Admission: RE | Admit: 2021-02-15 | Discharge: 2021-02-15 | Disposition: A | Discharge status: Home / Self Care | Payer: BC Managed Care – PPO | Source: Ambulatory Visit | Attending: Obstetrics | Admitting: Obstetrics

## 2021-02-15 DIAGNOSIS — Z1231 Encounter for screening mammogram for malignant neoplasm of breast: Secondary | ICD-10-CM | POA: Insufficient documentation

## 2021-02-17 ENCOUNTER — Ambulatory Visit (INDEPENDENT_AMBULATORY_CARE_PROVIDER_SITE_OTHER): Payer: BC Managed Care – PPO | Admitting: Obstetrics

## 2021-02-17 ENCOUNTER — Other Ambulatory Visit: Payer: Self-pay

## 2021-02-17 ENCOUNTER — Encounter: Payer: Self-pay | Admitting: Obstetrics

## 2021-02-17 VITALS — BP 120/80 | Ht 66.0 in | Wt 220.0 lb

## 2021-02-17 DIAGNOSIS — Z01419 Encounter for gynecological examination (general) (routine) without abnormal findings: Secondary | ICD-10-CM

## 2021-02-17 NOTE — Progress Notes (Signed)
Gynecology Annual Exam  PCP: Margaretann Loveless, PA-C  Chief Complaint:  Chief Complaint  Patient presents with   Annual Exam    History of Present Illness: Patient is a 50 y.o. [id number] presents for annual exam. The patient has no complaints today. 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 She shares that she "knows that she needs to lose weight".  LMP: No LMP recorded. Patient has had a hysterectomy. The patient is sexually active. She currently uses post menopausal status for contraception. She denies dyspareunia.  The patient does perform self breast exams.  There is no notable family history of breast or ovarian cancer in her family.  The patient wears seatbelts: yes.   The patient has regular exercise: no.    The patient denies current symptoms of depression.    Review of Systems: Review of Systems  Constitutional: Negative.   HENT: Negative.    Eyes: Negative.   Respiratory: Negative.    Cardiovascular: Negative.   Gastrointestinal:  Positive for constipation.       Occasional constipation  Genitourinary: Negative.   Skin: Negative.   Neurological: Negative.   Endo/Heme/Allergies: Negative.   Psychiatric/Behavioral:  The patient is nervous/anxious.        Hx of some mild anxiety.   Past Medical History:  Patient Active Problem List   Diagnosis Date Noted   Hyperlipidemia associated with type 2 diabetes mellitus (HCC) 01/03/2021   Chronic idiopathic constipation 01/03/2021   Chronic fatigue 01/03/2021   Calcaneal spur 06/12/2018   Plantar fasciitis, bilateral 06/12/2018   Hypothyroidism 12/06/2017   Morbid obesity (HCC) 11/10/2016    BMI 35.5 on 11/10/2016     BPPV (benign paroxysmal positional vertigo) 09/28/2015   Diabetes mellitus (HCC) 06/02/2015   Hypertension associated with diabetes (HCC) 05/05/2015    Past Surgical History:  Past Surgical History:  Procedure Laterality Date   ABDOMINAL HYSTERECTOMY      CHOLECYSTECTOMY  09/2017   [facility]   CRYOABLATION  08/24/2010   [facility]   HERNIA REPAIR  06/2019   LSH  04/13/2011   menorrhagia   MASTOID DEBRIDEMENT     age 66   OVARIAN CYST REMOVAL  04/13/2011   TONSILLECTOMY  1977   TUBAL LIGATION  2001   postpartum   WISDOM TOOTH EXTRACTION      Gynecologic History:  No LMP recorded. Patient has had a hysterectomy. 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  Tobacco Use   Smoking status: Never   Smokeless tobacco: Never  Vaping Use   Vaping Use: Never used  Substance and Sexual Activity   Alcohol use: No    Alcohol/week: 0.0 standard drinks   Drug use: No   Sexual activity: Yes    Birth control/protection: Surgical  Other Topics Concern   Not on  file  Social History Narrative   Not on file   Social Determinants of Health   Financial Resource Strain: Not on file  Food Insecurity: Not on file  Transportation Needs: Not on file  Physical Activity: Not on file  Stress: Not on file  Social Connections: Not on file  Intimate Partner Violence: Not on file    Allergies:  Allergies  Allergen Reactions   Sulfamethoxazole-Trimethoprim     rash    Medications: Prior to Admission medications   Medication Sig Start Date End Date Taking? Authorizing Provider  amLODipine (NORVASC) 5 MG tablet TAKE 1 TABLET BY MOUTH EVERY DAY 01/06/21  Yes  Bacigalupo, Marzella Schlein, MD  atorvastatin (LIPITOR) 20 MG tablet Take 1 tablet (20 mg total) by mouth daily. 01/03/21  Yes Bacigalupo, Marzella Schlein, MD  dapagliflozin propanediol (FARXIGA) 5 MG TABS tablet Take 1 tablet (5 mg total) by mouth daily before breakfast. 10/05/20  Yes Joycelyn Man M, PA-C  glipiZIDE (GLUCOTROL XL) 10 MG 24 hr tablet TAKE 2 TABLETS (20 MG TOTAL) BY MOUTH DAILY WITH BREAKFAST. 09/22/20 03/21/21 Yes Burnette, Jennifer M, PA-C  hydrochlorothiazide (MICROZIDE) 12.5 MG capsule TAKE 1 CAPSULE BY MOUTH EVERY DAY 09/22/20  Yes Joycelyn Man M, PA-C  levothyroxine (SYNTHROID) 25 MCG tablet TAKE 1 TABLET BY MOUTH EVERY DAY 09/22/20  Yes Joycelyn Man M, PA-C  nystatin (MYCOSTATIN/NYSTOP) powder Apply 1 application topically 3 (three) times daily. 09/30/20  Yes Margaretann Loveless, PA-C  Omega-3 Fatty Acids (FISH OIL) 1200 MG CAPS Take 1 capsule by mouth daily.   Yes [provider]  Sennosides-Docusate Sodium (SENNA-DOCUSATE SODIUM PO) Take by mouth as needed.    Yes [provider]  ketoconazole (NIZORAL) 2 % cream APPLY TOPICALLY TO AFFECTED AREA EVERY DAY 01/06/21   Erasmo Downer, MD  omeprazole (PRILOSEC) 20 MG capsule Take 1 capsule (20 mg total) by mouth 2 (two) times daily before a meal. 09/30/20 12/29/20  Burnette, Alessandra Bevels, PA-C    Physical Exam Vitals: Blood pressure 120/80, height 5\' 6"  (1.676 m), weight 220 lb (99.8 kg).  General: NAD HEENT: normocephalic, anicteric Thyroid: no enlargement, no palpable nodules Pulmonary: No increased work of breathing, CTAB Cardiovascular: RRR, distal pulses 2+ Breast: Breast symmetrical, no tenderness, no palpable nodules or masses, no skin or nipple retraction present, no nipple discharge.  No axillary or supraclavicular lymphadenopathy. Abdomen: NABS, soft, non-tender, non-distended.  Umbilicus without lesions.  No hepatomegaly, splenomegaly or masses palpable. No evidence of hernia   Genitourinary:  External: Normal external female genitalia.  Normal urethral meatus, normal Bartholin's and Skene's glands.    Vagina: Normal vaginal mucosa, no evidence of prolapse.    Cervix: Grossly normal in appearance, no bleeding  Uterus: Non-enlarged, mobile, normal contour.  No CMT  Adnexa: ovaries non-enlarged, no adnexal masses  Rectal: deferred  Lymphatic: no evidence of inguinal lymphadenopathy Extremities: no edema, erythema, or tenderness Neurologic: Grossly intact Psychiatric: mood appropriate, affect full  Female chaperone present for pelvic and breast  portions of the physical exam    Assessment: 50 y.o. 44 routine annual exam  Plan: Problem List Items Addressed This Visit   None  1) Mammogram - recommend yearly screening mammogram.  Mammogram Is up to date   2) STI screening  wasoffered and declined  3) ASCCP guidelines and rational discussed.  Patient opts for every 3 years screening interval  4) Contraception - the patient is currently using  none.  She is happy with her current form of contraception  and plans to continue  5) Colonoscopy -- Screening recommended starting at age 61 for average risk individuals, age 66 for individuals deemed at increased risk (including African Americans) and recommended to continue until age 82.  For patient age 62-85 individualized approach is recommended.  Gold standard screening is via colonoscopy, Cologuard screening is an acceptable alternative for patient unwilling or unable to undergo colonoscopy.  "Colorectal cancer screening for average?risk adults: 2018 guideline update from the American Cancer Society"CA: A Cancer Journal for Clinicians: Dec 13, 2016   6) Routine healthcare maintenance including cholesterol, diabetes screening discussed managed by PCP  7) Return in about 1 year (around 02/17/2022) for annual. We did discuss her weight and she is encouraged to  contact the [facility].   Mirna Mires, CNM  02/17/2021 1:16 PM   [facility] 02/17/2021, 1:16 PM

## 2021-04-02 ENCOUNTER — Telehealth: Payer: Self-pay | Admitting: Physician Assistant

## 2021-04-02 DIAGNOSIS — E1165 Type 2 diabetes mellitus with hyperglycemia: Secondary | ICD-10-CM

## 2021-04-06 NOTE — Telephone Encounter (Signed)
Pt called back and would like to be scheduled, please advise  Best contact: [phone]

## 2021-04-06 NOTE — Telephone Encounter (Signed)
Left message to call back and schedule a follow up visit.   Thanks,  -Vernona Rieger

## 2021-04-06 NOTE — Telephone Encounter (Signed)
Pt calling to check status. Please advise Patient would also like to schedule with the new provider. She states that she needs a 48mo follow up. Prefers afternoon

## 2021-04-12 ENCOUNTER — Telehealth: Payer: Self-pay

## 2021-04-12 NOTE — Telephone Encounter (Signed)
Copied from CRM [id number]. Topic: Appointment Scheduling - Scheduling Inquiry for Clinic >> Apr 12, 2021  4:09 PM Gaetana Michaelis A wrote: Reason for CRM: Patient would like to be seen by [facility]. Suzie Portela for a 3 month follow up on their A1C as well as right ear concerns   Please contact further when possible  The patient shares that appointments after 1 PM work best for them

## 2021-04-19 ENCOUNTER — Ambulatory Visit: Payer: BC Managed Care – PPO | Admitting: Family Medicine

## 2021-04-19 ENCOUNTER — Other Ambulatory Visit: Payer: Self-pay | Admitting: Physician Assistant

## 2021-04-19 ENCOUNTER — Other Ambulatory Visit: Payer: Self-pay

## 2021-04-19 ENCOUNTER — Encounter: Payer: Self-pay | Admitting: Family Medicine

## 2021-04-19 ENCOUNTER — Other Ambulatory Visit: Payer: Self-pay | Admitting: Family Medicine

## 2021-04-19 VITALS — BP 121/67 | HR 83 | Temp 98.1°F | Resp 18 | Wt 222.0 lb

## 2021-04-19 DIAGNOSIS — H6691 Otitis media, unspecified, right ear: Secondary | ICD-10-CM | POA: Diagnosis not present

## 2021-04-19 DIAGNOSIS — E1169 Type 2 diabetes mellitus with other specified complication: Secondary | ICD-10-CM | POA: Diagnosis not present

## 2021-04-19 DIAGNOSIS — R030 Elevated blood-pressure reading, without diagnosis of hypertension: Secondary | ICD-10-CM

## 2021-04-19 DIAGNOSIS — E1165 Type 2 diabetes mellitus with hyperglycemia: Secondary | ICD-10-CM

## 2021-04-19 DIAGNOSIS — E039 Hypothyroidism, unspecified: Secondary | ICD-10-CM

## 2021-04-19 MED ORDER — AMOXICILLIN 500 MG PO CAPS: 1000.0000 mg | ORAL_CAPSULE | Freq: Two times a day (BID) | ORAL | 0 refills | Status: AC

## 2021-04-19 NOTE — Progress Notes (Signed)
Established patient visit   Patient: Tammie Bush   DOB: Apr 05, 1971   50 y.o. Female  MRN: [id number] Visit Date: 04/19/2021  Today's healthcare provider: Mila Merry, MD   Chief Complaint  Patient presents with   Ear Drainage    Subjective    Ear Drainage  There is pain in the right ear. This is a new problem. Episode onset: 1 month ago. The problem has been gradually worsening. There has been no fever. Associated symptoms include ear discharge and hearing loss. Pertinent negatives include no abdominal pain, coughing, diarrhea, headaches, neck pain, rash, rhinorrhea, sore throat or vomiting. She has tried nothing for the symptoms.  Drainage from ear is yellowish brown. Not painful .     Medications: Outpatient Medications Prior to Visit  Medication Sig   amLODipine (NORVASC) 5 MG tablet TAKE 1 TABLET BY MOUTH EVERY DAY   atorvastatin (LIPITOR) 20 MG tablet Take 1 tablet (20 mg total) by mouth daily.   dapagliflozin propanediol (FARXIGA) 5 MG TABS tablet TAKE 1 TABLET BY MOUTH DAILY BEFORE BREAKFAST.   hydrochlorothiazide (MICROZIDE) 12.5 MG capsule TAKE 1 CAPSULE BY MOUTH EVERY DAY   levothyroxine (SYNTHROID) 25 MCG tablet TAKE 1 TABLET BY MOUTH EVERY DAY   nystatin (MYCOSTATIN/NYSTOP) powder Apply 1 application topically 3 (three) times daily.   Omega-3 Fatty Acids (FISH OIL) 1200 MG CAPS Take 1 capsule by mouth daily.   Sennosides-Docusate Sodium (SENNA-DOCUSATE SODIUM PO) Take by mouth as needed.    glipiZIDE (GLUCOTROL XL) 10 MG 24 hr tablet TAKE 2 TABLETS (20 MG TOTAL) BY MOUTH DAILY WITH BREAKFAST.   omeprazole (PRILOSEC) 20 MG capsule Take 1 capsule (20 mg total) by mouth 2 (two) times daily before a meal.   No facility-administered medications prior to visit.    Review of Systems  HENT:  Positive for ear discharge and hearing loss. Negative for rhinorrhea and sore throat.   Respiratory:  Negative for cough.   Gastrointestinal:  Negative for abdominal pain,  diarrhea and vomiting.  Musculoskeletal:  Negative for neck pain.  Skin:  Negative for rash.  Neurological:  Negative for headaches.      Objective    BP 121/67 (BP Location: Left Arm, Patient Position: Sitting, Cuff Size: Large)   Pulse 83   Temp 98.1 F (36.7 C) (Oral)   Resp 18   Wt 222 lb (100.7 kg)   SpO2 98% Comment: room air  BMI 35.83 kg/m  {Show previous vital signs (optional):23777}  Physical Exam  General Appearance:    Mildly obese female, alert, cooperative, in no acute distress  HENT:   Scant amount of cerumen in right ear canal. No inflammation of ear canal. No other discharge in ear canal. Cloudy being TM no perforation visualized. No erythema.    Eyes:    PERRL, conjunctiva/corneas clear, EOM's intact       Lungs:     Clear to auscultation bilaterally, respirations unlabored  Heart:    Normal heart rate. Normal rhythm. No murmurs, rubs, or gallops.    Neurologic:   Awake, alert, oriented x 3. No apparent focal neurological           defect.         Assessment & Plan     1. Otitis of right ear  - amoxicillin (AMOXIL) 500 MG capsule; Take 2 capsules (1,000 mg total) by mouth 2 (two) times daily for 7 days.  Dispense: 28 capsule; Refill: 0  I suspect drainage  she is having in the morning is cerumen, but she there is not excessive cerumen on exam today. If hearing doesn't return to baseline after finishing antibiotic then consider ENT referral.   2. Type 2 diabetes mellitus with other specified complication, without long-term current use of insulin (HCC) She has follow up scheduled with Merita Norton later this month and would like to get A1c drawn while she is here today.  - Hemoglobin A1c         The entirety of the information documented in the History of Present Illness, Review of Systems and Physical Exam were personally obtained by me. Portions of this information were initially documented by the CMA and reviewed by me for thoroughness and accuracy.      Mila Merry, MD  [facility] [phone] (phone) [phone] (fax)  [facility]

## 2021-04-20 LAB — HEMOGLOBIN A1C
Est. average glucose Bld gHb Est-mCnc: 137 mg/dL
Hgb A1c MFr Bld: 6.4 % — ABNORMAL HIGH (ref 4.8–5.6)

## 2021-05-03 ENCOUNTER — Other Ambulatory Visit: Payer: Self-pay

## 2021-05-03 ENCOUNTER — Encounter: Payer: Self-pay | Admitting: Family Medicine

## 2021-05-03 ENCOUNTER — Ambulatory Visit: Payer: BC Managed Care – PPO | Admitting: Family Medicine

## 2021-05-03 VITALS — BP 132/88 | HR 85 | Temp 97.7°F | Resp 17 | Wt 223.9 lb

## 2021-05-03 DIAGNOSIS — E1159 Type 2 diabetes mellitus with other circulatory complications: Secondary | ICD-10-CM

## 2021-05-03 DIAGNOSIS — E785 Hyperlipidemia, unspecified: Secondary | ICD-10-CM

## 2021-05-03 DIAGNOSIS — H6690 Otitis media, unspecified, unspecified ear: Secondary | ICD-10-CM | POA: Diagnosis not present

## 2021-05-03 DIAGNOSIS — E1169 Type 2 diabetes mellitus with other specified complication: Secondary | ICD-10-CM | POA: Diagnosis not present

## 2021-05-03 DIAGNOSIS — H8110 Benign paroxysmal vertigo, unspecified ear: Secondary | ICD-10-CM | POA: Diagnosis not present

## 2021-05-03 DIAGNOSIS — I152 Hypertension secondary to endocrine disorders: Secondary | ICD-10-CM

## 2021-05-03 MED ORDER — PREDNISONE 20 MG PO TABS: 20.0000 mg | ORAL_TABLET | Freq: Every day | ORAL | 0 refills | Status: DC

## 2021-05-03 MED ORDER — AMOXICILLIN-POT CLAVULANATE ER 1000-62.5 MG PO TB12: 2.0000 | ORAL_TABLET | Freq: Two times a day (BID) | ORAL | 0 refills | Status: AC

## 2021-05-03 MED ORDER — MECLIZINE HCL 25 MG PO TABS: 25.0000 mg | ORAL_TABLET | Freq: Three times a day (TID) | ORAL | 0 refills | Status: DC | PRN

## 2021-05-03 NOTE — Assessment & Plan Note (Signed)
Likely AOM r/t Trial of meclizine

## 2021-05-03 NOTE — Assessment & Plan Note (Signed)
Did not get improvement from abx; new abx provided- as well as short term steroid Encouraged masking and hand hygiene given work exposure

## 2021-05-03 NOTE — Assessment & Plan Note (Signed)
recommend diet low in saturated fat and regular exercise - 30 min at least 5 times per week Discussed importance of healthy weight management Discussed diet and exercise

## 2021-05-03 NOTE — Assessment & Plan Note (Signed)
BMI 36 -htn -hld -dm Discussed importance of healthy weight management Discussed diet and exercise

## 2021-05-03 NOTE — Assessment & Plan Note (Signed)
A1c at goal Denies complications Informed that PO prednisone will elevate BG- pt voiced understanding

## 2021-05-03 NOTE — Progress Notes (Signed)
Established patient visit   Patient: Tammie Bush   DOB: [birth year]   50 y.o. Female  MRN: [id number] Visit Date: 05/03/2021  Today's healthcare provider: Gwyneth Sprout, FNP   Chief Complaint  Patient presents with   Diabetes   Hypertension   Hyperlipidemia   Subjective    Otalgia  There is pain in the right ear. This is a recurrent problem. The current episode started 1 to 4 weeks ago. The problem has been unchanged. There has been no fever. Associated symptoms include ear discharge and hearing loss. Pertinent negatives include no abdominal pain, coughing, diarrhea, headaches, neck pain, rash, rhinorrhea, sore throat or vomiting.   Diabetes Mellitus Type II, follow-up  Lab Results  Component Value Date   HGBA1C 6.4 (H) 04/19/2021   HGBA1C 6.5 (H) 12/27/2020   HGBA1C 6.9 (H) 09/30/2020   Last seen for diabetes 4 months ago.  Management since then includes continuing the same treatment. She reports excellent compliance with treatment. She is not having side effects.   Home blood sugar records:  not being checked  Episodes of hypoglycemia? No   Current insulin regiment: none Most Recent Eye Exam: >1 year 2020 --------------------------------------------------------------------------------------------------- Hypertension, follow-up  BP Readings from Last 3 Encounters:  05/03/21 132/88  04/19/21 121/67  02/17/21 120/80   Wt Readings from Last 3 Encounters:  05/03/21 223 lb 14.4 oz (101.6 kg)  04/19/21 222 lb (100.7 kg)  02/17/21 220 lb (99.8 kg)     She was last seen for hypertension 4 months ago.  Management since that visit includes none. She reports excellent compliance with treatment. She is not having side effects.  She is not exercising. She is not adherent to low salt diet.   Outside blood pressures are not being checked.  She does not smoke.  Use of agents associated with hypertension: none.    --------------------------------------------------------------------------------------------------- Lipid/Cholesterol, follow-up  Last Lipid Panel: Lab Results  Component Value Date   CHOL 177 12/27/2020   LDLCALC 109 (H) 12/27/2020   HDL 47 12/27/2020   TRIG 117 12/27/2020    She was last seen for this 4 months ago.  Management since that visit includes none.  She reports excellent compliance with treatment. She is not having side effects.   Symptoms: No appetite changes No foot ulcerations  Yes chest pain Yes chest pressurYese/discomfort  No dyspnea  orthopnea  Yes fatigue No lower extremity edema  No palpitations No paroxysmal nocturnal dyspnea  No nausea No numbness or tingling of extremity  No polydipsia No polyuria  No speech difficulty No syncope   She is following a Regular diet. Current exercise: no regular exercise  Last metabolic panel Lab Results  Component Value Date   GLUCOSE 122 (H) 12/27/2020   NA 139 12/27/2020   K 4.2 12/27/2020   BUN 16 12/27/2020   CREATININE 0.80 12/27/2020   EGFR 90 12/27/2020   GFRNONAA 104 12/22/2019   CALCIUM 9.4 12/27/2020   AST 17 12/27/2020   ALT 16 12/27/2020   The 10-year ASCVD risk score (Arnett DK, et al., 2019) is: 3.5%  ---------------------------------------------------------------------------------------------------   Medications: Outpatient Medications Prior to Visit  Medication Sig   amLODipine (NORVASC) 5 MG tablet TAKE 1 TABLET BY MOUTH EVERY DAY   atorvastatin (LIPITOR) 20 MG tablet Take 1 tablet (20 mg total) by mouth daily.   FARXIGA 5 MG TABS tablet TAKE 1 TABLET BY MOUTH EVERY DAY BEFORE BREAKFAST   glipiZIDE (GLUCOTROL XL) 10 MG 24  hr tablet TAKE 2 TABLETS BY MOUTH DAILY WITH BREAKFAST.   hydrochlorothiazide (MICROZIDE) 12.5 MG capsule TAKE 1 CAPSULE BY MOUTH EVERY DAY   levothyroxine (SYNTHROID) 25 MCG tablet TAKE 1 TABLET BY MOUTH EVERY DAY   nystatin (MYCOSTATIN/NYSTOP) powder Apply 1  application topically 3 (three) times daily.   Omega-3 Fatty Acids (FISH OIL) 1200 MG CAPS Take 1 capsule by mouth daily.   Sennosides-Docusate Sodium (SENNA-DOCUSATE SODIUM PO) Take by mouth as needed.    omeprazole (PRILOSEC) 20 MG capsule Take 1 capsule (20 mg total) by mouth 2 (two) times daily before a meal.   No facility-administered medications prior to visit.    Review of Systems  HENT:  Positive for ear discharge, ear pain and hearing loss. Negative for rhinorrhea and sore throat.   Respiratory:  Negative for cough.   Gastrointestinal:  Negative for abdominal pain, diarrhea and vomiting.  Musculoskeletal:  Negative for neck pain.  Skin:  Negative for rash.  Neurological:  Negative for headaches.      Objective    BP 132/88   Pulse 85   Temp 97.7 F (36.5 C) (Oral)   Resp 17   Wt 223 lb 14.4 oz (101.6 kg)   BMI 36.14 kg/m    Physical Exam Vitals and nursing note reviewed.  Constitutional:      General: She is not in acute distress.    Appearance: Normal appearance. She is obese. She is not ill-appearing, toxic-appearing or diaphoretic.  HENT:     Head: Normocephalic and atraumatic.     Right Ear: Decreased hearing noted. Drainage, swelling and tenderness present. A middle ear effusion is present. There is mastoid tenderness. Tympanic membrane is erythematous.     Ears:     Comments: Decreased visual cues on L ear exam d/t hx of ear sx as child Cardiovascular:     Rate and Rhythm: Normal rate and regular rhythm.     Pulses: Normal pulses.     Heart sounds: Normal heart sounds. No murmur heard.   No friction rub. No gallop.  Pulmonary:     Effort: Pulmonary effort is normal. No respiratory distress.     Breath sounds: Normal breath sounds. No stridor. No wheezing, rhonchi or rales.  Chest:     Chest wall: No tenderness.  Abdominal:     General: Bowel sounds are normal.     Palpations: Abdomen is soft.  Musculoskeletal:        General: No swelling,  tenderness, deformity or signs of injury. Normal range of motion.     Right lower leg: No edema.     Left lower leg: No edema.  Skin:    General: Skin is warm and dry.     Capillary Refill: Capillary refill takes less than 2 seconds.     Coloration: Skin is not jaundiced or pale.     Findings: No bruising, erythema, lesion or rash.  Neurological:     General: No focal deficit present.     Mental Status: She is alert and oriented to person, place, and time. Mental status is at baseline.     Cranial Nerves: No cranial nerve deficit.     Sensory: No sensory deficit.     Motor: No weakness.     Coordination: Coordination normal.  Psychiatric:        Mood and Affect: Mood normal.        Behavior: Behavior normal.        Thought Content: Thought content normal.  Judgment: Judgment normal.     No results found for any visits on 05/03/21.  Assessment & Plan     Problem List Items Addressed This Visit       Cardiovascular and Mediastinum   Hypertension associated with diabetes ([place])    BP borderline CTM        Endocrine   Diabetes mellitus (HCC)    A1c at goal Denies complications Informed that PO prednisone will elevate BG- pt voiced understanding      Hyperlipidemia associated with type 2 diabetes mellitus ([place])    recommend diet low in saturated fat and regular exercise - 30 min at least 5 times per week Discussed importance of healthy weight management Discussed diet and exercise         Nervous and Auditory   BPPV (benign paroxysmal positional vertigo)    Likely AOM r/t Trial of meclizine      Relevant Medications   meclizine (ANTIVERT) 25 MG tablet   Recurrent AOM (acute otitis media) - Primary    Did not get improvement from abx; new abx provided- as well as short term steroid Encouraged masking and hand hygiene given work exposure      Relevant Medications   amoxicillin-clavulanate (AUGMENTIN XR) 1000-62.5 MG 12 hr tablet   predniSONE (DELTASONE)  20 MG tablet     Other   Morbid obesity (HCC)    BMI 36 -htn -hld -dm Discussed importance of healthy weight management Discussed diet and exercise         Return in about 3 months (around 08/03/2021) for T2DM management.      Vonna Kotyk, FNP, have reviewed all documentation for this visit. The documentation on 05/03/21 for the exam, diagnosis, procedures, and orders are all accurate and complete.    Gwyneth Sprout, [place] [phone] (phone) [phone] (fax)  [place]

## 2021-05-03 NOTE — Assessment & Plan Note (Signed)
BP borderline CTM

## 2021-05-12 ENCOUNTER — Encounter: Payer: Self-pay | Admitting: Family Medicine

## 2021-05-13 ENCOUNTER — Other Ambulatory Visit: Payer: Self-pay

## 2021-05-13 DIAGNOSIS — H6691 Otitis media, unspecified, right ear: Secondary | ICD-10-CM

## 2021-05-22 ENCOUNTER — Other Ambulatory Visit: Payer: Self-pay | Admitting: Family Medicine

## 2021-05-22 DIAGNOSIS — R030 Elevated blood-pressure reading, without diagnosis of hypertension: Secondary | ICD-10-CM

## 2021-05-22 NOTE — Telephone Encounter (Signed)
Requested Prescriptions  Pending Prescriptions Disp Refills  . hydrochlorothiazide (MICROZIDE) 12.5 MG capsule [Pharmacy Med Name: HYDROCHLOROTHIAZIDE 12.5 MG CP] 90 capsule 1    Sig: TAKE 1 CAPSULE BY MOUTH EVERY DAY     Cardiovascular: Diuretics - Thiazide Passed - 05/22/2021  1:13 PM      Passed - Ca in normal range and within 360 days    Calcium  Date Value Ref Range Status  12/27/2020 9.4 8.7 - 10.2 mg/dL Final         Passed - Cr in normal range and within 360 days    Creat  Date Value Ref Range Status  05/07/2017 0.77 0.50 - 1.10 mg/dL Final   Creatinine, Ser  Date Value Ref Range Status  12/27/2020 0.80 0.57 - 1.00 mg/dL Final         Passed - K in normal range and within 360 days    Potassium  Date Value Ref Range Status  12/27/2020 4.2 3.5 - 5.2 mmol/L Final         Passed - Na in normal range and within 360 days    Sodium  Date Value Ref Range Status  12/27/2020 139 134 - 144 mmol/L Final         Passed - Last BP in normal range    BP Readings from Last 1 Encounters:  05/03/21 132/88         Passed - Valid encounter within last 6 months    Recent Outpatient Visits          2 weeks ago Recurrent AOM (acute otitis media)   [facility] Jacky Kindle, FNP   1 month ago Otitis of right ear   [facility] Malva Limes, MD   4 months ago Type 2 diabetes mellitus with other specified complication, without long-term current use of insulin [facility])   [facility] [address] [place], Marzella Schlein, [place]   7 months ago Type 2 diabetes mellitus with hyperglycemia, without long-term current use of insulin [facility])   [facility] [place], [place], [place]   9 months ago Sinus congestion   [facility] [place], Alessandra Bevel[place], [place]      Future Appointments            In 2 months Suzie Portela, Daryl Eastern, FNP [facility]

## 2021-05-27 DIAGNOSIS — I879 Disorder of vein, unspecified: Secondary | ICD-10-CM

## 2021-05-27 HISTORY — DX: Disorder of vein, unspecified: I87.9

## 2021-05-30 ENCOUNTER — Other Ambulatory Visit: Payer: Self-pay | Admitting: Family Medicine

## 2021-05-30 DIAGNOSIS — E1165 Type 2 diabetes mellitus with hyperglycemia: Secondary | ICD-10-CM

## 2021-06-08 ENCOUNTER — Encounter: Payer: Self-pay | Admitting: Family Medicine

## 2021-06-08 DIAGNOSIS — I872 Venous insufficiency (chronic) (peripheral): Secondary | ICD-10-CM | POA: Insufficient documentation

## 2021-06-14 ENCOUNTER — Other Ambulatory Visit: Payer: Self-pay | Admitting: Family Medicine

## 2021-06-14 DIAGNOSIS — E039 Hypothyroidism, unspecified: Secondary | ICD-10-CM

## 2021-06-14 DIAGNOSIS — E1165 Type 2 diabetes mellitus with hyperglycemia: Secondary | ICD-10-CM

## 2021-06-15 NOTE — Telephone Encounter (Signed)
Requested Prescriptions  Pending Prescriptions Disp Refills  . glipiZIDE (GLUCOTROL XL) 10 MG 24 hr tablet [Pharmacy Med Name: GLIPIZIDE ER 10 MG TABLET] 60 tablet 2    Sig: TAKE 2 TABLETS BY MOUTH DAILY WITH BREAKFAST.     Endocrinology:  Diabetes - Sulfonylureas Passed - 06/14/2021  8:33 AM      Passed - HBA1C is between 0 and 7.9 and within 180 days    Hgb A1c MFr Bld  Date Value Ref Range Status  04/19/2021 6.4 (H) 4.8 - 5.6 % Final    Comment:             Prediabetes: 5.7 - 6.4          Diabetes: >6.4          Glycemic control for adults with diabetes: <7.0          Passed - Valid encounter within last 6 months    Recent Outpatient Visits          1 month ago Recurrent AOM (acute otitis media)   [facility] Jacky Kindle, FNP   1 month ago Otitis of right ear   [facility] Malva Limes, MD   5 months ago Type 2 diabetes mellitus with other specified complication, without long-term current use of insulin [facility])   [facility] [place], Marzella Schlein, [place]   8 months ago Type 2 diabetes mellitus with hyperglycemia, without long-term current use of insulin [facility])   [facility] [place], [place], [place]   10 months ago Sinus congestion   [facility] [place], Alessandra [place], [place]      Future Appointments            In 1 month Suzie Portela, Daryl Eastern, FNP [facility]           . levothyroxine (SYNTHROID) 25 MCG tablet [Pharmacy Med Name: LEVOTHYROXINE 25 MCG TABLET] 30 tablet 5    Sig: TAKE 1 TABLET BY MOUTH EVERY DAY     Endocrinology:  Hypothyroid Agents Failed - 06/14/2021  8:33 AM      Failed - TSH needs to be rechecked within 3 months after an abnormal result. Refill until TSH is due.      Passed - TSH in normal range and within 360 days    TSH  Date Value Ref Range Status  12/27/2020 3.470 0.450 - 4.500 uIU/mL Final         Passed - Valid encounter within last 12 months     Recent Outpatient Visits          1 month ago Recurrent AOM (acute otitis media)   [facility] Jacky Kindle, FNP   1 month ago Otitis of right ear   [facility] Malva Limes, MD   5 months ago Type 2 diabetes mellitus with other specified complication, without long-term current use of insulin [facility])   [facility] [place], Marzella Schlein, MD   8 months ago Type 2 diabetes mellitus with hyperglycemia, without long-term current use of insulin [facility])   [facility] [place], [place], [place]   10 months ago Sinus congestion   [facility] [place], Alessandra [place], [place]      Future Appointments            In 1 month Suzie Portela, Daryl Eastern, FNP [facility], [facility]

## 2021-06-23 ENCOUNTER — Encounter: Payer: Self-pay | Admitting: Family Medicine

## 2021-06-23 DIAGNOSIS — I879 Disorder of vein, unspecified: Secondary | ICD-10-CM

## 2021-06-26 ENCOUNTER — Other Ambulatory Visit: Payer: Self-pay | Admitting: Family Medicine

## 2021-06-26 DIAGNOSIS — E1165 Type 2 diabetes mellitus with hyperglycemia: Secondary | ICD-10-CM

## 2021-06-26 NOTE — Telephone Encounter (Signed)
Requested Prescriptions  Pending Prescriptions Disp Refills  . FARXIGA 5 MG TABS tablet [Pharmacy Med Name: FARXIGA 5 MG TABLET] 30 tablet 0    Sig: TAKE 1 TABLET BY MOUTH EVERY DAY BEFORE BREAKFAST     Endocrinology:  Diabetes - SGLT2 Inhibitors Failed - 06/26/2021 10:13 AM      Failed - LDL in normal range and within 360 days    LDL Cholesterol (Calc)  Date Value Ref Range Status  05/07/2017 126 (H) mg/dL (calc) Final    Comment:    Reference range: <100 . Desirable range <100 mg/dL for primary prevention;   <70 mg/dL for patients with CHD or diabetic patients  with > or = 2 CHD risk factors. Marland Kitchen LDL-C is now calculated using the Martin-Hopkins  calculation, which is a validated novel method providing  better accuracy than the Friedewald equation in the  estimation of LDL-C.  Cresenciano Genre et al. Annamaria Helling. 4742;595(63): 2061-2068  (http://education.[employer].com/faq/FAQ164)    LDL Chol Calc (NIH)  Date Value Ref Range Status  12/27/2020 109 (H) 0 - 99 mg/dL Final         Passed - Cr in normal range and within 360 days    Creat  Date Value Ref Range Status  05/07/2017 0.77 0.50 - 1.10 mg/dL Final   Creatinine, Ser  Date Value Ref Range Status  12/27/2020 0.80 0.57 - 1.00 mg/dL Final         Passed - HBA1C is between 0 and 7.9 and within 180 days    Hgb A1c MFr Bld  Date Value Ref Range Status  04/19/2021 6.4 (H) 4.8 - 5.6 % Final    Comment:             Prediabetes: 5.7 - 6.4          Diabetes: >6.4          Glycemic control for adults with diabetes: <7.0          Passed - eGFR in normal range and within 360 days    GFR, Est African American  Date Value Ref Range Status  05/07/2017 107 > OR = 60 mL/min/1.8m Final   GFR calc Af Amer  Date Value Ref Range Status  12/22/2019 119 >59 mL/min/1.73 Final    Comment:    **[facility] currently reports eGFR in compliance with the current**   recommendations of the [employer] [facility] will   update  reporting as new guidelines are published from the NKF-ASN   Task force.    GFR, Est Non African American  Date Value Ref Range Status  05/07/2017 93 > OR = 60 mL/min/1.761mFinal   GFR calc non Af Amer  Date Value Ref Range Status  12/22/2019 104 >59 mL/min/1.73 Final   eGFR  Date Value Ref Range Status  12/27/2020 90 >59 mL/min/1.73 Final         Passed - Valid encounter within last 6 months    Recent Outpatient Visits          1 month ago Recurrent A[facility] (acute otitis media)   [facility]   2 months ago Otitis of right ear   [facility]   5 months ago Type 2 diabetes mellitus with other specified complication, without long-term current use of insulin ([facility]  [facility]   8 months ago Type 2 diabetes mellitus with hyperglycemia, without long-term current use of insulin (  [facility])   [place], [place]   10 months ago Sinus congestion   [place], Clearnce [place], [place]      Future Appointments            In 1 month Gwyneth Sprout, FNP [facility], [place]            Last OV 01/03/21 (addresses medication)

## 2021-07-26 ENCOUNTER — Other Ambulatory Visit: Payer: Self-pay | Admitting: Family Medicine

## 2021-07-26 DIAGNOSIS — E1165 Type 2 diabetes mellitus with hyperglycemia: Secondary | ICD-10-CM

## 2021-07-26 NOTE — Telephone Encounter (Signed)
Requested Prescriptions  Pending Prescriptions Disp Refills   FARXIGA 5 MG TABS tablet [Pharmacy Med Name: FARXIGA 5 MG TABLET] 30 tablet 0    Sig: TAKE 1 TABLET BY MOUTH EVERY DAY BEFORE BREAKFAST     Endocrinology:  Diabetes - SGLT2 Inhibitors Failed - 07/26/2021  1:32 AM      Failed - LDL in normal range and within 360 days    LDL Cholesterol (Calc)  Date Value Ref Range Status  05/07/2017 126 (H) mg/dL (calc) Final    Comment:    Reference range: <100 . Desirable range <100 mg/dL for primary prevention;   <70 mg/dL for patients with CHD or diabetic patients  with > or = 2 CHD risk factors. Marland Kitchen LDL-C is now calculated using the Martin-Hopkins  calculation, which is a validated novel method providing  better accuracy than the Friedewald equation in the  estimation of LDL-C.  Cresenciano Genre et al. Annamaria Helling. 1517;616(07): 2061-2068  (http://education.[facility].com/faq/FAQ164)    LDL Chol Calc (NIH)  Date Value Ref Range Status  12/27/2020 109 (H) 0 - 99 mg/dL Final         Passed - Cr in normal range and within 360 days    Creat  Date Value Ref Range Status  05/07/2017 0.77 0.50 - 1.10 mg/dL Final   Creatinine, Ser  Date Value Ref Range Status  12/27/2020 0.80 0.57 - 1.00 mg/dL Final         Passed - HBA1C is between 0 and 7.9 and within 180 days    Hgb A1c MFr Bld  Date Value Ref Range Status  04/19/2021 6.4 (H) 4.8 - 5.6 % Final    Comment:             Prediabetes: 5.7 - 6.4          Diabetes: >6.4          Glycemic control for adults with diabetes: <7.0          Passed - eGFR in normal range and within 360 days    GFR, Est African American  Date Value Ref Range Status  05/07/2017 107 > OR = 60 mL/min/1.29m Final   GFR calc Af Amer  Date Value Ref Range Status  12/22/2019 119 >59 mL/min/1.73 Final    Comment:    **[facility] currently reports eGFR in compliance with the current**   recommendations of the [employer] [facility] will   update  reporting as new guidelines are published from the NKF-ASN   Task force.    GFR, Est Non African American  Date Value Ref Range Status  05/07/2017 93 > OR = 60 mL/min/1.783mFinal   GFR calc non Af Amer  Date Value Ref Range Status  12/22/2019 104 >59 mL/min/1.73 Final   eGFR  Date Value Ref Range Status  12/27/2020 90 >59 mL/min/1.73 Final         Passed - Valid encounter within last 6 months    Recent Outpatient Visits          2 months ago Rec[facility] AOM ([facility] otitis media)   [facility]   3 months [facility] right [facility]   6 months ago Type 2 diabetes mellitus with other specified complication, [facility] long-term current use of [facility]   9 months ago Type 2 diabetes mellitus with hyperglycemia, without long-term current use of  insulin [facility])   [place], [place]   11 months ago Sinus congestion   [facility], Clearnce Sorrel, [place]      Future Appointments            In 1 week Gwyneth Sprout, [place], [place]

## 2021-08-04 ENCOUNTER — Encounter: Payer: Self-pay | Admitting: Family Medicine

## 2021-08-04 ENCOUNTER — Ambulatory Visit: Payer: BC Managed Care – PPO | Admitting: Family Medicine

## 2021-08-04 ENCOUNTER — Other Ambulatory Visit: Payer: Self-pay

## 2021-08-04 VITALS — BP 121/50 | HR 84 | Resp 15 | Wt 229.4 lb

## 2021-08-04 DIAGNOSIS — I152 Hypertension secondary to endocrine disorders: Secondary | ICD-10-CM

## 2021-08-04 DIAGNOSIS — I879 Disorder of vein, unspecified: Secondary | ICD-10-CM

## 2021-08-04 DIAGNOSIS — E1169 Type 2 diabetes mellitus with other specified complication: Secondary | ICD-10-CM

## 2021-08-04 DIAGNOSIS — E1165 Type 2 diabetes mellitus with hyperglycemia: Secondary | ICD-10-CM | POA: Diagnosis not present

## 2021-08-04 DIAGNOSIS — I10 Essential (primary) hypertension: Secondary | ICD-10-CM | POA: Diagnosis not present

## 2021-08-04 DIAGNOSIS — E1159 Type 2 diabetes mellitus with other circulatory complications: Secondary | ICD-10-CM

## 2021-08-04 LAB — POCT GLYCOSYLATED HEMOGLOBIN (HGB A1C): Hemoglobin A1C: 6.6 % — AB (ref 4.0–5.6)

## 2021-08-04 MED ORDER — AMLODIPINE BESYLATE 5 MG PO TABS: 5.0000 mg | ORAL_TABLET | Freq: Every day | ORAL | 1 refills | Status: DC

## 2021-08-04 MED ORDER — DAPAGLIFLOZIN PROPANEDIOL 5 MG PO TABS: 5.0000 mg | ORAL_TABLET | Freq: Every day | ORAL | 3 refills | Status: DC

## 2021-08-04 MED ORDER — GLIPIZIDE ER 10 MG PO TB24: 20.0000 mg | ORAL_TABLET | Freq: Every day | ORAL | 3 refills | Status: DC

## 2021-08-04 NOTE — Assessment & Plan Note (Signed)
Chronic, stable Denies CP Denies SOB Denies DOE No LE Edema noted on exam; wearing TED hose Continue medication Refills provided Seek emergent care if you develop CP, chest pain or chest pressure

## 2021-08-04 NOTE — Assessment & Plan Note (Signed)
No LE edema Wearing compression stocking Referral to podiatry

## 2021-08-04 NOTE — Progress Notes (Signed)
Established patient visit   Patient: Tammie Bush   DOB: Jan 02, 1971   51 y.o. Female  MRN: [id number] Visit Date: 08/04/2021  Today's healthcare provider: Gwyneth Sprout, FNP   Chief Complaint  Patient presents with   Hypertension   Hyperlipidemia   Diabetes   Subjective    HPI  Diabetes Mellitus Type II, follow-up  Lab Results  Component Value Date   HGBA1C 6.6 (A) 08/04/2021   HGBA1C 6.4 (H) 04/19/2021   HGBA1C 6.5 (H) 12/27/2020   Last seen for diabetes 3 months ago.  Management since then includes continuing the same treatment. She reports excellent compliance with treatment. She is not having side effects.   Home blood sugar records:  not checked  Episodes of hypoglycemia? No    Current insulin regiment: none Most Recent Eye Exam: >12 mo; plans to call to set up an appt soon  --------------------------------------------------------------------------------------------------- Hypertension, follow-up  BP Readings from Last 3 Encounters:  08/04/21 (!) 121/50  05/03/21 132/88  04/19/21 121/67   Wt Readings from Last 3 Encounters:  08/04/21 229 lb 6.4 oz (104.1 kg)  05/03/21 223 lb 14.4 oz (101.6 kg)  04/19/21 222 lb (100.7 kg)     She was last seen for hypertension 3 months ago.  BP at that visit was 132/88 . Management since that visit includes none. She reports excellent compliance with treatment. She is not having side effects.  She is exercising. She is adherent to low salt diet.   Outside blood pressures are not checked.  She does not smoke.  Use of agents associated with hypertension: none.   --------------------------------------------------------------------------------------------------- Lipid/Cholesterol, follow-up  Last Lipid Panel: Lab Results  Component Value Date   CHOL 177 12/27/2020   LDLCALC 109 (H) 12/27/2020   HDL 47 12/27/2020   TRIG 117 12/27/2020    She was last seen for this 3 months ago.  Management since that  visit includes recommend diet low in saturated fat and regular exercise - 30 min at least 5 times per week. Patient reports good compliance on Atorvastatin Symptoms: No appetite changes No foot ulcerations  No  chest pain No chest pressure/discomfort  No dyspnea No orthopnea  Yes fatigue No lower extremity edema  No palpitations No paroxysmal nocturnal dyspnea  No nausea Yes numbness or tingling of extremity  No polydipsia No polyuria  No speech difficulty No syncope   She is following a Regular diet. Current exercise: walking  Last metabolic panel Lab Results  Component Value Date   GLUCOSE 122 (H) 12/27/2020   NA 139 12/27/2020   K 4.2 12/27/2020   BUN 16 12/27/2020   CREATININE 0.80 12/27/2020   EGFR 90 12/27/2020   GFRNONAA 104 12/22/2019   CALCIUM 9.4 12/27/2020   AST 17 12/27/2020   ALT 16 12/27/2020   The 10-year ASCVD risk score (Arnett DK, et al., 2019) is: 3%  ---------------------------------------------------------------------------------------------------   Medications: Outpatient Medications Prior to Visit  Medication Sig   atorvastatin (LIPITOR) 20 MG tablet Take 1 tablet (20 mg total) by mouth daily.   hydrochlorothiazide (MICROZIDE) 12.5 MG capsule TAKE 1 CAPSULE BY MOUTH EVERY DAY   levothyroxine (SYNTHROID) 25 MCG tablet TAKE 1 TABLET BY MOUTH EVERY DAY   meclizine (ANTIVERT) 25 MG tablet Take 1 tablet (25 mg total) by mouth 3 (three) times daily as needed for dizziness.   nystatin (MYCOSTATIN/NYSTOP) powder Apply 1 application topically 3 (three) times daily.   Omega-3 Fatty Acids (FISH OIL) 1200 MG  CAPS Take 1 capsule by mouth daily.   omeprazole (PRILOSEC) 20 MG capsule Take 1 capsule (20 mg total) by mouth 2 (two) times daily before a meal.   Sennosides-Docusate Sodium (SENNA-DOCUSATE SODIUM PO) Take by mouth as needed.    [DISCONTINUED] amLODipine (NORVASC) 5 MG tablet TAKE 1 TABLET BY MOUTH EVERY DAY   [DISCONTINUED] FARXIGA 5 MG TABS tablet TAKE  1 TABLET BY MOUTH EVERY DAY BEFORE BREAKFAST   [DISCONTINUED] glipiZIDE (GLUCOTROL XL) 10 MG 24 hr tablet TAKE 2 TABLETS BY MOUTH DAILY WITH BREAKFAST.   [DISCONTINUED] predniSONE (DELTASONE) 20 MG tablet Take 1 tablet (20 mg total) by mouth daily with breakfast.   No facility-administered medications prior to visit.    Review of Systems     Objective    BP (!) 121/50    Pulse 84    Resp 15    Wt 229 lb 6.4 oz (104.1 kg)    SpO2 100%    BMI 37.03 kg/m   Physical Exam Vitals and nursing note reviewed.  Constitutional:      General: She is not in acute distress.    Appearance: Normal appearance. She is obese. She is not ill-appearing, toxic-appearing or diaphoretic.  HENT:     Head: Normocephalic and atraumatic.  Cardiovascular:     Rate and Rhythm: Normal rate and regular rhythm.     Pulses: Normal pulses.     Heart sounds: Normal heart sounds. No murmur heard.   No friction rub. No gallop.  Pulmonary:     Effort: Pulmonary effort is normal. No respiratory distress.     Breath sounds: Normal breath sounds. No stridor. No wheezing, rhonchi or rales.  Chest:     Chest wall: No tenderness.  Abdominal:     General: Bowel sounds are normal.     Palpations: Abdomen is soft.  Musculoskeletal:        General: No swelling, tenderness, deformity or signs of injury. Normal range of motion.     Right lower leg: No edema.     Left lower leg: No edema.  Skin:    General: Skin is warm and dry.     Capillary Refill: Capillary refill takes less than 2 seconds.     Coloration: Skin is not jaundiced or pale.     Findings: No bruising, erythema, lesion or rash.  Neurological:     General: No focal deficit present.     Mental Status: She is alert and oriented to person, place, and time. Mental status is at baseline.     Cranial Nerves: No cranial nerve deficit.     Sensory: No sensory deficit.     Motor: No weakness.     Coordination: Coordination normal.  Psychiatric:        Mood and  Affect: Mood normal.        Behavior: Behavior normal.        Thought Content: Thought content normal.        Judgment: Judgment normal.     Results for orders placed or performed in visit on 08/04/21  POCT glycosylated hemoglobin (Hb A1C)  Result Value Ref Range   Hemoglobin A1C 6.6 (A) 4.0 - 5.6 %   HbA1c POC (<> result, manual entry)     HbA1c, POC (prediabetic range)     HbA1c, POC (controlled diabetic range)      Assessment & Plan     Problem List Items Addressed This Visit       Cardiovascular and  Mediastinum   Hypertension associated with diabetes ([place])    Chronic, stable Denies CP Denies SOB Denies DOE No LE Edema noted on exam; wearing TED hose Continue medication Refills provided Seek emergent care if you develop CP, chest pain or chest pressure       Relevant Medications   amLODipine (NORVASC) 5 MG tablet   dapagliflozin propanediol (FARXIGA) 5 MG TABS tablet   glipiZIDE (GLUCOTROL XL) 10 MG 24 hr tablet   Other Relevant Orders   Ambulatory referral to Podiatry   Venous disease    No LE edema Wearing compression stocking Referral to podiatry      Relevant Medications   amLODipine (NORVASC) 5 MG tablet   Other Relevant Orders   Ambulatory referral to Podiatry     Endocrine   Diabetes mellitus ([place]) - Primary    Continue to recommend balanced, lower carb meals. Smaller meal size, adding snacks. Choosing water as drink of choice and increasing purposeful exercise. A1c remains stable      Relevant Medications   dapagliflozin propanediol (FARXIGA) 5 MG TABS tablet   glipiZIDE (GLUCOTROL XL) 10 MG 24 hr tablet   Other Relevant Orders   POCT glycosylated hemoglobin (Hb A1C) (Completed)   Urine Microalbumin w/creat. ratio   Ambulatory referral to Podiatry     Other   Morbid obesity (HCC)    BMI 37 HTN HLD DM      Relevant Medications   dapagliflozin propanediol (FARXIGA) 5 MG TABS tablet   glipiZIDE (GLUCOTROL XL) 10 MG 24 hr tablet    Other Visit Diagnoses     Essential hypertension       Relevant Medications   amLODipine (NORVASC) 5 MG tablet        Return in about 6 months (around 02/01/2022) for annual examination.      Vonna Kotyk, FNP, have reviewed all documentation for this visit. The documentation on 08/04/21 for the exam, diagnosis, procedures, and orders are all accurate and complete.  Patient seen and examined by Tally Joe,  FNP note scribed by [employer], [place], [place] [phone] (phone) [phone] (fax)  [place]

## 2021-08-04 NOTE — Assessment & Plan Note (Signed)
BMI 37 HTN HLD DM

## 2021-08-04 NOTE — Assessment & Plan Note (Signed)
Continue to recommend balanced, lower carb meals. Smaller meal size, adding snacks. Choosing water as drink of choice and increasing purposeful exercise. A1c remains stable

## 2021-08-07 LAB — MICROALBUMIN / CREATININE URINE RATIO
Creatinine, Urine: 45.7 mg/dL
Microalb/Creat Ratio: 7 mg/g creat (ref 0–29)
Microalbumin, Urine: 3.1 ug/mL

## 2021-08-07 LAB — SPECIMEN STATUS REPORT

## 2021-08-08 ENCOUNTER — Encounter: Payer: Self-pay | Admitting: Family Medicine

## 2021-09-23 LAB — HM DIABETES EYE EXAM

## 2021-10-21 ENCOUNTER — Encounter: Payer: Self-pay | Admitting: Family Medicine

## 2021-10-21 ENCOUNTER — Ambulatory Visit: Payer: BC Managed Care – PPO | Admitting: Family Medicine

## 2021-10-21 VITALS — BP 125/89 | HR 67 | Temp 97.9°F | Resp 16 | Wt 229.5 lb

## 2021-10-21 DIAGNOSIS — N3281 Overactive bladder: Secondary | ICD-10-CM | POA: Diagnosis not present

## 2021-10-21 DIAGNOSIS — E1169 Type 2 diabetes mellitus with other specified complication: Secondary | ICD-10-CM

## 2021-10-21 DIAGNOSIS — R350 Frequency of micturition: Secondary | ICD-10-CM | POA: Diagnosis not present

## 2021-10-21 DIAGNOSIS — T63301A Toxic effect of unspecified spider venom, accidental (unintentional), initial encounter: Secondary | ICD-10-CM | POA: Insufficient documentation

## 2021-10-21 LAB — POCT URINALYSIS DIPSTICK
Bilirubin, UA: NEGATIVE
Blood, UA: NEGATIVE
Glucose, UA: POSITIVE — AB
Ketones, UA: NEGATIVE
Leukocytes, UA: NEGATIVE
Nitrite, UA: NEGATIVE
Protein, UA: NEGATIVE
Spec Grav, UA: 1.01 (ref 1.010–1.025)
Urobilinogen, UA: 0.2 E.U./dL
pH, UA: 5 (ref 5.0–8.0)

## 2021-10-21 LAB — POCT GLYCOSYLATED HEMOGLOBIN (HGB A1C): Hemoglobin A1C: 6.5 % — AB (ref 4.0–5.6)

## 2021-10-21 MED ORDER — CEPHALEXIN 500 MG PO CAPS: 500.0000 mg | ORAL_CAPSULE | Freq: Four times a day (QID) | ORAL | 0 refills | Status: AC

## 2021-10-21 MED ORDER — OXYBUTYNIN CHLORIDE ER 5 MG PO TB24: 20.0000 mg | ORAL_TABLET | Freq: Every day | ORAL | 1 refills | Status: DC

## 2021-10-21 NOTE — Assessment & Plan Note (Signed)
Negative UA for UTI ?+2 glucose only; A1c stable ?Continue to recommend balanced, lower carb meals. Smaller meal size, adding snacks. Choosing water as drink of choice and increasing purposeful exercise. ?Will send for cx ?

## 2021-10-21 NOTE — Progress Notes (Signed)
?  ?[employer] as a Neurosurgeon for Jacky Kindle, FNP.,have documented all relevant documentation on the behalf of Jacky Kindle, FNP,as directed by  Jacky Kindle, FNP while in the presence of Jacky Kindle, FNP.  ?[facility] ? ?Established patient visit ? ?Patient: Tammie Bush   DOB: 22-Apr-1971   50 y.o. Female  MRN: [id number] ?Visit Date: 10/21/2021 ? ?Today's healthcare provider: Jacky Kindle, FNP  ? ?Re-Introduced to nurse practitioner role and practice setting.  All questions answered.  Discussed provider/patient relationship and expectations. ? ?Chief Complaint  ?Patient presents with  ? Urinary Frequency  ? ?Subjective  ?  ?Urinary Frequency  ?This is a new problem. The current episode started 1 to 4 weeks ago. The problem occurs every urination. The problem has been gradually worsening. There has been no fever. Associated symptoms include flank pain, frequency and urgency. Pertinent negatives include no chills, discharge, hematuria, hesitancy, nausea, possible pregnancy, sweats or vomiting. She has tried increased fluids for the symptoms.   ? ?Medications: ?Outpatient Medications Prior to Visit  ?Medication Sig  ? amLODipine (NORVASC) 5 MG tablet Take 1 tablet (5 mg total) by mouth daily.  ? atorvastatin (LIPITOR) 20 MG tablet Take 1 tablet (20 mg total) by mouth daily.  ? dapagliflozin propanediol (FARXIGA) 5 MG TABS tablet Take 1 tablet (5 mg total) by mouth daily.  ? glipiZIDE (GLUCOTROL XL) 10 MG 24 hr tablet Take 2 tablets (20 mg total) by mouth daily with breakfast.  ? hydrochlorothiazide (MICROZIDE) 12.5 MG capsule TAKE 1 CAPSULE BY MOUTH EVERY DAY  ? levothyroxine (SYNTHROID) 25 MCG tablet TAKE 1 TABLET BY MOUTH EVERY DAY  ? meclizine (ANTIVERT) 25 MG tablet Take 1 tablet (25 mg total) by mouth 3 (three) times daily as needed for dizziness.  ? nystatin (MYCOSTATIN/NYSTOP) powder Apply 1 application topically 3 (three) times daily.  ? Omega-3 Fatty Acids (FISH OIL)  1200 MG CAPS Take 1 capsule by mouth daily.  ? Sennosides-Docusate Sodium (SENNA-DOCUSATE SODIUM PO) Take by mouth as needed.   ? omeprazole (PRILOSEC) 20 MG capsule Take 1 capsule (20 mg total) by mouth 2 (two) times daily before a meal.  ? ?No facility-administered medications prior to visit.  ? ?Review of Systems  ?Constitutional:  Negative for chills.  ?Gastrointestinal:  Negative for nausea and vomiting.  ?Genitourinary:  Positive for dysuria, flank pain, frequency and urgency. Negative for hematuria and hesitancy.  ? ?  Objective  ?  ?BP 125/89   Pulse 67   Temp 97.9 ?F (36.6 ?C) (Temporal)   Resp 16   Wt 229 lb 8 oz (104.1 kg)   SpO2 97%   BMI 37.04 kg/m?  ? ?Physical Exam ?Vitals and nursing note reviewed.  ?Constitutional:   ?   General: She is not in acute distress. ?   Appearance: Normal appearance. She is obese. She is not ill-appearing, toxic-appearing or diaphoretic.  ?HENT:  ?   Head: Normocephalic and atraumatic.  ?Cardiovascular:  ?   Rate and Rhythm: Normal rate and regular rhythm.  ?   Pulses: Normal pulses.  ?   Heart sounds: Normal heart sounds. No murmur heard. ?  No friction rub. No gallop.  ?Pulmonary:  ?   Effort: Pulmonary effort is normal. No respiratory distress.  ?   Breath sounds: Normal breath sounds. No stridor. No wheezing, rhonchi or rales.  ?Chest:  ?   Chest wall: No tenderness.  ?Abdominal:  ?   General:  Bowel sounds are normal.  ?   Palpations: Abdomen is soft.  ?Musculoskeletal:     ?   General: No swelling, tenderness, deformity or signs of injury. Normal range of motion.  ?   Right lower leg: No edema.  ?   Left lower leg: No edema.  ?Skin: ?   General: Skin is warm and dry.  ?   Capillary Refill: Capillary refill takes less than 2 seconds.  ?   Coloration: Skin is not jaundiced or pale.  ?   Findings: No bruising, erythema, lesion or rash.  ?Neurological:  ?   General: No focal deficit present.  ?   Mental Status: She is alert and oriented to person, place, and time.  Mental status is at baseline.  ?   Cranial Nerves: No cranial nerve deficit.  ?   Sensory: No sensory deficit.  ?   Motor: No weakness.  ?   Coordination: Coordination normal.  ?Psychiatric:     ?   Mood and Affect: Mood normal.     ?   Behavior: Behavior normal.     ?   Thought Content: Thought content normal.     ?   Judgment: Judgment normal.  ? ? ?No results found for any visits on 10/21/21. ? Assessment & Plan  ?  ? ?Problem List Items Addressed This Visit   ? ?  ? Endocrine  ? Diabetes mellitus (HCC)  ? Relevant Orders  ? POCT glycosylated hemoglobin (Hb A1C)  ?  ? Genitourinary  ? Overactive bladder  ?  Trial of medication with directions to titrate ?Will f/u in 1 month ?If not improved, will refer to urology ? ?  ?  ? Relevant Medications  ? oxybutynin (DITROPAN XL) 5 MG 24 hr tablet  ?  ? Other  ? Urinary frequency - Primary  ?  Negative UA for UTI ?+2 glucose only; A1c stable ?Continue to recommend balanced, lower carb meals. Smaller meal size, adding snacks. Choosing water as drink of choice and increasing purposeful exercise. ?Will send for cx ?  ?  ? Relevant Orders  ? Urine Culture  ? POCT urinalysis dipstick  ? Spider bite  ?  Acute concern, worsening d/t use of hair net on nape of neck ?Treat with ABX ?No systemic side effects at this time ?Encourage best compliance with medication every 6 hours  ?  ?  ? Relevant Medications  ? cephALEXin (KEFLEX) 500 MG capsule  ? ?Return in about 4 weeks (around 11/18/2021) for chonic disease management.  ?  ?I, Jacky Kindle, FNP, have reviewed all documentation for this visit. The documentation on 10/21/21 for the exam, diagnosis, procedures, and orders are all accurate and complete. ? ?Jacky Kindle, FNP  ?[facility] ?[phone] (phone) ?[phone] (fax) ? ?

## 2021-10-21 NOTE — Assessment & Plan Note (Signed)
Trial of medication with directions to titrate ?Will f/u in 1 month ?If not improved, will refer to urology ? ?

## 2021-10-21 NOTE — Assessment & Plan Note (Signed)
Acute concern, worsening d/t use of hair net on nape of neck ?Treat with ABX ?No systemic side effects at this time ?Encourage best compliance with medication every 6 hours  ?

## 2021-10-26 LAB — HM DIABETES EYE EXAM

## 2021-10-27 ENCOUNTER — Encounter: Payer: Self-pay | Admitting: Family Medicine

## 2021-10-31 ENCOUNTER — Encounter: Payer: Self-pay | Admitting: Family Medicine

## 2021-11-12 ENCOUNTER — Other Ambulatory Visit: Payer: Self-pay | Admitting: Family Medicine

## 2021-11-12 DIAGNOSIS — N3281 Overactive bladder: Secondary | ICD-10-CM

## 2021-11-14 NOTE — Telephone Encounter (Signed)
Requested medication (s) are due for refill today:  ? ?Requested medication (s) are on the active medication list: Yes ? ?Last refill:  10/21/21 ? ?Future visit scheduled: Yes ? ?Notes to clinic:  See request. ? ? ? ?Requested Prescriptions  ?Pending Prescriptions Disp Refills  ? oxybutynin (DITROPAN-XL) 5 MG 24 hr tablet [Pharmacy Med Name: OXYBUTYNIN CL ER 5 MG TABLET] 120 tablet 1  ?  Sig: START WITH 5 MG DOSE, RE-EVALUATE AFTER 1 WEEK, INCREASE BY 1 ADDITIONAL TABLET. CONTINUE UNTIL YOU REACH COMFORTABLE RELIEF. MAX OF 6 TABLETS PER DAY, 30 MG.  ?  ? Urology:  Bladder Agents Passed - 11/12/2021  8:32 AM  ?  ?  Passed - Valid encounter within last 12 months  ?  Recent Outpatient Visits   ? ?      ? 3 weeks ago Urinary frequency  ? [facility] Merita Norton T, FNP  ? 3 months ago Type 2 diabetes mellitus with other specified complication, without long-term current use of insulin (HCC)  ? [facility] Merita Norton T, FNP  ? 6 months ago Recurrent AOM (acute otitis media)  ? [facility] Merita Norton T, FNP  ? 6 months ago Otitis of right ear  ? [facility] Malva Limes, MD  ? 10 months ago Type 2 diabetes mellitus with other specified complication, without long-term current use of insulin (HCC)  ? [facility] [place], Marzella Schlein, MD  ? ?  ?  ?Future Appointments   ? ?        ? In 2 months Jacky Kindle, FNP [facility]  ? ?  ? ? ?  ?  ?  ? ?

## 2021-11-23 ENCOUNTER — Other Ambulatory Visit: Payer: Self-pay | Admitting: Family Medicine

## 2021-11-23 DIAGNOSIS — R030 Elevated blood-pressure reading, without diagnosis of hypertension: Secondary | ICD-10-CM

## 2021-12-09 ENCOUNTER — Other Ambulatory Visit: Payer: Self-pay | Admitting: Family Medicine

## 2021-12-09 DIAGNOSIS — E039 Hypothyroidism, unspecified: Secondary | ICD-10-CM

## 2021-12-13 ENCOUNTER — Encounter: Payer: Self-pay | Admitting: Family Medicine

## 2022-01-01 ENCOUNTER — Other Ambulatory Visit: Payer: Self-pay | Admitting: Family Medicine

## 2022-01-27 ENCOUNTER — Other Ambulatory Visit: Payer: Self-pay | Admitting: Family Medicine

## 2022-01-27 ENCOUNTER — Encounter: Payer: Self-pay | Admitting: Family Medicine

## 2022-01-27 DIAGNOSIS — Z1231 Encounter for screening mammogram for malignant neoplasm of breast: Secondary | ICD-10-CM

## 2022-01-27 DIAGNOSIS — E1169 Type 2 diabetes mellitus with other specified complication: Secondary | ICD-10-CM

## 2022-01-27 DIAGNOSIS — Z0000 Encounter for general adult medical examination without abnormal findings: Secondary | ICD-10-CM

## 2022-01-30 IMAGING — MG MM DIGITAL SCREENING BILAT W/ TOMO AND CAD
8 of 14 series · 8 of 40 positions shown · non-contrast
Comparison: Previous exam(s).

CLINICAL DATA: Screening.

EXAM:
DIGITAL SCREENING BILATERAL MAMMOGRAM WITH TOMOSYNTHESIS AND CAD
TECHNIQUE: Bilateral screening digital craniocaudal and mediolateral oblique
mammograms were obtained. Bilateral screening digital breast
tomosynthesis was performed. The images were evaluated with
computer-aided detection.

[L MLO synth-2D]
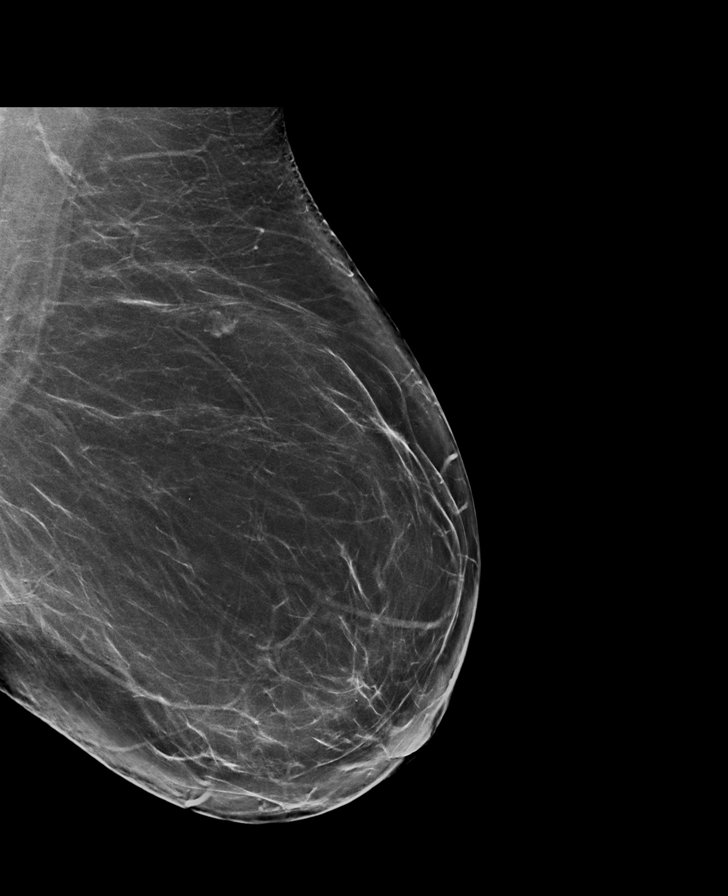

[R CC synth-2D (1 of 2)]
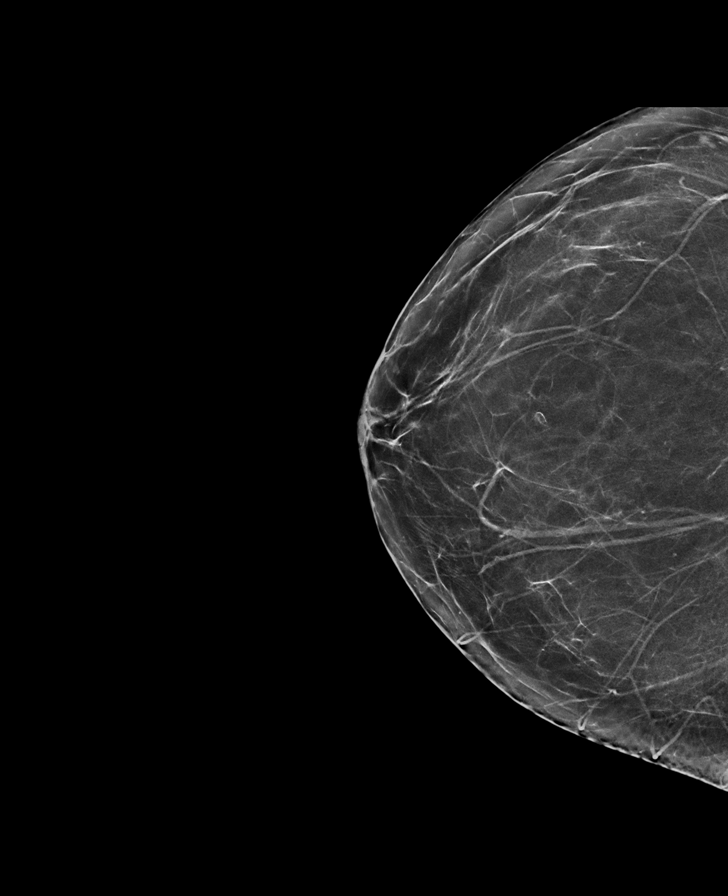

[R CC synth-2D (2 of 2)]
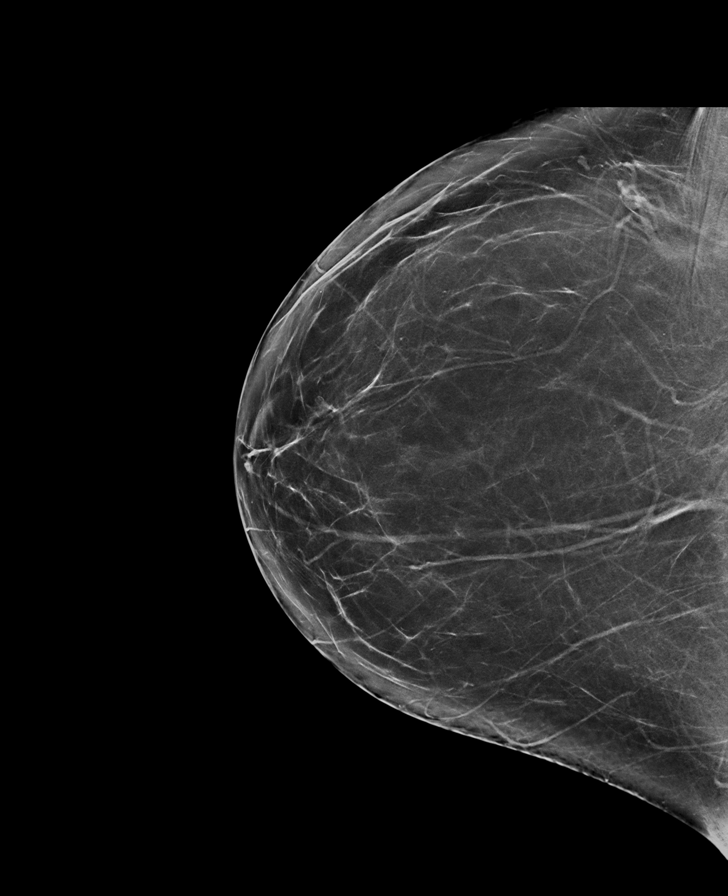

[R MLO synth-2D (1 of 2)]
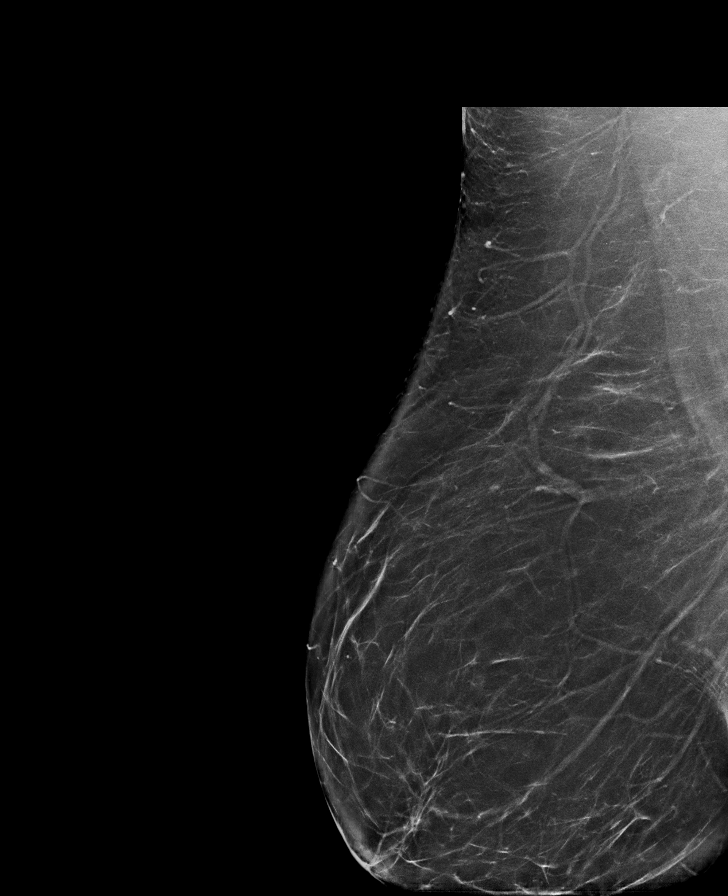

[R MLO synth-2D (2 of 2)]
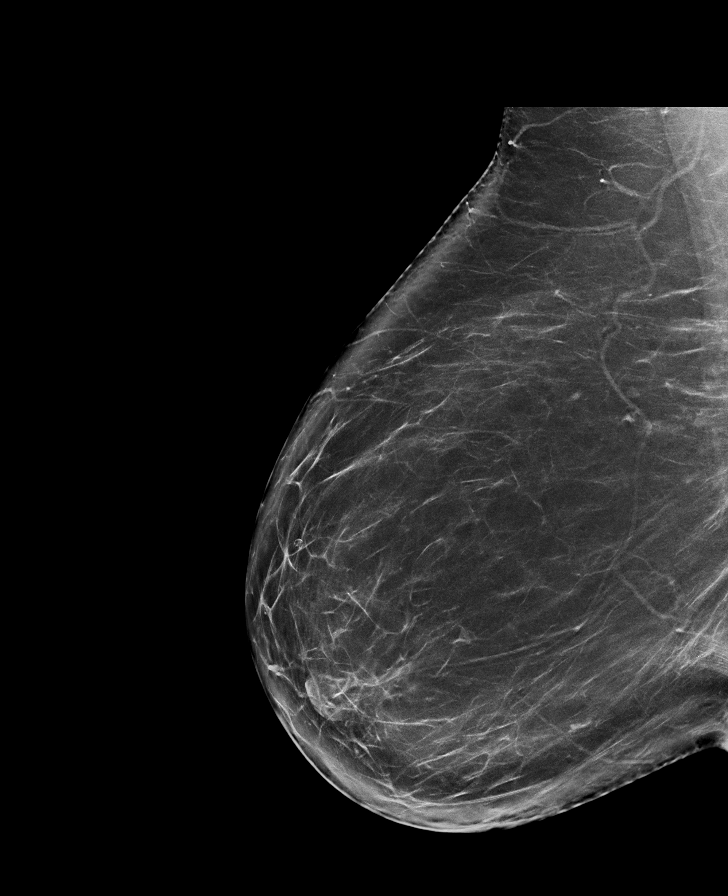

[L CC synth-2D (1 of 2)]
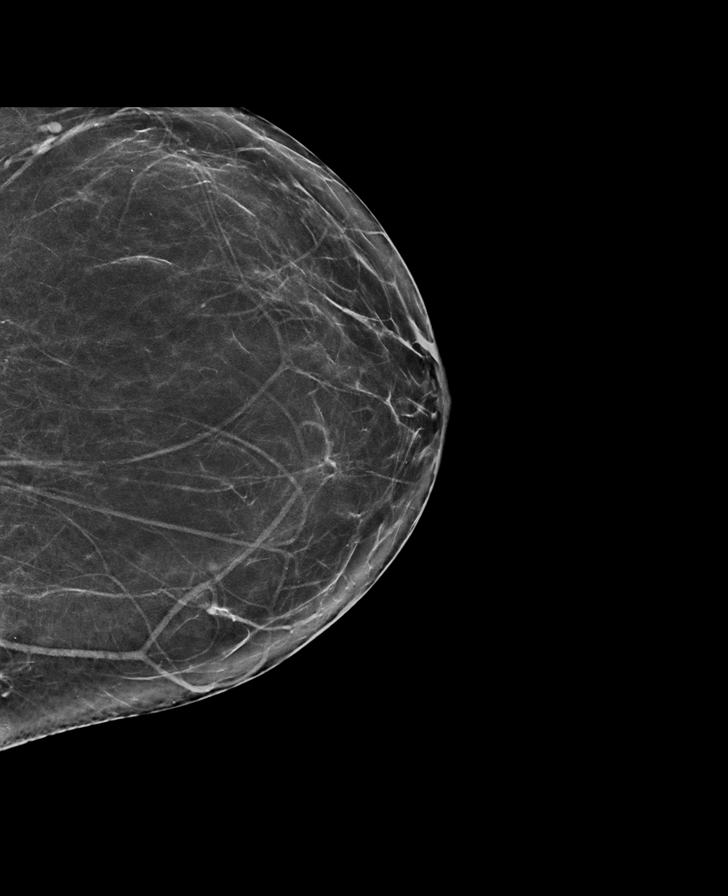

[L CC synth-2D (2 of 2)]
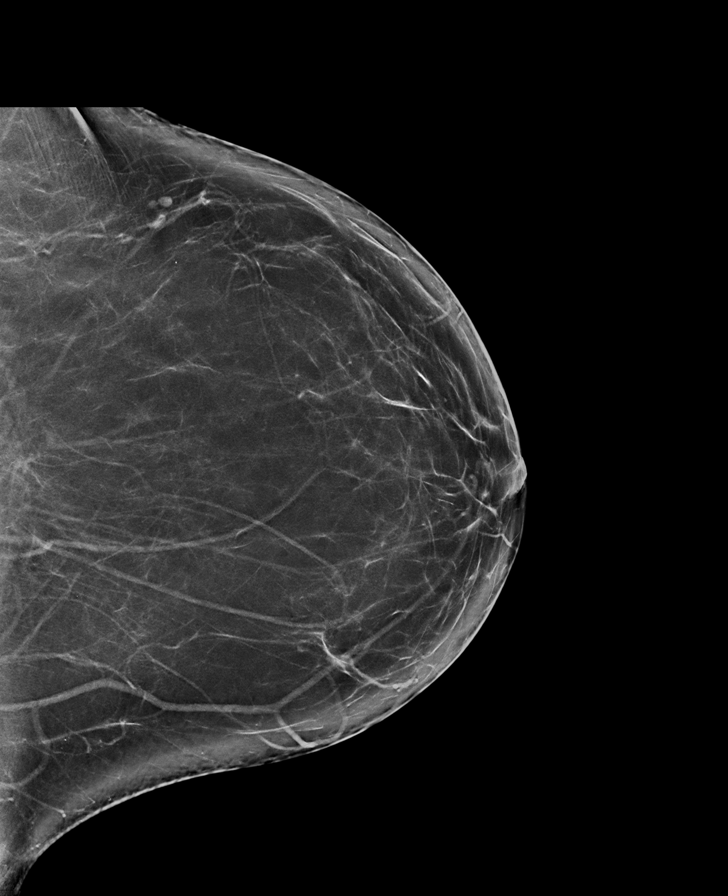

[R CC tomo · tomo slice 35/69.0]
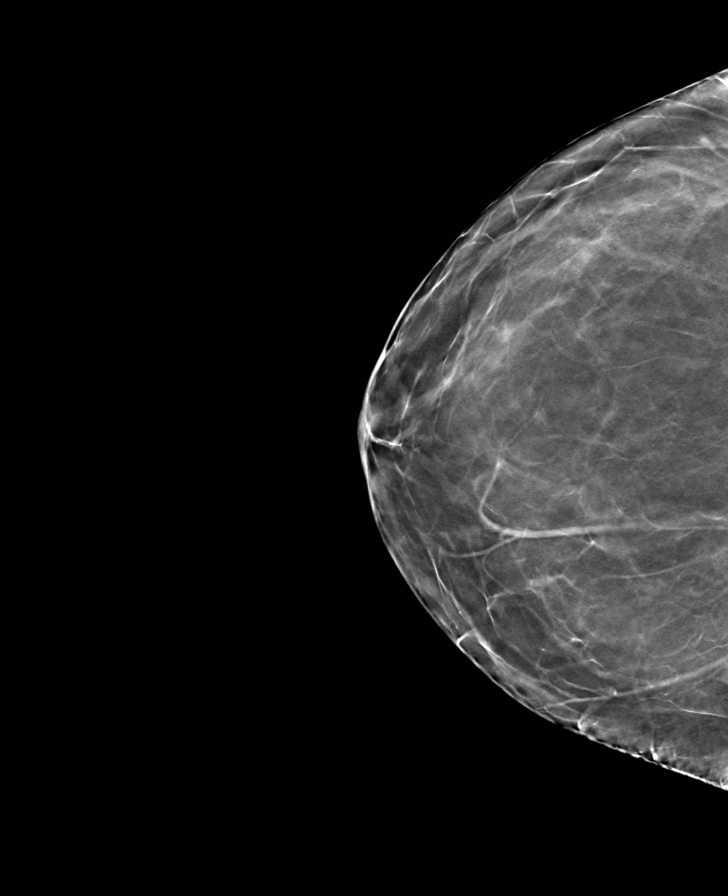

[8 of 40 positions shown; findings below may reference images not displayed]

ACR Breast Density Category b: There are scattered areas of
fibroglandular density.
FINDINGS: There are no findings suspicious for malignancy.
IMPRESSION: No mammographic evidence of malignancy. A result letter of this
screening mammogram will be mailed directly to the patient.

RECOMMENDATION:
Screening mammogram in one year. (Code:51-O-LD2)

BI-RADS CATEGORY  1: Negative.

## 2022-01-31 ENCOUNTER — Encounter: Payer: Self-pay | Admitting: Family Medicine

## 2022-01-31 LAB — LIPID PANEL
Chol/HDL Ratio: 3.5 ratio (ref 0.0–4.4)
Cholesterol, Total: 187 mg/dL (ref 100–199)
HDL: 53 mg/dL (ref >39)
LDL Chol Calc (NIH): 112 mg/dL — ABNORMAL HIGH (ref 0–99)
Triglycerides: 126 mg/dL (ref 0–149)
VLDL Cholesterol Cal: 22 mg/dL (ref 5–40)

## 2022-01-31 LAB — CBC WITH DIFFERENTIAL/PLATELET
Basophils Absolute: 0 10*3/uL (ref 0.0–0.2)
Basos: 0 %
EOS (ABSOLUTE): 0.2 10*3/uL (ref 0.0–0.4)
Eos: 2 %
Hematocrit: 47.3 % — ABNORMAL HIGH (ref 34.0–46.6)
Hemoglobin: 15.9 g/dL (ref 11.1–15.9)
Immature Grans (Abs): 0 10*3/uL (ref 0.0–0.1)
Immature Granulocytes: 0 %
Lymphocytes Absolute: 3.2 10*3/uL — ABNORMAL HIGH (ref 0.7–3.1)
Lymphs: 34 %
MCH: 27.9 pg (ref 26.6–33.0)
MCHC: 33.6 g/dL (ref 31.5–35.7)
MCV: 83 fL (ref 79–97)
Monocytes Absolute: 0.6 10*3/uL (ref 0.1–0.9)
Monocytes: 6 %
Neutrophils Absolute: 5.3 10*3/uL (ref 1.4–7.0)
Neutrophils: 58 %
Platelets: 395 10*3/uL (ref 150–450)
RBC: 5.7 x10E6/uL — ABNORMAL HIGH (ref 3.77–5.28)
RDW: 14 % (ref 11.7–15.4)
WBC: 9.3 10*3/uL (ref 3.4–10.8)

## 2022-01-31 LAB — COMPREHENSIVE METABOLIC PANEL
ALT: 16 IU/L (ref 0–32)
AST: 14 IU/L (ref 0–40)
Albumin/Globulin Ratio: 1.5 (ref 1.2–2.2)
Albumin: 4.6 g/dL (ref 3.8–4.9)
Alkaline Phosphatase: 121 IU/L (ref 44–121)
BUN/Creatinine Ratio: 14 (ref 9–23)
BUN: 12 mg/dL (ref 6–24)
Bilirubin Total: 0.3 mg/dL (ref 0.0–1.2)
CO2: 24 mmol/L (ref 20–29)
Calcium: 10.1 mg/dL (ref 8.7–10.2)
Chloride: 97 mmol/L (ref 96–106)
Creatinine, Ser: 0.86 mg/dL (ref 0.57–1.00)
Globulin, Total: 3.1 g/dL (ref 1.5–4.5)
Glucose: 140 mg/dL — ABNORMAL HIGH (ref 70–99)
Potassium: 3.8 mmol/L (ref 3.5–5.2)
Sodium: 143 mmol/L (ref 134–144)
Total Protein: 7.7 g/dL (ref 6.0–8.5)
eGFR: 82 mL/min/{1.73_m2} (ref >59)

## 2022-01-31 LAB — TSH+FREE T4
Free T4: 1.02 ng/dL (ref 0.82–1.77)
TSH: 3.44 u[IU]/mL (ref 0.450–4.500)

## 2022-01-31 LAB — MICROALBUMIN / CREATININE URINE RATIO
Creatinine, Urine: 156.3 mg/dL
Microalb/Creat Ratio: 6 mg/g creat (ref 0–29)
Microalbumin, Urine: 9.3 ug/mL

## 2022-01-31 LAB — HEMOGLOBIN A1C
Est. average glucose Bld gHb Est-mCnc: 154 mg/dL
Hgb A1c MFr Bld: 7 % — ABNORMAL HIGH (ref 4.8–5.6)

## 2022-01-31 NOTE — Progress Notes (Signed)
Normal, stable micro albumin.

## 2022-01-31 NOTE — Progress Notes (Signed)
Blood chemistry is stable; elevation remains in blood sugar. Cell count is stable; shows slight elevation in red blood cells; not previously present- could be indicative of dehydration. Consistent elevation in white cell, lymphocytes. Could refer to hematology for a second opinion or repeat cell count in 1-2 months. A1c has increased; now in diabetic range at 7% Continue to recommend balanced, lower carb meals. Smaller meal size, adding snacks. Choosing water as drink of choice and increasing purposeful exercise. Cholesterol is slightly increased; LDL is elevated at 112. Goal LDL for diabetics is 55-7- mg/dL. I recommend diet low in saturated fat and regular exercise - 30 min at least 5 times per week. Heart attack/stroke risk is stable at 4% in 10 years. The 10-year ASCVD risk score (Arnett DK, et al., 2019) is: 3.2%   Values used to calculate the score:     Age: 51 years     Sex: Female     Is Non-Hispanic African American: No     Diabetic: Yes     Tobacco smoker: No     Systolic Blood Pressure: 125 mmHg     Is BP treated: Yes     HDL Cholesterol: 53 mg/dL     Total Cholesterol: 187 mg/dL Normal thyroid. Urine pending. Jacky Kindle, FNP  [facility] [address] [place], [place] [address] [phone] (phone) [phone] (fax) [facility]

## 2022-02-02 ENCOUNTER — Encounter: Payer: Self-pay | Admitting: Family Medicine

## 2022-02-02 ENCOUNTER — Ambulatory Visit (INDEPENDENT_AMBULATORY_CARE_PROVIDER_SITE_OTHER): Payer: BC Managed Care – PPO | Admitting: Family Medicine

## 2022-02-02 VITALS — BP 110/70 | HR 88 | Temp 97.9°F | Resp 16 | Ht 66.0 in | Wt 227.8 lb

## 2022-02-02 DIAGNOSIS — Z23 Encounter for immunization: Secondary | ICD-10-CM | POA: Diagnosis not present

## 2022-02-02 DIAGNOSIS — Z0000 Encounter for general adult medical examination without abnormal findings: Secondary | ICD-10-CM | POA: Diagnosis not present

## 2022-02-02 NOTE — Progress Notes (Signed)
I,Sulibeya S Dimas,acting as a Education administrator for Gwyneth Sprout, FNP.,have documented all relevant documentation on the behalf of Gwyneth Sprout, FNP,as directed by  Gwyneth Sprout, FNP while in the presence of Gwyneth Sprout, FNP.   Complete physical exam   Patient: Tammie Bush   DOB: 1971-04-29   51 y.o. Female  MRN: [id number] Visit Date: 02/02/2022  Today's healthcare provider: Gwyneth Sprout, FNP  Re Introduced to nurse practitioner role and practice setting.  All questions answered.  Discussed provider/patient relationship and expectations.   Chief Complaint  Patient presents with   Annual Exam   Subjective    JENAFER WINTERTON is a 51 y.o. female who presents today for a complete physical exam.  She reports consuming a low sodium diet. The patient does not participate in regular exercise at present. She generally feels well. She reports sleeping fairly well. She does have additional problems to discuss today.  HPI    Past Medical History:  Diagnosis Date   Anxiety    Diabetes mellitus, type II ([place])    Dysmenorrhea    Hypertension    Hypothyroidism    Menorrhagia    Morbid obesity ([place])    Venous disease 05/27/2021   Chronic   Vertigo    Past Surgical History:  Procedure Laterality Date   ABDOMINAL HYSTERECTOMY     CHOLECYSTECTOMY  09/2017   [facility]   CRYOABLATION  08/24/2010   [facility]   HERNIA REPAIR  06/2019   [facility]  04/13/2011   menorrhagia   MASTOID DEBRIDEMENT     age 8   OVARIAN CYST REMOVAL  04/13/2011   TONSILLECTOMY  1977   TUBAL LIGATION  2001   postpartum   WISDOM TOOTH EXTRACTION     Social History   Socioeconomic History   Marital status: Legally Separated    Spouse name: Not on file   Number of children: 4   Years of education: Not on file   Highest education level: Not on file  Occupational History   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  Tobacco Use   Smoking status: Never   Smokeless tobacco: Never  Vaping Use   Vaping Use: Never used   Substance and Sexual Activity   Alcohol use: No    Alcohol/week: 0.0 standard drinks of alcohol   Drug use: No   Sexual activity: Yes    Birth control/protection: Surgical  Other Topics Concern   Not on file  Social History Narrative   Not on file   Social Determinants of Health   Financial Resource Strain: Not on file  Food Insecurity: Not on file  Transportation Needs: Not on file  Physical Activity: Inactive (01/11/2018)   Exercise Vital Sign    Days of Exercise per Week: 0 days    Minutes of Exercise per Session: 0 min  Stress: No Stress Concern Present (01/11/2018)   [employer] [place]    Feeling of Stress : Not at all  Social Connections: Not on file  Intimate Partner Violence: Not on file   Family Status  Relation Name Status   Mother  Alive   Father  Alive   Sister  (Not Specified)   MGM  (Not Specified)   MGF  (Not Specified)   Family History  Problem Relation Age of Onset   Diabetes Mother    Hypertension Mother    Breast cancer Sister 17   Heart disease Maternal Grandmother  Lung cancer Maternal Grandfather    Allergies  Allergen Reactions   Sulfamethoxazole-Trimethoprim     rash    Patient Care Team: Gwyneth Sprout, FNP as PCP - General (Family Medicine)   Medications: Outpatient Medications Prior to Visit  Medication Sig   amLODipine (NORVASC) 5 MG tablet Take 1 tablet (5 mg total) by mouth daily.   atorvastatin (LIPITOR) 20 MG tablet TAKE 1 TABLET BY MOUTH EVERY DAY   dapagliflozin propanediol (FARXIGA) 5 MG TABS tablet Take 1 tablet (5 mg total) by mouth daily.   glipiZIDE (GLUCOTROL XL) 10 MG 24 hr tablet Take 2 tablets (20 mg total) by mouth daily with breakfast.   hydrochlorothiazide (MICROZIDE) 12.5 MG capsule TAKE 1 CAPSULE BY MOUTH EVERY DAY   levothyroxine (SYNTHROID) 25 MCG tablet TAKE 1 TABLET BY MOUTH EVERY DAY   meclizine (ANTIVERT) 25 MG tablet Take 1 tablet (25 mg total)  by mouth 3 (three) times daily as needed for dizziness.   nystatin (MYCOSTATIN/NYSTOP) powder Apply 1 application topically 3 (three) times daily.   Omega-3 Fatty Acids (FISH OIL) 1200 MG CAPS Take 1 capsule by mouth daily.   oxybutynin (DITROPAN XL) 5 MG 24 hr tablet Take 4 tablets (20 mg total) by mouth at bedtime. Start with 5 mg dose, re-evaluate after 1 week, increase by 1 additional tablet. Continue until you reach comfortable relief. Max of 6 tablets per day, 30 mg.   Sennosides-Docusate Sodium (SENNA-DOCUSATE SODIUM PO) Take by mouth as needed.    omeprazole (PRILOSEC) 20 MG capsule Take 1 capsule (20 mg total) by mouth 2 (two) times daily before a meal.   No facility-administered medications prior to visit.    Review of Systems  Constitutional:  Positive for diaphoresis and fatigue.  Gastrointestinal:  Positive for constipation.  All other systems reviewed and are negative.   Last CBC Lab Results  Component Value Date   WBC 9.3 01/30/2022   HGB 15.9 01/30/2022   HCT 47.3 (H) 01/30/2022   MCV 83 01/30/2022   MCH 27.9 01/30/2022   RDW 14.0 01/30/2022   PLT 395 81/44/8185   Last metabolic panel Lab Results  Component Value Date   GLUCOSE 140 (H) 01/30/2022   NA 143 01/30/2022   K 3.8 01/30/2022   CL 97 01/30/2022   CO2 24 01/30/2022   BUN 12 01/30/2022   CREATININE 0.86 01/30/2022   EGFR 82 01/30/2022   CALCIUM 10.1 01/30/2022   PROT 7.7 01/30/2022   ALBUMIN 4.6 01/30/2022   LABGLOB 3.1 01/30/2022   AGRATIO 1.5 01/30/2022   BILITOT 0.3 01/30/2022   ALKPHOS 121 01/30/2022   AST 14 01/30/2022   ALT 16 01/30/2022   Last lipids Lab Results  Component Value Date   CHOL 187 01/30/2022   HDL 53 01/30/2022   LDLCALC 112 (H) 01/30/2022   TRIG 126 01/30/2022   CHOLHDL 3.5 01/30/2022   Last hemoglobin A1c Lab Results  Component Value Date   HGBA1C 7.0 (H) 01/30/2022   Last thyroid functions Lab Results  Component Value Date   TSH 3.440 01/30/2022   T4TOTAL  6.5 12/06/2017   Last vitamin D Lab Results  Component Value Date   VD25OH 36.2 01/04/2021   Last vitamin B12 and Folate Lab Results  Component Value Date   VITAMINB12 876 01/04/2021      Objective     BP 110/70 (BP Location: Left Arm, Patient Position: Sitting, Cuff Size: Large)   Pulse 88   Temp 97.9 F (36.6 C) (  Oral)   Resp 16   Ht '5\' 6"'  (1.676 m)   Wt 227 lb 12.8 oz (103.3 kg)   BMI 36.77 kg/m  BP Readings from Last 3 Encounters:  02/02/22 110/70  10/21/21 125/89  08/04/21 (!) 121/50   Wt Readings from Last 3 Encounters:  02/02/22 227 lb 12.8 oz (103.3 kg)  10/21/21 229 lb 8 oz (104.1 kg)  08/04/21 229 lb 6.4 oz (104.1 kg)       Physical Exam Vitals and nursing note reviewed.  Constitutional:      General: She is awake. She is not in acute distress.    Appearance: Normal appearance. She is well-developed and well-groomed. She is obese. She is not ill-appearing, toxic-appearing or diaphoretic.  HENT:     Head: Normocephalic and atraumatic.     Jaw: There is normal jaw occlusion. No trismus, tenderness, swelling or pain on movement.     Right Ear: Hearing, tympanic membrane, ear canal and external ear normal. There is no impacted cerumen.     Left Ear: Hearing, tympanic membrane, ear canal and external ear normal. There is no impacted cerumen.     Nose: Nose normal. No congestion or rhinorrhea.     Right Turbinates: Not enlarged, swollen or pale.     Left Turbinates: Not enlarged, swollen or pale.     Right Sinus: No maxillary sinus tenderness or frontal sinus tenderness.     Left Sinus: No maxillary sinus tenderness or frontal sinus tenderness.     Mouth/Throat:     Lips: Pink.     Mouth: Mucous membranes are moist. No injury.     Tongue: No lesions.     Pharynx: Oropharynx is clear. Uvula midline. No pharyngeal swelling, oropharyngeal exudate, posterior oropharyngeal erythema or uvula swelling.     Tonsils: No tonsillar exudate or tonsillar abscesses.   Eyes:     General: Lids are normal. Lids are everted, no foreign bodies appreciated. Vision grossly intact. Gaze aligned appropriately. No allergic shiner or visual field deficit.       Right eye: No discharge.        Left eye: No discharge.     Extraocular Movements: Extraocular movements intact.     Conjunctiva/sclera: Conjunctivae normal.     Right eye: Right conjunctiva is not injected. No exudate.    Left eye: Left conjunctiva is not injected. No exudate.    Pupils: Pupils are equal, round, and reactive to light.  Neck:     Thyroid: No thyroid mass, thyromegaly or thyroid tenderness.     Vascular: No carotid bruit.     Trachea: Trachea normal.  Cardiovascular:     Rate and Rhythm: Normal rate and regular rhythm.     Pulses: Normal pulses.          Carotid pulses are 2+ on the right side and 2+ on the left side.      Radial pulses are 2+ on the right side and 2+ on the left side.       Dorsalis pedis pulses are 2+ on the right side and 2+ on the left side.       Posterior tibial pulses are 2+ on the right side and 2+ on the left side.     Heart sounds: Normal heart sounds, S1 normal and S2 normal. No murmur heard.    No friction rub. No gallop.  Pulmonary:     Effort: Pulmonary effort is normal. No respiratory distress.     Breath sounds:  Normal breath sounds and air entry. No stridor. No wheezing, rhonchi or rales.  Chest:     Chest wall: No tenderness.     Comments: Breasts: risk and benefit of breast self-exam was discussed, not examined  Abdominal:     General: Abdomen is flat. Bowel sounds are normal. There is no distension.     Palpations: Abdomen is soft. There is no mass.     Tenderness: There is no abdominal tenderness. There is no right CVA tenderness, left CVA tenderness, guarding or rebound.     Hernia: No hernia is present.  Genitourinary:    Comments: Exam deferred; denies complaints Musculoskeletal:        General: No swelling, tenderness, deformity or signs  of injury. Normal range of motion.     Cervical back: Full passive range of motion without pain, normal range of motion and neck supple. No edema, rigidity or tenderness. No muscular tenderness.     Right lower leg: No edema.     Left lower leg: No edema.  Lymphadenopathy:     Cervical: No cervical adenopathy.     Right cervical: No superficial, deep or posterior cervical adenopathy.    Left cervical: No superficial, deep or posterior cervical adenopathy.  Skin:    General: Skin is warm and dry.     Capillary Refill: Capillary refill takes less than 2 seconds.     Coloration: Skin is not jaundiced or pale.     Findings: No bruising, erythema, lesion or rash.  Neurological:     General: No focal deficit present.     Mental Status: She is alert and oriented to person, place, and time. Mental status is at baseline.     GCS: GCS eye subscore is 4. GCS verbal subscore is 5. GCS motor subscore is 6.     Sensory: Sensation is intact. No sensory deficit.     Motor: Motor function is intact. No weakness.     Coordination: Coordination is intact. Coordination normal.     Gait: Gait is intact. Gait normal.  Psychiatric:        Attention and Perception: Attention and perception normal.        Mood and Affect: Mood and affect normal.        Speech: Speech normal.        Behavior: Behavior normal. Behavior is cooperative.        Thought Content: Thought content normal.        Cognition and Memory: Cognition and memory normal.        Judgment: Judgment normal.      Last depression screening scores    02/02/2022    1:31 PM 01/03/2021    2:51 PM 09/30/2020    4:50 PM  PHQ 2/9 Scores  PHQ - 2 Score 0 0 0  PHQ- 9 Score '1 2 1   ' Last fall risk screening    02/02/2022    1:30 PM  [place] in the past year? 0  Number falls in past yr: 0  Injury with Fall? 0  Risk for fall due to : No Fall Risks  Follow up Falls evaluation completed   Last Audit-C alcohol use screening     02/02/2022    1:31 PM  Alcohol Use Disorder Test (AUDIT)  1. How often do you have a drink containing alcohol? 0  2. How many drinks containing alcohol do you have on a typical day when you are drinking? 0  3. How often do you have six or more drinks on one occasion? 0  AUDIT-C Score 0   A score of 3 or more in women, and 4 or more in men indicates increased risk for alcohol abuse, EXCEPT if all of the points are from question 1   No results found for any visits on 02/02/22.  Assessment & Plan    Routine Health Maintenance and Physical Exam  Exercise Activities and Dietary recommendations  Goals   None     Immunization History  Administered Date(s) Administered   PFIZER(Purple Top)SARS-COV-2 Vaccination 04/01/2020, 05/05/2020   Tdap 02/02/2022    Health Maintenance  Topic Date Due   Hepatitis C Screening  Never done   FOOT EXAM  01/14/2020   COVID-19 Vaccine (3 - Pfizer series) 06/30/2020   Zoster Vaccines- Shingrix (1 of 2) Never done   INFLUENZA VACCINE  02/14/2022   MAMMOGRAM  02/15/2022   HEMOGLOBIN A1C  08/02/2022   OPHTHALMOLOGY EXAM  10/27/2022   PAP SMEAR-Modifier  01/23/2023   URINE MICROALBUMIN  01/31/2023   COLONOSCOPY (Pts 45-93yr Insurance coverage will need to be confirmed)  05/02/2028   TETANUS/TDAP  02/03/2032   HIV Screening  Completed   HPV VACCINES  Aged Out    Discussed health benefits of physical activity, and encouraged her to engage in regular exercise appropriate for her age and condition.   Return in about 6 months (around 08/05/2022) for chonic disease management.     IVonna Kotyk FNP, have reviewed all documentation for this visit. The documentation on 02/02/22 for the exam, diagnosis, procedures, and orders are all accurate and complete.    EGwyneth Sprout [phone](phone) [phone](fax)  [place]

## 2022-02-02 NOTE — Assessment & Plan Note (Signed)
Consented; VIS provided; repeat in 10 years or sooner if indicated

## 2022-02-02 NOTE — Assessment & Plan Note (Signed)

## 2022-02-05 ENCOUNTER — Other Ambulatory Visit: Payer: Self-pay | Admitting: Family Medicine

## 2022-02-05 DIAGNOSIS — E1159 Type 2 diabetes mellitus with other circulatory complications: Secondary | ICD-10-CM

## 2022-02-06 ENCOUNTER — Other Ambulatory Visit: Payer: Self-pay

## 2022-02-06 DIAGNOSIS — E1159 Type 2 diabetes mellitus with other circulatory complications: Secondary | ICD-10-CM

## 2022-02-06 MED ORDER — AMLODIPINE BESYLATE 5 MG PO TABS: 5.0000 mg | ORAL_TABLET | Freq: Every day | ORAL | 1 refills | Status: DC

## 2022-02-06 MED ORDER — AMLODIPINE BESYLATE 5 MG PO TABS: 5.0000 mg | ORAL_TABLET | Freq: Every day | ORAL | 0 refills | Status: DC

## 2022-02-06 NOTE — Telephone Encounter (Signed)
Tammie Bush, I have tried to send this in to the pharmacy but it keeps switching it over to print and I don't see why it is doing that.  I even put in a whole new order.  Didn't know if you could firgure it out. Thanks

## 2022-02-07 ENCOUNTER — Other Ambulatory Visit: Payer: Self-pay | Admitting: Family Medicine

## 2022-02-07 ENCOUNTER — Other Ambulatory Visit: Payer: Self-pay

## 2022-02-07 MED ORDER — AMLODIPINE BESYLATE 5 MG PO TABS: 5.0000 mg | ORAL_TABLET | Freq: Every day | ORAL | 0 refills | Status: DC

## 2022-02-07 MED ORDER — AMLODIPINE BESYLATE 5 MG PO TABS: 5.0000 mg | ORAL_TABLET | Freq: Every day | ORAL | 1 refills | Status: DC

## 2022-02-07 NOTE — Progress Notes (Signed)
Amlodipine.

## 2022-02-16 ENCOUNTER — Ambulatory Visit
Admission: RE | Admit: 2022-02-16 | Discharge: 2022-02-16 | Disposition: A | Discharge status: Home / Self Care | Payer: BC Managed Care – PPO | Source: Ambulatory Visit | Attending: Family Medicine | Admitting: Family Medicine

## 2022-02-16 DIAGNOSIS — Z1231 Encounter for screening mammogram for malignant neoplasm of breast: Secondary | ICD-10-CM | POA: Insufficient documentation

## 2022-02-17 NOTE — Progress Notes (Signed)
Hi Raiden  Normal mammogram; repeat in 1 year.  Please let us know if you have any questions.  Thank you,  Merita Norton, FNP

## 2022-02-28 ENCOUNTER — Other Ambulatory Visit: Payer: Self-pay | Admitting: Family Medicine

## 2022-03-01 NOTE — Telephone Encounter (Signed)
Refilled 01/02/2022 #[id number] refills. Requested Prescriptions  Pending Prescriptions Disp Refills  . atorvastatin (LIPITOR) 20 MG tablet [Pharmacy Med Name: ATORVASTATIN 20 MG TABLET] 90 tablet 0    Sig: TAKE 1 TABLET BY MOUTH EVERY DAY     Cardiovascular:  Antilipid - Statins Failed - 02/28/2022 10:38 PM      Failed - Lipid Panel in normal range within the last 12 months    Cholesterol, Total  Date Value Ref Range Status  01/30/2022 187 100 - 199 mg/dL Final   LDL Cholesterol (Calc)  Date Value Ref Range Status  05/07/2017 126 (H) mg/dL (calc) Final    Comment:    Reference range: <100 . Desirable range <100 mg/dL for primary prevention;   <70 mg/dL for patients with CHD or diabetic patients  with > or = 2 CHD risk factors. Marland Kitchen LDL-C is now calculated using the Martin-Hopkins  calculation, which is a validated novel method providing  better accuracy than the Friedewald equation in the  estimation of LDL-C.  Horald Pollen et al. Lenox Ahr. 7026;378(58): 2061-2068  (http://education.[facility].com/faq/FAQ164)    LDL Chol Calc (NIH)  Date Value Ref Range Status  01/30/2022 112 (H) 0 - 99 mg/dL Final   HDL  Date Value Ref Range Status  01/30/2022 53 >39 mg/dL Final   Triglycerides  Date Value Ref Range Status  01/30/2022 126 0 - 149 mg/dL Final         Passed - Patient is not pregnant      Passed - Valid encounter within last 12 months    Recent Outpatient Visits          3 weeks ago Annual physical exam   [facility] Merita Norton T, FNP   4 months ago Urinary frequency   [facility] Merita Norton T, FNP   6 months ago Type 2 diabetes mellitus with other specified complication, without long-term current use of insulin [facility])   [facility] Merita Norton T, FNP   10 months ago Recurrent AOM (acute otitis media)   [facility] Jacky Kindle, FNP   10 months ago Otitis of right ear   [facility] Malva Limes, MD      Future Appointments            In 5 months Jacky Kindle, FNP [facility], [facility]

## 2022-03-27 ENCOUNTER — Other Ambulatory Visit: Payer: Self-pay | Admitting: Family Medicine

## 2022-03-27 DIAGNOSIS — N3281 Overactive bladder: Secondary | ICD-10-CM

## 2022-04-07 ENCOUNTER — Encounter: Payer: Self-pay | Admitting: Family Medicine

## 2022-04-08 ENCOUNTER — Other Ambulatory Visit: Payer: Self-pay | Admitting: Family Medicine

## 2022-04-09 ENCOUNTER — Other Ambulatory Visit: Payer: Self-pay | Admitting: Family Medicine

## 2022-04-09 DIAGNOSIS — E1165 Type 2 diabetes mellitus with hyperglycemia: Secondary | ICD-10-CM

## 2022-04-09 DIAGNOSIS — N951 Menopausal and female climacteric states: Secondary | ICD-10-CM

## 2022-04-10 NOTE — Telephone Encounter (Signed)
Requested Prescriptions  Pending Prescriptions Disp Refills  . atorvastatin (LIPITOR) 20 MG tablet [Pharmacy Med Name: ATORVASTATIN 20 MG TABLET] 90 tablet 3    Sig: TAKE 1 TABLET BY MOUTH EVERY DAY     Cardiovascular:  Antilipid - Statins Failed - 04/08/2022 11:00 AM      Failed - Lipid Panel in normal range within the last 12 months    Cholesterol, Total  Date Value Ref Range Status  01/30/2022 187 100 - 199 mg/dL Final   LDL Cholesterol (Calc)  Date Value Ref Range Status  05/07/2017 126 (H) mg/dL (calc) Final    Comment:    Reference range: <100 . Desirable range <100 mg/dL for primary prevention;   <70 mg/dL for patients with CHD or diabetic patients  with > or = 2 CHD risk factors. Marland Kitchen LDL-C is now calculated using the Martin-Hopkins  calculation, which is a validated novel method providing  better accuracy than the Friedewald equation in the  estimation of LDL-C.  Cresenciano Genre et al. Annamaria Helling. 1740;814(48): 2061-2068  (http://education.[facility].com/faq/FAQ164)    LDL Chol Calc (NIH)  Date Value Ref Range Status  01/30/2022 112 (H) 0 - 99 mg/dL Final   HDL  Date Value Ref Range Status  01/30/2022 53 >39 mg/dL Final   Triglycerides  Date Value Ref Range Status  01/30/2022 126 0 - 149 mg/dL Final         Passed - Patient is not pregnant      Passed - Valid encounter within last 12 months    Recent Outpatient Visits          2 months ago Annual physical exam   [facility] Tally Joe T, FNP   5 months ago Urinary frequency   [facility] Tally Joe T, FNP   8 months ago Type 2 diabetes mellitus with other specified complication, without long-term current use of insulin [facility])   [facility] Tally Joe T, FNP   11 months ago Recurrent AOM (acute otitis media)   [facility] Gwyneth Sprout, FNP   11 months ago Otitis of right ear   [facility] Birdie Sons, MD      Future  Appointments            In 3 months Gwyneth Sprout, [place], [facility]

## 2022-05-16 ENCOUNTER — Encounter: Payer: Self-pay | Admitting: Family Medicine

## 2022-05-16 LAB — FSH/LH
FSH: 75.3 m[IU]/mL
LH: 53.9 m[IU]/mL

## 2022-05-16 LAB — HEMOGLOBIN A1C
Est. average glucose Bld gHb Est-mCnc: 151 mg/dL
Hgb A1c MFr Bld: 6.9 % — ABNORMAL HIGH (ref 4.8–5.6)

## 2022-05-17 NOTE — Progress Notes (Signed)
A1c has improved, slightly. Continue to show good glycemic control of Type 2 Diabetes Mellitus. Continue to recommend balanced, lower carb meals. Smaller meal size, adding snacks. Choosing water as drink of choice and increasing purposeful exercise.  Hormone levels confirm post-menopausal state with FSH of >25.  Gwyneth Sprout, [address] [place], [place] [address] [phone] (phone) [phone] (fax) [place]

## 2022-06-03 ENCOUNTER — Encounter: Payer: Self-pay | Admitting: Family Medicine

## 2022-06-13 ENCOUNTER — Ambulatory Visit: Payer: BC Managed Care – PPO | Admitting: Family Medicine

## 2022-06-14 NOTE — Progress Notes (Signed)
I,Sha'taria Tyson,acting as a Neurosurgeon for [employer], PA-C.,have documented all relevant documentation on the behalf of Debera Lat, PA-C,as directed by  [employer], PA-C while in the presence of [employer], PA-C.   Established patient visit   Patient: Tammie Bush   DOB: 07/05/1971   51 y.o. Female  MRN: [id number] Visit Date: 06/15/2022  Today's healthcare provider: Debera Lat, PA-C  CC: vaginal discomfort  Subjective    HPI  Patient is being seen today due to vaginal discomfort, dryness, pain around clitoris, and overall vaginal discomfort. Patient reports symptoms began about 2 weeks ago. Pain level at 4-5/10 states it happens really quick and eases away. Patient reports she has not tried any otc treatments. States when it first started she felt really "raw". Patient reports being sexually active. Patient had partial hysterectomy in 2012 Denies having abx recently. Has medical hx significant for diabetes. Her last A1C was 6.9 on 05/15/22  Medications: Outpatient Medications Prior to Visit  Medication Sig   amLODipine (NORVASC) 5 MG tablet Take 1 tablet (5 mg total) by mouth daily.   atorvastatin (LIPITOR) 20 MG tablet TAKE 1 TABLET BY MOUTH EVERY DAY   dapagliflozin propanediol (FARXIGA) 5 MG TABS tablet Take 1 tablet (5 mg total) by mouth daily.   glipiZIDE (GLUCOTROL XL) 10 MG 24 hr tablet Take 2 tablets (20 mg total) by mouth daily with breakfast.   hydrochlorothiazide (MICROZIDE) 12.5 MG capsule TAKE 1 CAPSULE BY MOUTH EVERY DAY   levothyroxine (SYNTHROID) 25 MCG tablet TAKE 1 TABLET BY MOUTH EVERY DAY   meclizine (ANTIVERT) 25 MG tablet Take 1 tablet (25 mg total) by mouth 3 (three) times daily as needed for dizziness.   nystatin (MYCOSTATIN/NYSTOP) powder Apply 1 application topically 3 (three) times daily.   Omega-3 Fatty Acids (FISH OIL) 1200 MG CAPS Take 1 capsule by mouth daily.   omeprazole (PRILOSEC) 20 MG capsule Take 1 capsule (20 mg total) by  mouth 2 (two) times daily before a meal.   Sennosides-Docusate Sodium (SENNA-DOCUSATE SODIUM PO) Take by mouth as needed.    No facility-administered medications prior to visit.    Review of Systems  All other systems reviewed and are negative. Except see hpi     Objective    There were no vitals taken for this visit.   Physical Exam Constitutional:      General: She is not in acute distress.    Appearance: Normal appearance. She is obese.  HENT:     Head: Normocephalic.  Pulmonary:     Effort: Pulmonary effort is normal. No respiratory distress.  Genitourinary:    Vagina: No vaginal discharge.     Comments: Vagina&labia minora erythema Neurological:     Mental Status: She is alert and oriented to person, place, and time. Mental status is at baseline.  Psychiatric:        Behavior: Behavior normal.        Thought Content: Thought content normal.        Judgment: Judgment normal.     No results found for any visits on 06/15/22.  Assessment & Plan     Vaginal discomfort Rash of genital area Labia minora erythema Has DMII, on farxiga 5 mg and glipizide 10 mg Could be due to candida vs  Irritant contact dermatitis vs atrophic vaginitis - clotrimazole (GYNE-LOTRIMIN) 1 % vaginal cream; Place 1 Applicatorful vaginally at bedtime.  Dispense: 45 g; Refill: 0 - POCT Urinalysis Dipstick pos for glucose and  blood/trace - NuSwab BV and Candida, NAA pending - Urine Microscopic pending - clotrimazole (GYNE-LOTRIMIN) 1 % vaginal cream; Place 1 Applicatorful vaginally at bedtime.  Dispense: 45 g; Refill: 0 Daily cleansing with a mild cleanser ? Use of absorbent material or clothing, such as cotton ? Application of barrier creams/zinc oxide ointment in areas that may come in contact with urine or feces Recommended clotrimazole 1 %  once- or twice-daily application for two to four weeks And  if she has pruritus: hydrocortisone 2.5%, applied once daily for one week, and every other  day for one week for pruritus Needs to be reassessed after completion of treatment for preventive measures/estrogen cream in 2 weeks.  Hematuria, unspecified type On UA - Urine Microscopic ordered for further evaluation - Urine Culture pending Will FU   The patient was advised to call back or seek an in-person evaluation if the symptoms worsen or if the condition fails to improve as anticipated.  I discussed the assessment and treatment plan with the patient. The patient was provided an opportunity to ask questions and all were answered. The patient agreed with the plan and demonstrated an understanding of the instructions.  The entirety of the information documented in the History of Present Illness, Review of Systems and Physical Exam were personally obtained by me. Portions of this information were initially documented by the CMA and reviewed by me for thoroughness and accuracy.   Debera Lat, [facility], MMS [facility] [phone] (phone) [phone] (fax)

## 2022-06-15 ENCOUNTER — Ambulatory Visit: Payer: BC Managed Care – PPO | Admitting: Physician Assistant

## 2022-06-15 ENCOUNTER — Encounter: Payer: Self-pay | Admitting: Physician Assistant

## 2022-06-15 VITALS — BP 112/75 | HR 86 | Wt 223.3 lb

## 2022-06-15 DIAGNOSIS — R21 Rash and other nonspecific skin eruption: Secondary | ICD-10-CM | POA: Diagnosis not present

## 2022-06-15 DIAGNOSIS — N949 Unspecified condition associated with female genital organs and menstrual cycle: Secondary | ICD-10-CM | POA: Diagnosis not present

## 2022-06-15 DIAGNOSIS — R319 Hematuria, unspecified: Secondary | ICD-10-CM | POA: Diagnosis not present

## 2022-06-15 LAB — POCT URINALYSIS DIPSTICK
Bilirubin, UA: NEGATIVE
Glucose, UA: POSITIVE — AB
Ketones, UA: NEGATIVE
Leukocytes, UA: NEGATIVE
Nitrite, UA: NEGATIVE
Protein, UA: NEGATIVE
Spec Grav, UA: 1.01 (ref 1.010–1.025)
Urobilinogen, UA: 0.2 E.U./dL
pH, UA: 5 (ref 5.0–8.0)

## 2022-06-15 MED ORDER — CLOTRIMAZOLE 1 % VA CREA: 1.0000 | TOPICAL_CREAM | Freq: Every day | VAGINAL | 0 refills | Status: DC

## 2022-06-16 LAB — URINALYSIS, MICROSCOPIC ONLY
Bacteria, UA: NONE SEEN
Casts: NONE SEEN /lpf
RBC, Urine: NONE SEEN /hpf (ref 0–2)
WBC, UA: NONE SEEN /hpf (ref 0–5)

## 2022-06-17 ENCOUNTER — Other Ambulatory Visit: Payer: Self-pay | Admitting: Family Medicine

## 2022-06-17 DIAGNOSIS — H8110 Benign paroxysmal vertigo, unspecified ear: Secondary | ICD-10-CM

## 2022-06-17 LAB — URINE CULTURE

## 2022-06-18 ENCOUNTER — Other Ambulatory Visit: Payer: Self-pay | Admitting: Family Medicine

## 2022-06-18 ENCOUNTER — Other Ambulatory Visit: Payer: Self-pay | Admitting: Physician Assistant

## 2022-06-18 DIAGNOSIS — K219 Gastro-esophageal reflux disease without esophagitis: Secondary | ICD-10-CM

## 2022-06-18 DIAGNOSIS — H8110 Benign paroxysmal vertigo, unspecified ear: Secondary | ICD-10-CM

## 2022-06-18 LAB — NUSWAB BV AND CANDIDA, NAA
Candida albicans, NAA: NEGATIVE
Candida glabrata, NAA: NEGATIVE

## 2022-06-19 NOTE — Progress Notes (Signed)
Please, let pt know that her lab results are back  and showed no BV or  candida, no growth of bacteria and no blood. Please, let me know if pt notices any improvement with her current treatment and what cream is she using

## 2022-06-20 MED ORDER — OMEPRAZOLE 20 MG PO CPDR: 20.0000 mg | DELAYED_RELEASE_CAPSULE | Freq: Two times a day (BID) | ORAL | 1 refills | Status: DC

## 2022-06-20 MED ORDER — MECLIZINE HCL 25 MG PO TABS: 25.0000 mg | ORAL_TABLET | Freq: Three times a day (TID) | ORAL | 0 refills | Status: DC | PRN

## 2022-07-06 ENCOUNTER — Other Ambulatory Visit: Payer: Self-pay | Admitting: Family Medicine

## 2022-07-06 DIAGNOSIS — E039 Hypothyroidism, unspecified: Secondary | ICD-10-CM

## 2022-08-07 ENCOUNTER — Other Ambulatory Visit: Payer: Self-pay | Admitting: Family Medicine

## 2022-08-07 ENCOUNTER — Encounter: Payer: Self-pay | Admitting: Family Medicine

## 2022-08-07 ENCOUNTER — Ambulatory Visit: Payer: BC Managed Care – PPO | Admitting: Family Medicine

## 2022-08-07 VITALS — BP 138/90 | HR 80 | Temp 97.9°F | Wt 224.5 lb

## 2022-08-07 DIAGNOSIS — E1165 Type 2 diabetes mellitus with hyperglycemia: Secondary | ICD-10-CM

## 2022-08-07 DIAGNOSIS — E1169 Type 2 diabetes mellitus with other specified complication: Secondary | ICD-10-CM

## 2022-08-07 DIAGNOSIS — E785 Hyperlipidemia, unspecified: Secondary | ICD-10-CM

## 2022-08-07 DIAGNOSIS — E1159 Type 2 diabetes mellitus with other circulatory complications: Secondary | ICD-10-CM

## 2022-08-07 DIAGNOSIS — I152 Hypertension secondary to endocrine disorders: Secondary | ICD-10-CM

## 2022-08-07 DIAGNOSIS — R030 Elevated blood-pressure reading, without diagnosis of hypertension: Secondary | ICD-10-CM

## 2022-08-07 DIAGNOSIS — N951 Menopausal and female climacteric states: Secondary | ICD-10-CM | POA: Insufficient documentation

## 2022-08-07 LAB — POCT GLYCOSYLATED HEMOGLOBIN (HGB A1C): Hemoglobin A1C: 6.6 % — AB (ref 4.0–5.6)

## 2022-08-07 MED ORDER — ESTRADIOL 0.1 MG/GM VA CREA: 1.0000 | TOPICAL_CREAM | Freq: Every day | VAGINAL | 12 refills | Status: DC

## 2022-08-07 NOTE — Assessment & Plan Note (Signed)
Acute on chronic, variable Trial of estrace to assist  RTC as needed

## 2022-08-07 NOTE — Assessment & Plan Note (Signed)
Chronic, stable Body mass index is 36.24 kg/m. Associated with HTN, HLD, diabetes, GERD

## 2022-08-07 NOTE — Assessment & Plan Note (Signed)
Chronic, borderline Pt endorses acute headache today Goal <130/<80 Continue DASH diet and purposeful exercise to assist  Continue Norvasc 5 mg

## 2022-08-07 NOTE — Assessment & Plan Note (Signed)
Chronic, previously elevated recommend diet low in saturated fat and regular exercise - 30 min at least 5 times per week LDL goal of <70 Repeat LP Previously on lipitor 20 mg

## 2022-08-07 NOTE — Assessment & Plan Note (Signed)
Chronic, well controlled Continue to recommend balanced, lower carb meals. Smaller meal size, adding snacks. Choosing water as drink of choice and increasing purposeful exercise. Previously on Farxiga 5 mg, Glipizide 20 mg

## 2022-08-07 NOTE — Progress Notes (Signed)
I,Connie R Striblin,acting as a Education administrator for Gwyneth Sprout, FNP.,have documented all relevant documentation on the behalf of Gwyneth Sprout, FNP,as directed by  Gwyneth Sprout, FNP while in the presence of Gwyneth Sprout, FNP.  Established patient visit  Patient: Tammie Bush   DOB: August 05, 1970   52 y.o. Female  MRN: [id number] Visit Date: 08/07/2022  Today's healthcare provider: Gwyneth Sprout, FNP  Re Introduced to nurse practitioner role and practice setting.  All questions answered.  Discussed provider/patient relationship and expectations.  Chief Complaint  Patient presents with   Follow-up   Subjective    HPI  Hypertension, follow-up  BP Readings from Last 3 Encounters:  08/07/22 (!) 138/90  06/15/22 112/75  02/02/22 110/70   Wt Readings from Last 3 Encounters:  08/07/22 224 lb 8 oz (101.8 kg)  06/15/22 223 lb 4.8 oz (101.3 kg)  02/02/22 227 lb 12.8 oz (103.3 kg)     She was last seen for hypertension 6 months ago.  BP at that visit was 110/70. Management since that visit includes continuing treatment  She reports excellent compliance with treatment. She is not having side effects.  She is following a Low Sodium diet. She is not exercising.  Use of agents associated with hypertension: none.   Outside blood pressures are not being taken.  Pertinent labs Lab Results  Component Value Date   CHOL 187 01/30/2022   HDL 53 01/30/2022   LDLCALC 112 (H) 01/30/2022   TRIG 126 01/30/2022   CHOLHDL 3.5 01/30/2022   Lab Results  Component Value Date   NA 143 01/30/2022   K 3.8 01/30/2022   CREATININE 0.86 01/30/2022   EGFR 82 01/30/2022   GLUCOSE 140 (H) 01/30/2022   TSH 3.440 01/30/2022     The 10-year ASCVD risk score (Arnett DK, et al., 2019) is: 3.9%  ---------------------------------------------------------------------------------------------------  Diabetes Mellitus Type II, Follow-up  Lab Results  Component Value Date   HGBA1C 6.6 (A) 08/07/2022    HGBA1C 6.9 (H) 05/15/2022   HGBA1C 7.0 (H) 01/30/2022   Wt Readings from Last 3 Encounters:  08/07/22 224 lb 8 oz (101.8 kg)  06/15/22 223 lb 4.8 oz (101.3 kg)  02/02/22 227 lb 12.8 oz (103.3 kg)   Last seen for diabetes 6 months ago.  Management since then includes continuing treatment . She reports excellent compliance with treatment. She is not having side effects.   Pertinent Labs: Lab Results  Component Value Date   CHOL 187 01/30/2022   HDL 53 01/30/2022   LDLCALC 112 (H) 01/30/2022   TRIG 126 01/30/2022   CHOLHDL 3.5 01/30/2022   Lab Results  Component Value Date   NA 143 01/30/2022   K 3.8 01/30/2022   CREATININE 0.86 01/30/2022   EGFR 82 01/30/2022   LABMICR 9.3 01/30/2022   MICRALBCREAT 6 01/30/2022     ---------------------------------------------------------------------------------------------------  Medications: Outpatient Medications Prior to Visit  Medication Sig   amLODipine (NORVASC) 5 MG tablet Take 1 tablet (5 mg total) by mouth daily.   atorvastatin (LIPITOR) 20 MG tablet TAKE 1 TABLET BY MOUTH EVERY DAY   levothyroxine (SYNTHROID) 25 MCG tablet TAKE 1 TABLET BY MOUTH EVERY DAY   meclizine (ANTIVERT) 25 MG tablet Take 1 tablet (25 mg total) by mouth 3 (three) times daily as needed for dizziness.   nystatin (MYCOSTATIN/NYSTOP) powder Apply 1 application topically 3 (three) times daily.   omeprazole (PRILOSEC) 20 MG capsule Take 1 capsule (20 mg total) by mouth  2 (two) times daily before a meal.   [DISCONTINUED] dapagliflozin propanediol (FARXIGA) 5 MG TABS tablet Take 1 tablet (5 mg total) by mouth daily.   [DISCONTINUED] glipiZIDE (GLUCOTROL XL) 10 MG 24 hr tablet Take 2 tablets (20 mg total) by mouth daily with breakfast.   [DISCONTINUED] hydrochlorothiazide (MICROZIDE) 12.5 MG capsule TAKE 1 CAPSULE BY MOUTH EVERY DAY   [DISCONTINUED] clotrimazole (GYNE-LOTRIMIN) 1 % vaginal cream Place 1 Applicatorful vaginally at bedtime.   [DISCONTINUED] Omega-3  Fatty Acids (FISH OIL) 1200 MG CAPS Take 1 capsule by mouth daily.   [DISCONTINUED] Sennosides-Docusate Sodium (SENNA-DOCUSATE SODIUM PO) Take by mouth as needed.    No facility-administered medications prior to visit.    Review of Systems     Objective    BP (!) 138/90 (BP Location: Left Arm, Patient Position: Sitting, Cuff Size: Normal)   Pulse 80   Temp 97.9 F (36.6 C) (Oral)   Wt 224 lb 8 oz (101.8 kg)   SpO2 99%   BMI 36.24 kg/m    Physical Exam Vitals and nursing note reviewed.  Constitutional:      General: She is not in acute distress.    Appearance: Normal appearance. She is obese. She is not ill-appearing, toxic-appearing or diaphoretic.  HENT:     Head: Normocephalic and atraumatic.  Cardiovascular:     Rate and Rhythm: Normal rate and regular rhythm.     Pulses: Normal pulses.     Heart sounds: Normal heart sounds. No murmur heard.    No friction rub. No gallop.  Pulmonary:     Effort: Pulmonary effort is normal. No respiratory distress.     Breath sounds: Normal breath sounds. No stridor. No wheezing, rhonchi or rales.  Chest:     Chest wall: No tenderness.  Genitourinary:    Comments: Reports dryness at clitoris as well as vulva irritation  Musculoskeletal:        General: No swelling, tenderness, deformity or signs of injury. Normal range of motion.     Right lower leg: No edema.     Left lower leg: No edema.  Skin:    General: Skin is warm and dry.     Capillary Refill: Capillary refill takes less than 2 seconds.     Coloration: Skin is not jaundiced or pale.     Findings: No bruising, erythema, lesion or rash.  Neurological:     General: No focal deficit present.     Mental Status: She is alert and oriented to person, place, and time. Mental status is at baseline.     Cranial Nerves: No cranial nerve deficit.     Sensory: No sensory deficit.     Motor: No weakness.     Coordination: Coordination normal.  Psychiatric:        Mood and Affect:  Mood normal.        Behavior: Behavior normal.        Thought Content: Thought content normal.        Judgment: Judgment normal.     Results for orders placed or performed in visit on 08/07/22  POCT HgB A1C  Result Value Ref Range   Hemoglobin A1C 6.6 (A) 4.0 - 5.6 %   HbA1c POC (<> result, manual entry)     HbA1c, POC (prediabetic range)     HbA1c, POC (controlled diabetic range)      Assessment & Plan     Problem List Items Addressed This Visit  Cardiovascular and Mediastinum   Hypertension associated with diabetes (HCC)    Chronic, borderline Pt endorses acute headache today Goal <130/<80 Continue DASH diet and purposeful exercise to assist  Continue Norvasc 5 mg        Endocrine   Diabetes mellitus (HCC) - Primary    Chronic, well controlled Continue to recommend balanced, lower carb meals. Smaller meal size, adding snacks. Choosing water as drink of choice and increasing purposeful exercise. Previously on Farxiga 5 mg, Glipizide 20 mg        Relevant Orders   POCT HgB A1C (Completed)   Hyperlipidemia associated with type 2 diabetes mellitus (HCC)    Chronic, previously elevated recommend diet low in saturated fat and regular exercise - 30 min at least 5 times per week LDL goal of <70 Repeat LP Previously on lipitor 20 mg       Relevant Orders   Lipid panel     Genitourinary   Vaginal dryness, menopausal    Acute on chronic, variable Trial of estrace to assist  RTC as needed      Relevant Medications   estradiol (ESTRACE VAGINAL) 0.1 MG/GM vaginal cream     Other   Morbid obesity (HCC)    Chronic, stable Body mass index is 36.24 kg/m. Associated with HTN, HLD, diabetes, GERD      Return in about 6 months (around 02/05/2023) for annual examination.     Leilani Merl, FNP, have reviewed all documentation for this visit. The documentation on 08/07/22 for the exam, diagnosis, procedures, and orders are all accurate and complete.  Jacky Kindle, FNP  [facility] [phone] (phone) [phone] (fax)  [facility]

## 2022-08-08 LAB — LIPID PANEL
Chol/HDL Ratio: 3.4 ratio (ref 0.0–4.4)
Cholesterol, Total: 172 mg/dL (ref 100–199)
HDL: 51 mg/dL (ref >39)
LDL Chol Calc (NIH): 98 mg/dL (ref 0–99)
Triglycerides: 128 mg/dL (ref 0–149)
VLDL Cholesterol Cal: 23 mg/dL (ref 5–40)

## 2022-08-08 NOTE — Progress Notes (Signed)
Cholesterol is improved; down 14 pts in LDL/bad cholesterol. Heart attack and stroke risk remains low at 4% predicted in 10 years. The 10-year ASCVD risk score (Arnett DK, et al., 2019) is: 3.7%   Values used to calculate the score:     Age: 52 years     Sex: Female     Is Non-Hispanic African American: No     Diabetic: Yes     Tobacco smoker: No     Systolic Blood Pressure: 035 mmHg     Is BP treated: Yes     HDL Cholesterol: 51 mg/dL     Total Cholesterol: 172 mg/dL

## 2022-08-14 ENCOUNTER — Encounter: Payer: Self-pay | Admitting: Family Medicine

## 2022-08-15 ENCOUNTER — Telehealth: Payer: Self-pay

## 2022-08-15 NOTE — Telephone Encounter (Signed)
Copied from [place] [id number]. Topic: General - Other >> Aug 15, 2022  2:10 PM Chapman Fitch wrote: Reason for CRM: DOS 7.17.23/ pt received [facility] bill for wellness visit lab/urine culture / this should have been coded differently and not as a wellness visit / pt sent a mychart message about this / please advise

## 2022-08-16 ENCOUNTER — Telehealth: Payer: Self-pay

## 2022-08-16 NOTE — Telephone Encounter (Signed)
Copied from [place] [id number]. Topic: General - Other >> Aug 16, 2022  4:34 PM Everette C wrote: Reason for CRM: The patient would like to be contacted by a member of staff related to the billing and coding for their urinalysis on 01/30/22  The patient was told that the labwork would need to be coded as a non wellness visit   Please contact the patient further when possible

## 2022-08-23 NOTE — Telephone Encounter (Signed)
Tammie Bush has called [facility] and asked them to re-file under dx codes.  Patient aware.

## 2022-10-18 LAB — HM DIABETES EYE EXAM

## 2022-10-23 ENCOUNTER — Encounter: Payer: Self-pay | Admitting: Family Medicine

## 2022-10-31 ENCOUNTER — Other Ambulatory Visit: Payer: Self-pay | Admitting: Family Medicine

## 2022-10-31 DIAGNOSIS — Z1231 Encounter for screening mammogram for malignant neoplasm of breast: Secondary | ICD-10-CM

## 2022-11-19 ENCOUNTER — Telehealth: Payer: BC Managed Care – PPO | Admitting: Nurse Practitioner

## 2022-11-19 DIAGNOSIS — L739 Follicular disorder, unspecified: Secondary | ICD-10-CM | POA: Diagnosis not present

## 2022-11-19 MED ORDER — MUPIROCIN 2 % EX OINT: 1.0000 | TOPICAL_OINTMENT | Freq: Two times a day (BID) | CUTANEOUS | 0 refills | Status: DC

## 2022-11-19 NOTE — Progress Notes (Signed)
  E-Visit for Folliculitis   We are sorry you are not feeling well.  Here is how we plan to help!  Based on what you have shared with me it looks like you have folliculitis.  Folliculitis refers to inflammation of the superficial or deep portio of the hair follicle.  It can be infectious or non-infectious. Various bacteria, fungi, viruses, and parasites can cause infectious folliculis  Topical mupiricin has been prescribed.    HOME CARE: Apply a warm, moist washcloth or compress using a saltwater solution (1 teaspoon of table salt to 2 cups water) Apply over the counter antibiotic cream, gel or wash  Apply soothing lotions such as oatmeal lotion or over the counter hydrocortisone cream Clean the affected skin twice daily with antibacterial soap. Use clean washcloth and towel each time and do not share with anyone.  Wash these items and clothes that have touched the area with hot soapy water. Protect the skin. If possible avoid shaving.  If you must shave, try an Neurosurgeon.  When done, rinse skin with warm water and apply moisturizer.  GET HELP RIGHT AWAY IF: You have extensive skin involvement or the symptoms return after treatment Symptoms don't go away after treatment. Severe itching that persists. If you rash spreads or swells. If you rash begins to smell. If it blisters and opens or develops a yellow-brown crust. You develop a fever. You have a sore throat. You become short of breath.  MAKE SURE YOU:  Understand these instructions. Will watch your condition. Will get help right away if you are not doing well or get worse.  Thank you for choosing an e-visit.  Your e-visit answers were reviewed by a board certified advanced clinical practitioner to complete your personal care plan. Depending upon the condition, your plan could have included both over the counter or prescription medications.  Please review your pharmacy choice. Make sure the pharmacy is open so you can  pick up prescription now. If there is a problem, you may contact your provider through [employer] and have the prescription routed to another pharmacy.  Your safety is important to [place]. If you have drug allergies check your prescription carefully.   For the next 24 hours you can use MyChart to ask questions about today's visit, request a non-urgent call back, or ask for a work or school excuse. You will get an email in the next two days asking about your experience. I hope that your e-visit has been valuable and will speed your recovery.

## 2022-11-19 NOTE — Progress Notes (Signed)
I have spent 5 minutes in review of e-visit questionnaire, review and updating patient chart, medical decision making and response to patient.  ° °Myha Arizpe W Kimiye Strathman, NP ° °  °

## 2022-12-04 ENCOUNTER — Encounter: Payer: Self-pay | Admitting: Family Medicine

## 2022-12-05 ENCOUNTER — Ambulatory Visit: Payer: BC Managed Care – PPO | Admitting: Physician Assistant

## 2022-12-05 ENCOUNTER — Encounter: Payer: Self-pay | Admitting: Physician Assistant

## 2022-12-05 VITALS — BP 147/73 | HR 92 | Temp 98.5°F | Wt 231.0 lb

## 2022-12-05 DIAGNOSIS — E1165 Type 2 diabetes mellitus with hyperglycemia: Secondary | ICD-10-CM

## 2022-12-05 DIAGNOSIS — R21 Rash and other nonspecific skin eruption: Secondary | ICD-10-CM | POA: Diagnosis not present

## 2022-12-05 DIAGNOSIS — R062 Wheezing: Secondary | ICD-10-CM

## 2022-12-05 DIAGNOSIS — R051 Acute cough: Secondary | ICD-10-CM

## 2022-12-05 LAB — POCT GLYCOSYLATED HEMOGLOBIN (HGB A1C): Hemoglobin A1C: 7.4 % — AB (ref 4.0–5.6)

## 2022-12-05 MED ORDER — HYDROXYZINE PAMOATE 25 MG PO CAPS
25.0000 mg | ORAL_CAPSULE | Freq: Three times a day (TID) | ORAL | 0 refills | Status: DC | PRN
Start: 2022-12-05 — End: 2023-02-09

## 2022-12-05 MED ORDER — PREDNISONE 20 MG PO TABS
20.0000 mg | ORAL_TABLET | Freq: Every day | ORAL | 0 refills | Status: DC
Start: 2022-12-05 — End: 2023-02-09

## 2022-12-05 MED ORDER — ALBUTEROL SULFATE HFA 108 (90 BASE) MCG/ACT IN AERS
2.0000 | INHALATION_SPRAY | Freq: Four times a day (QID) | RESPIRATORY_TRACT | 2 refills | Status: DC | PRN
Start: 2022-12-05 — End: 2023-02-09

## 2022-12-05 MED ORDER — FLUOCINONIDE EMULSIFIED BASE 0.05 % EX CREA: 1.0000 | TOPICAL_CREAM | Freq: Two times a day (BID) | CUTANEOUS | 0 refills | Status: DC

## 2022-12-05 NOTE — Progress Notes (Unsigned)
Established patient visit  Patient: Tammie Bush   DOB: Feb 11, 1971   52 y.o. Female  MRN: [id number] Visit Date: 12/05/2022  Today's healthcare provider: Debera Lat, PA-C   No chief complaint on file.  Subjective    HPI  ***     08/07/2022    3:14 PM 02/02/2022    1:31 PM 01/03/2021    2:51 PM  Depression screen PHQ 2/9  Decreased Interest 0 0 0  Down, Depressed, Hopeless 0 0 0  PHQ - 2 Score 0 0 0  Altered sleeping 0 0 1  Tired, decreased energy 1 1 1   Change in appetite 0 0 0  Feeling bad or failure about yourself  0 0 0  Trouble concentrating 0 0 0  Moving slowly or fidgety/restless 0 0 0  Suicidal thoughts 0 0 0  PHQ-9 Score 1 1 2   Difficult doing work/chores Not difficult at all Not difficult at all Not difficult at all       No data to display          Medications: Outpatient Medications Prior to Visit  Medication Sig  . amLODipine (NORVASC) 5 MG tablet Take 1 tablet (5 mg total) by mouth daily.  Marland Kitchen atorvastatin (LIPITOR) 20 MG tablet TAKE 1 TABLET BY MOUTH EVERY DAY  . estradiol (ESTRACE VAGINAL) 0.1 MG/GM vaginal cream Place 1 Applicatorful vaginally at bedtime.  Marland Kitchen FARXIGA 5 MG TABS tablet TAKE 1 TABLET (5 MG TOTAL) BY MOUTH DAILY.  Marland Kitchen glipiZIDE (GLUCOTROL XL) 10 MG 24 hr tablet TAKE 2 TABLETS BY MOUTH DAILY WITH BREAKFAST.  . hydrochlorothiazide (MICROZIDE) 12.5 MG capsule TAKE 1 CAPSULE BY MOUTH EVERY DAY  . levothyroxine (SYNTHROID) 25 MCG tablet TAKE 1 TABLET BY MOUTH EVERY DAY  . meclizine (ANTIVERT) 25 MG tablet Take 1 tablet (25 mg total) by mouth 3 (three) times daily as needed for dizziness.  . mupirocin ointment (BACTROBAN) 2 % Apply 1 Application topically 2 (two) times daily.  Marland Kitchen nystatin (MYCOSTATIN/NYSTOP) powder Apply 1 application topically 3 (three) times daily.  Marland Kitchen omeprazole (PRILOSEC) 20 MG capsule Take 1 capsule (20 mg total) by mouth 2 (two) times daily before a meal.   No facility-administered medications prior to visit.    Review  of Systems  All other systems reviewed and are negative.  Except see HPI   {Labs  Heme  Chem  Endocrine  Serology  Results Review (optional):23779}   Objective    There were no vitals taken for this visit. {Show previous vital signs (optional):23777}  Physical Exam Vitals reviewed.  Constitutional:      General: She is not in acute distress.    Appearance: Normal appearance. She is well-developed. She is not diaphoretic.  HENT:     Head: Normocephalic and atraumatic.  Eyes:     General: No scleral icterus.    Conjunctiva/sclera: Conjunctivae normal.  Neck:     Thyroid: No thyromegaly.  Cardiovascular:     Rate and Rhythm: Normal rate and regular rhythm.     Pulses: Normal pulses.     Heart sounds: Normal heart sounds. No murmur heard. Pulmonary:     Effort: Pulmonary effort is normal. No respiratory distress.     Breath sounds: Normal breath sounds. No wheezing, rhonchi or rales.  Musculoskeletal:     Cervical back: Neck supple.     Right lower leg: No edema.     Left lower leg: No edema.  Lymphadenopathy:     Cervical: No cervical adenopathy.  Skin:    General: Skin is warm and dry.     Findings: No rash.  Neurological:     Mental Status: She is alert and oriented to person, place, and time. Mental status is at baseline.  Psychiatric:        Behavior: Behavior normal.        Thought Content: Thought content normal.        Judgment: Judgment normal.     No results found for any visits on 12/05/22.  Assessment & Plan    1. Rash *** - fluocinonide-emollient (LIDEX-E) 0.05 % cream; Apply 1 Application topically 2 (two) times daily.  Dispense: 30 g; Refill: 0 - predniSONE (DELTASONE) 20 MG tablet; Take 1 tablet (20 mg total) by mouth daily with breakfast.  Dispense: 20 tablet; Refill: 0 - hydrOXYzine (VISTARIL) 25 MG capsule; Take 1 capsule (25 mg total) by mouth every 8 (eight) hours as needed.  Dispense: 30 capsule; Refill: 0  2. Acute cough 3.  Wheezing Acute, based on HPI, could be allergic vs viral URI vs sinusitis Advised symptomatic treatment with antihistamines, adequate hydration.Saline nasal spray.  Mucinex as directed.  Humidifier in bedroom. Flonase per orders.  Call or return to clinic if symptoms are not improving.   - doxycycline (VIBRAMYCIN) 100 MG capsule; Take 1 capsule (100 mg total) by mouth 2 (two) times daily.  Dispense: 20 capsule; Refill: 0 - fluticasone (FLONASE) 50 MCG/ACT nasal spray; Place 2 sprays into both nostrils daily.  Dispense: 16 g; Refill: 0    - albuterol (VENTOLIN HFA) 108 (90 Base) MCG/ACT inhaler; Inhale 2 puffs into the lungs every 6 (six) hours as needed for wheezing or shortness of breath.  Dispense: 8 g; Refill: 2  4. Type 2 diabetes mellitus with hyperglycemia, without long-term current use of insulin (HCC) Chronic and previously controlled - POCT HgB A1C 7.4 increased since the last visit 6.6 Continue taking her current medications: farxiga, glipizide, 20mg  daily Will hold prednisone and try vistaril first. Continue low carb diet and daily exercise Will Fu with PCP as scheduled.  Identify and remove offending agent: (5)[C]  Avoidance  Work modification  Scientist, product/process development creams, especially high-lipid content moisturizing creams (e.g., Keri lotion, petrolatum, coconut oil)  Topical soaks with cool tap water, [place]row solution (1:40 dilution), saline (1 tsp/pt water), or silver nitrate solution  Lukewarm water baths  Aveeno oatmeal baths  Emollients (white petrolatum, Eucerin), Gold Bond No follow-ups on file.     The patient was advised to call back or seek an in-person evaluation if the symptoms worsen or if the condition fails to improve as anticipated.  I discussed the assessment and treatment plan with the patient. The patient was provided an opportunity to ask questions and all were answered. The patient agreed with the plan and demonstrated an understanding of  the instructions.  I, Debera Lat, PA-C have reviewed all documentation for this visit. The documentation on  12/05/22 for the exam, diagnosis, procedures, and orders are all accurate and complete.  Debera Lat, [facility], MMS [facility] [phone] (phone) [phone] (fax)  [facility]

## 2022-12-31 ENCOUNTER — Other Ambulatory Visit: Payer: Self-pay | Admitting: Family Medicine

## 2022-12-31 DIAGNOSIS — E039 Hypothyroidism, unspecified: Secondary | ICD-10-CM

## 2023-01-30 ENCOUNTER — Encounter: Payer: BC Managed Care – PPO | Admitting: Family Medicine

## 2023-02-05 ENCOUNTER — Encounter: Payer: BC Managed Care – PPO | Admitting: Family Medicine

## 2023-02-08 ENCOUNTER — Encounter: Payer: Self-pay | Admitting: Family Medicine

## 2023-02-09 ENCOUNTER — Ambulatory Visit (INDEPENDENT_AMBULATORY_CARE_PROVIDER_SITE_OTHER): Payer: BC Managed Care – PPO | Admitting: Family Medicine

## 2023-02-09 ENCOUNTER — Other Ambulatory Visit (HOSPITAL_COMMUNITY)
Admission: RE | Admit: 2023-02-09 | Discharge: 2023-02-09 | Disposition: A | Discharge status: Home / Self Care | Payer: BC Managed Care – PPO | Source: Ambulatory Visit | Attending: Family Medicine | Admitting: Family Medicine

## 2023-02-09 ENCOUNTER — Encounter: Payer: Self-pay | Admitting: Family Medicine

## 2023-02-09 VITALS — BP 120/82 | HR 85 | Ht 66.0 in | Wt 235.0 lb

## 2023-02-09 DIAGNOSIS — E1165 Type 2 diabetes mellitus with hyperglycemia: Secondary | ICD-10-CM | POA: Diagnosis not present

## 2023-02-09 DIAGNOSIS — E1169 Type 2 diabetes mellitus with other specified complication: Secondary | ICD-10-CM | POA: Diagnosis not present

## 2023-02-09 DIAGNOSIS — Z124 Encounter for screening for malignant neoplasm of cervix: Secondary | ICD-10-CM | POA: Insufficient documentation

## 2023-02-09 DIAGNOSIS — E785 Hyperlipidemia, unspecified: Secondary | ICD-10-CM

## 2023-02-09 DIAGNOSIS — Z0000 Encounter for general adult medical examination without abnormal findings: Secondary | ICD-10-CM

## 2023-02-09 DIAGNOSIS — E1159 Type 2 diabetes mellitus with other circulatory complications: Secondary | ICD-10-CM | POA: Diagnosis not present

## 2023-02-09 DIAGNOSIS — Z1159 Encounter for screening for other viral diseases: Secondary | ICD-10-CM | POA: Insufficient documentation

## 2023-02-09 DIAGNOSIS — K5901 Slow transit constipation: Secondary | ICD-10-CM | POA: Insufficient documentation

## 2023-02-09 DIAGNOSIS — E039 Hypothyroidism, unspecified: Secondary | ICD-10-CM | POA: Insufficient documentation

## 2023-02-09 DIAGNOSIS — I152 Hypertension secondary to endocrine disorders: Secondary | ICD-10-CM

## 2023-02-09 NOTE — Assessment & Plan Note (Signed)
Pt request for PAP q 3 years Declines STI screening

## 2023-02-09 NOTE — Assessment & Plan Note (Signed)
Low risk screen Treatable, and curable. If left untreated Hep C can lead to cirrhosis and liver failure. Encourage routine testing; recommend repeat testing if risk factors change.  

## 2023-02-09 NOTE — Assessment & Plan Note (Signed)
Chronic, previously elevated with LDL at 98 Repeat LP LDL goal of <70 Previously maintained on lipitor 20 recommend diet low in saturated fat and regular exercise - 30 min at least 5 times per week

## 2023-02-09 NOTE — Progress Notes (Signed)
Complete physical exam  Patient: Tammie Bush   DOB: Sep 14, 1970   52 y.o. Female  MRN: [id number] Visit Date: 02/09/2023  Today's healthcare provider: Jacky Kindle, FNP  Introduced to nurse practitioner role and practice setting.  All questions answered.  Discussed provider/patient relationship and expectations.  Chief Complaint  Patient presents with   Annual Exam    Patient reports she is consuming a low salt diet and no exercise at this moment. She reports feeling well and sleeping well. She also reports at the end of June. I had severely bad constipation... Just had to drink the clear magnesium citrate to get my bowels to move and she think things are better but still have issues sometimes with bowel movements    Subjective    Tammie Bush is a 52 y.o. female who presents today for a complete physical exam.  She reports consuming a general diet. The patient does not participate in regular exercise at present. She generally feels fairly well. She reports sleeping fairly well. She does have additional problems to discuss today.  HPI HPI     Annual Exam    Additional comments: Patient reports she is consuming a low salt diet and no exercise at this moment. She reports feeling well and sleeping well. She also reports at the end of June. I had severely bad constipation... Just had to drink the clear magnesium citrate to get my bowels to move and she think things are better but still have issues sometimes with bowel movements       Last edited by Acey Lav, CMA on 02/09/2023 10:28 AM.      Past Medical History:  Diagnosis Date   Anxiety    Diabetes mellitus, type II (HCC)    Dysmenorrhea    Hypertension    Hypothyroidism    Menorrhagia    Morbid obesity (HCC)    Venous disease 05/27/2021   Chronic   Vertigo    Past Surgical History:  Procedure Laterality Date   ABDOMINAL HYSTERECTOMY     CHOLECYSTECTOMY  09/2017   [facility]   CRYOABLATION  08/24/2010   Her  [facility]   HERNIA REPAIR  06/2019   [facility]  04/13/2011   menorrhagia   MASTOID DEBRIDEMENT     age 54   OVARIAN CYST REMOVAL  04/13/2011   TONSILLECTOMY  1977   TUBAL LIGATION  2001   postpartum   WISDOM TOOTH EXTRACTION     Social History   Socioeconomic History   Marital status: Legally Separated    Spouse name: Not on file   Number of children: 4   Years of education: Not on file   Highest education level: Not on file  Occupational History   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  Tobacco Use   Smoking status: Never   Smokeless tobacco: Never  Vaping Use   Vaping status: Never Used  Substance and Sexual Activity   Alcohol use: No    Alcohol/week: 0.0 standard drinks of alcohol   Drug use: No   Sexual activity: Yes    Birth control/protection: Surgical  Other Topics Concern   Not on file  Social History Narrative   Not on file   Social Determinants of Health   Financial Resource Strain: Not on file  Food Insecurity: Not on file  Transportation Needs: Not on file  Physical Activity: Inactive (01/11/2018)   Exercise Vital Sign    Days of Exercise per Week: 0 days    Minutes of Exercise  per Session: 0 min  Stress: No Stress Concern Present (01/11/2018)   [employer] of Occupational Health - Occupational Stress Questionnaire    Feeling of Stress : Not at all  Social Connections: Not on file  Intimate Partner Violence: Not on file   Family Status  Relation Name Status   Mother  Alive   Father  Alive   Sister  (Not Specified)   MGM  (Not Specified)   MGF  (Not Specified)  No partnership data on file   Family History  Problem Relation Age of Onset   Diabetes Mother    Hypertension Mother    Breast cancer Sister 40   Heart disease Maternal Grandmother    Lung cancer Maternal Grandfather    Allergies  Allergen Reactions   Sulfamethoxazole-Trimethoprim     rash    Patient Care Team: Jacky Kindle, FNP as PCP - General (Family Medicine)    Medications: Outpatient Medications Prior to Visit  Medication Sig   amLODipine (NORVASC) 5 MG tablet Take 1 tablet (5 mg total) by mouth daily.   atorvastatin (LIPITOR) 20 MG tablet TAKE 1 TABLET BY MOUTH EVERY DAY   FARXIGA 5 MG TABS tablet TAKE 1 TABLET (5 MG TOTAL) BY MOUTH DAILY.   glipiZIDE (GLUCOTROL XL) 10 MG 24 hr tablet TAKE 2 TABLETS BY MOUTH DAILY WITH BREAKFAST.   hydrochlorothiazide (MICROZIDE) 12.5 MG capsule TAKE 1 CAPSULE BY MOUTH EVERY DAY   levothyroxine (SYNTHROID) 25 MCG tablet TAKE 1 TABLET BY MOUTH EVERY DAY   meclizine (ANTIVERT) 25 MG tablet Take 1 tablet (25 mg total) by mouth 3 (three) times daily as needed for dizziness.   [DISCONTINUED] albuterol (VENTOLIN HFA) 108 (90 Base) MCG/ACT inhaler Inhale 2 puffs into the lungs every 6 (six) hours as needed for wheezing or shortness of breath.   [DISCONTINUED] estradiol (ESTRACE VAGINAL) 0.1 MG/GM vaginal cream Place 1 Applicatorful vaginally at bedtime.   [DISCONTINUED] fluocinonide-emollient (LIDEX-E) 0.05 % cream Apply 1 Application topically 2 (two) times daily.   [DISCONTINUED] hydrOXYzine (VISTARIL) 25 MG capsule Take 1 capsule (25 mg total) by mouth every 8 (eight) hours as needed.   [DISCONTINUED] mupirocin ointment (BACTROBAN) 2 % Apply 1 Application topically 2 (two) times daily.   [DISCONTINUED] nystatin (MYCOSTATIN/NYSTOP) powder Apply 1 application topically 3 (three) times daily.   [DISCONTINUED] omeprazole (PRILOSEC) 20 MG capsule Take 1 capsule (20 mg total) by mouth 2 (two) times daily before a meal.   [DISCONTINUED] predniSONE (DELTASONE) 20 MG tablet Take 1 tablet (20 mg total) by mouth daily with breakfast.   No facility-administered medications prior to visit.    Review of Systems  Last CBC Lab Results  Component Value Date   WBC 9.3 01/30/2022   HGB 15.9 01/30/2022   HCT 47.3 (H) 01/30/2022   MCV 83 01/30/2022   MCH 27.9 01/30/2022   RDW 14.0 01/30/2022   PLT 395 01/30/2022   Last  metabolic panel Lab Results  Component Value Date   GLUCOSE 140 (H) 01/30/2022   NA 143 01/30/2022   K 3.8 01/30/2022   CL 97 01/30/2022   CO2 24 01/30/2022   BUN 12 01/30/2022   CREATININE 0.86 01/30/2022   EGFR 82 01/30/2022   CALCIUM 10.1 01/30/2022   PROT 7.7 01/30/2022   ALBUMIN 4.6 01/30/2022   LABGLOB 3.1 01/30/2022   AGRATIO 1.5 01/30/2022   BILITOT 0.3 01/30/2022   ALKPHOS 121 01/30/2022   AST 14 01/30/2022   ALT 16 01/30/2022   Last lipids  Lab Results  Component Value Date   CHOL 172 08/07/2022   HDL 51 08/07/2022   LDLCALC 98 08/07/2022   TRIG 128 08/07/2022   CHOLHDL 3.4 08/07/2022   Last hemoglobin A1c Lab Results  Component Value Date   HGBA1C 7.4 (A) 12/05/2022   Last thyroid functions Lab Results  Component Value Date   TSH 3.440 01/30/2022   T4TOTAL 6.5 12/06/2017   Last vitamin D Lab Results  Component Value Date   VD25OH 36.2 01/04/2021   Last vitamin B12 and Folate Lab Results  Component Value Date   VITAMINB12 876 01/04/2021        Objective    BP 120/82 (BP Location: Left Arm, Patient Position: Sitting, Cuff Size: Large)   Pulse 85   Ht 5\' 6"  (1.676 m)   Wt 235 lb (106.6 kg)   SpO2 97%   BMI 37.93 kg/m   BP Readings from Last 3 Encounters:  02/09/23 120/82  12/05/22 (!) 147/73  08/07/22 (!) 138/90   Wt Readings from Last 3 Encounters:  02/09/23 235 lb (106.6 kg)  12/05/22 231 lb (104.8 kg)  08/07/22 224 lb 8 oz (101.8 kg)   SpO2 Readings from Last 3 Encounters:  02/09/23 97%  12/05/22 96%  08/07/22 99%   Physical Exam Vitals and nursing note reviewed.  Constitutional:      General: She is awake. She is not in acute distress.    Appearance: Normal appearance. She is well-developed and well-groomed. She is obese. She is not ill-appearing, toxic-appearing or diaphoretic.  HENT:     Head: Normocephalic and atraumatic.     Jaw: There is normal jaw occlusion. No trismus, tenderness, swelling or pain on movement.      Right Ear: Hearing, tympanic membrane, ear canal and external ear normal. There is no impacted cerumen.     Left Ear: Hearing, tympanic membrane, ear canal and external ear normal. There is no impacted cerumen.     Nose: Nose normal. No congestion or rhinorrhea.     Right Turbinates: Not enlarged, swollen or pale.     Left Turbinates: Not enlarged, swollen or pale.     Right Sinus: No maxillary sinus tenderness or frontal sinus tenderness.     Left Sinus: No maxillary sinus tenderness or frontal sinus tenderness.     Mouth/Throat:     Lips: Pink.     Mouth: Mucous membranes are moist. No injury.     Tongue: No lesions.     Pharynx: Oropharynx is clear. Uvula midline. No pharyngeal swelling, oropharyngeal exudate, posterior oropharyngeal erythema or uvula swelling.     Tonsils: No tonsillar exudate or tonsillar abscesses.  Eyes:     General: Lids are normal. Lids are everted, no foreign bodies appreciated. Vision grossly intact. Gaze aligned appropriately. No allergic shiner or visual field deficit.       Right eye: No discharge.        Left eye: No discharge.     Extraocular Movements: Extraocular movements intact.     Conjunctiva/sclera: Conjunctivae normal.     Right eye: Right conjunctiva is not injected. No exudate.    Left eye: Left conjunctiva is not injected. No exudate.    Pupils: Pupils are equal, round, and reactive to light.  Neck:     Thyroid: No thyroid mass, thyromegaly or thyroid tenderness.     Vascular: No carotid bruit.     Trachea: Trachea normal.  Cardiovascular:     Rate and Rhythm: Normal rate and  regular rhythm.     Pulses: Normal pulses.          Carotid pulses are 2+ on the right side and 2+ on the left side.      Radial pulses are 2+ on the right side and 2+ on the left side.       Dorsalis pedis pulses are 2+ on the right side and 2+ on the left side.       Posterior tibial pulses are 2+ on the right side and 2+ on the left side.     Heart sounds: Normal  heart sounds, S1 normal and S2 normal. No murmur heard.    No friction rub. No gallop.  Pulmonary:     Effort: Pulmonary effort is normal. No respiratory distress.     Breath sounds: Normal breath sounds and air entry. No stridor. No wheezing, rhonchi or rales.  Chest:     Chest wall: No tenderness.  Abdominal:     General: Abdomen is flat. Bowel sounds are normal. There is no distension.     Palpations: Abdomen is soft. There is no mass.     Tenderness: There is no abdominal tenderness. There is no right CVA tenderness, left CVA tenderness, guarding or rebound.     Hernia: No hernia is present.  Genitourinary:    General: Normal vulva.     Exam position: Lithotomy position.     Tanner stage (genital): 5.     Vagina: Normal.     Cervix: Normal.     Uterus: Absent.      Adnexa: Right adnexa normal and left adnexa normal.     Rectum: Tenderness and external hemorrhoid present.  Musculoskeletal:        General: No swelling, tenderness, deformity or signs of injury. Normal range of motion.     Cervical back: Full passive range of motion without pain, normal range of motion and neck supple. No edema, rigidity or tenderness. No muscular tenderness.     Right lower leg: No edema.     Left lower leg: No edema.  Lymphadenopathy:     Cervical: No cervical adenopathy.     Right cervical: No superficial, deep or posterior cervical adenopathy.    Left cervical: No superficial, deep or posterior cervical adenopathy.  Skin:    General: Skin is warm and dry.     Capillary Refill: Capillary refill takes less than 2 seconds.     Coloration: Skin is not jaundiced or pale.     Findings: No bruising, erythema, lesion or rash.  Neurological:     General: No focal deficit present.     Mental Status: She is alert and oriented to person, place, and time. Mental status is at baseline.     GCS: GCS eye subscore is 4. GCS verbal subscore is 5. GCS motor subscore is 6.     Sensory: Sensation is intact. No  sensory deficit.     Motor: Motor function is intact. No weakness.     Coordination: Coordination is intact. Coordination normal.     Gait: Gait is intact. Gait normal.  Psychiatric:        Attention and Perception: Attention and perception normal.        Mood and Affect: Mood and affect normal.        Speech: Speech normal.        Behavior: Behavior normal. Behavior is cooperative.        Thought Content: Thought content normal.  Cognition and Memory: Cognition and memory normal.        Judgment: Judgment normal.    Last depression screening scores    08/07/2022    3:14 PM 02/02/2022    1:31 PM 01/03/2021    2:51 PM  PHQ 2/9 Scores  PHQ - 2 Score 0 0 0  PHQ- 9 Score 1 1 2    Last fall risk screening    08/07/2022    3:14 PM  Fall Risk   Falls in the past year? 0  Number falls in past yr: 0  Injury with Fall? 0   Last Audit-C alcohol use screening    08/07/2022    3:14 PM  Alcohol Use Disorder Test (AUDIT)  1. How often do you have a drink containing alcohol? 0  2. How many drinks containing alcohol do you have on a typical day when you are drinking? 0  3. How often do you have six or more drinks on one occasion? 0  AUDIT-C Score 0   A score of 3 or more in women, and 4 or more in men indicates increased risk for alcohol abuse, EXCEPT if all of the points are from question 1   No results found for any visits on 02/09/23.  Assessment & Plan    Routine Health Maintenance and Physical Exam  Exercise Activities and Dietary recommendations  Goals   None     Immunization History  Administered Date(s) Administered   PFIZER(Purple Top)SARS-COV-2 Vaccination 04/01/2020, 05/05/2020   Tdap 02/02/2022    Health Maintenance  Topic Date Due   Hepatitis C Screening  Never done   Zoster Vaccines- Shingrix (1 of 2) Never done   COVID-19 Vaccine (3 - 2023-24 season) 03/17/2022   Diabetic kidney evaluation - eGFR measurement  01/31/2023   Diabetic kidney evaluation -  Urine ACR  01/31/2023   PAP SMEAR-Modifier  01/23/2023   INFLUENZA VACCINE  02/15/2023   MAMMOGRAM  02/17/2023   HEMOGLOBIN A1C  06/07/2023   OPHTHALMOLOGY EXAM  10/18/2023   FOOT EXAM  02/09/2024   Colonoscopy  05/02/2028   DTaP/Tdap/Td (2 - Td or Tdap) 02/03/2032   HIV Screening  Completed   HPV VACCINES  Aged Out    Discussed health benefits of physical activity, and encouraged her to engage in regular exercise appropriate for her age and condition.  Problem List Items Addressed This Visit       Cardiovascular and Mediastinum   Hypertension associated with diabetes (HCC)    Chronic, at goal of <129/79 Continue current medications -Norvasc 5 mg, Farxiga 5 mg, hydrochlorothiazide 12.5 mg Repeat CBC and CMP and TSH       Relevant Orders   HgB A1c   Comprehensive metabolic panel   CBC with Differential/Platelet     Digestive   Slow transit constipation    Acute on chronic, s/p vacation Required dose of mag citrate to assist Continues to have every other day Bms; complicated by presence of hemorrhoids Continue to monitor weight, hydration, straining, fiber intake etc Recommend miralax or metamucil in place of stimulant laxative to assist Continue to monitor         Endocrine   Acquired hypothyroidism    Chronic, previously stable Repeat TSH panel Previously on 25 mcg synthroid       Relevant Orders   TSH + free T4   Diabetes mellitus (HCC)    Diabetes with hyperglycemia, HTN and HLD Repeat A1c Repeat urine micro Continue to recommend balanced, lower carb  meals. Smaller meal size, adding snacks. Choosing water as drink of choice and increasing purposeful exercise. Due for DM eye exam Foot exam completed Continue to recommend vaccinations On Farxiga 5 and Lipitor 20 Also on 10 mg glipizide BID       Relevant Orders   Urine microalbumin-creatinine with uACR   HgB A1c   Hyperlipidemia associated with type 2 diabetes mellitus (HCC)    Chronic, previously  elevated with LDL at 98 Repeat LP LDL goal of <70 Previously maintained on lipitor 20 recommend diet low in saturated fat and regular exercise - 30 min at least 5 times per week       Relevant Orders   HgB A1c   Lipid panel     Other   Annual physical exam - Primary    Things to do to keep yourself healthy  - Exercise at least 30-45 minutes a day, 3-4 days a week.  - Eat a low-fat diet with lots of fruits and vegetables, up to 7-9 servings per day.  - Seatbelts can save your life. Wear them always.  - Smoke detectors on every level of your home, check batteries every year.  - Eye Doctor - have an eye exam every 1-2 years  - Safe sex - if you may be exposed to STDs, use a condom.  - Alcohol -  If you drink, do it moderately, less than 2 drinks per day.  - Health Care Power of Attorney. Choose someone to speak for you if you are not able.  - Depression is common in our stressful world.If you're feeling down or losing interest in things you normally enjoy, please come in for a visit.  - Violence - If anyone is threatening or hurting you, please call immediately.       Encounter for hepatitis C screening test for low risk patient    Low risk screen Treatable, and curable. If left untreated Hep C can lead to cirrhosis and liver failure. Encourage routine testing; recommend repeat testing if risk factors change.       Relevant Orders   Hepatitis C Antibody   Morbid obesity (HCC)    Chronic, with noted weight gain In association with T2DM, HTN, HLD Discussed importance of healthy weight management Discussed diet and exercise Body mass index is 37.93 kg/m.       Pap smear for cervical cancer screening    Pt request for PAP q 3 years Declines STI screening      Relevant Orders   Cytology - PAP   Return in about 6 months (around 08/12/2023) for chonic disease management.    Leilani Merl, FNP, have reviewed all documentation for this visit. The documentation on  02/09/23 for the exam, diagnosis, procedures, and orders are all accurate and complete.  Jacky Kindle, FNP  [facility] [phone] (phone) [phone] (fax)  [facility]

## 2023-02-09 NOTE — Assessment & Plan Note (Signed)

## 2023-02-09 NOTE — Assessment & Plan Note (Signed)
Chronic, previously stable Repeat TSH panel Previously on 25 mcg synthroid

## 2023-02-09 NOTE — Assessment & Plan Note (Signed)
Chronic, at goal of <129/79 Continue current medications -Norvasc 5 mg, Farxiga 5 mg, hydrochlorothiazide 12.5 mg Repeat CBC and CMP and TSH

## 2023-02-09 NOTE — Assessment & Plan Note (Signed)
Acute on chronic, s/p vacation Required dose of mag citrate to assist Continues to have every other day Bms; complicated by presence of hemorrhoids Continue to monitor weight, hydration, straining, fiber intake etc Recommend miralax or metamucil in place of stimulant laxative to assist Continue to monitor

## 2023-02-09 NOTE — Assessment & Plan Note (Signed)
Diabetes with hyperglycemia, HTN and HLD Repeat A1c Repeat urine micro Continue to recommend balanced, lower carb meals. Smaller meal size, adding snacks. Choosing water as drink of choice and increasing purposeful exercise. Due for DM eye exam Foot exam completed Continue to recommend vaccinations On Farxiga 5 and Lipitor 20 Also on 10 mg glipizide BID

## 2023-02-09 NOTE — Assessment & Plan Note (Signed)
Chronic, with noted weight gain In association with T2DM, HTN, HLD Discussed importance of healthy weight management Discussed diet and exercise Body mass index is 37.93 kg/m.

## 2023-02-12 ENCOUNTER — Encounter: Payer: Self-pay | Admitting: Family Medicine

## 2023-02-13 ENCOUNTER — Encounter: Payer: Self-pay | Admitting: Family Medicine

## 2023-02-13 ENCOUNTER — Other Ambulatory Visit: Payer: Self-pay | Admitting: Physician Assistant

## 2023-02-13 ENCOUNTER — Other Ambulatory Visit: Payer: Self-pay | Admitting: Family Medicine

## 2023-02-13 DIAGNOSIS — R21 Rash and other nonspecific skin eruption: Secondary | ICD-10-CM

## 2023-02-13 DIAGNOSIS — R030 Elevated blood-pressure reading, without diagnosis of hypertension: Secondary | ICD-10-CM

## 2023-02-13 MED ORDER — ROSUVASTATIN CALCIUM 10 MG PO TABS: 10.0000 mg | ORAL_TABLET | Freq: Every day | ORAL | 3 refills | Status: DC

## 2023-02-13 NOTE — Progress Notes (Signed)
Diabetes is now stable at 7%. Continue to recommend balanced, lower carb meals. Smaller meal size, adding snacks. Choosing water as drink of choice and increasing purposeful exercise.  Cholesterol is elevated from 6 months ago; The 10-year ASCVD risk score (Arnett DK, et al., 2019) is: 3.7%. Despite ASCVD risk low; recommendation remains for  LDL goal of <70 with diabetes. I continue to recommend diet low in saturated fat and regular exercise - 30 min at least 5 times per week. Recommend increase in statin from 20 mg lipitor to 10 mg crestor.  All other labs stable; PAP pending.

## 2023-02-14 ENCOUNTER — Other Ambulatory Visit: Payer: Self-pay | Admitting: Family Medicine

## 2023-02-14 MED ORDER — ATORVASTATIN CALCIUM 80 MG PO TABS: 80.0000 mg | ORAL_TABLET | Freq: Every day | ORAL | 3 refills | Status: DC

## 2023-02-14 MED ORDER — EZETIMIBE 10 MG PO TABS: 10.0000 mg | ORAL_TABLET | Freq: Every day | ORAL | 3 refills | Status: DC

## 2023-02-14 NOTE — Telephone Encounter (Signed)
Pt states she does not want to take the rosuvastatin (CRESTOR) 10 MG tablet .  She doesn't .feel like this is the medication for her.she wants to go uo in  th atorvastatin if possible, if the dr thinks this could be an option. Pt has sent several MyChart messages explaining in further detail.

## 2023-02-14 NOTE — Telephone Encounter (Signed)
Requested Prescriptions  Pending Prescriptions Disp Refills   amLODipine (NORVASC) 5 MG tablet [Pharmacy Med Name: AMLODIPINE BESYLATE 5 MG TAB] 90 tablet 1    Sig: TAKE 1 TABLET (5 MG TOTAL) BY MOUTH DAILY.     Cardiovascular: Calcium Channel Blockers 2 Passed - 02/13/2023  6:25 PM      Passed - Last BP in normal range    BP Readings from Last 1 Encounters:  02/09/23 120/82         Passed - Last Heart Rate in normal range    Pulse Readings from Last 1 Encounters:  02/09/23 85         Passed - Valid encounter within last 6 months    Recent Outpatient Visits           5 days ago Annual physical exam   [facility] Merita Norton T, FNP   2 months ago Rash   [place] [facility] [place], [place], PA-C   6 months ago Type 2 diabetes mellitus with hyperglycemia, without long-term current use of insulin [facility])   [facility] Merita Norton T, FNP   8 months ago Vaginal discomfort   [facility] [place], [place], PA-C   1 year ago Annual physical exam   [facility] Merita Norton T, FNP       Future Appointments             In 6 months Jacky Kindle, FNP [facility], [facility]             atorvastatin (LIPITOR) 20 MG tablet [Pharmacy Med Name: ATORVASTATIN 20 MG TABLET] 90 tablet 3    Sig: TAKE 1 TABLET BY MOUTH EVERY DAY     Cardiovascular:  Antilipid - Statins Failed - 02/13/2023  6:25 PM      Failed - Lipid Panel in normal range within the last 12 months    Cholesterol, Total  Date Value Ref Range Status  02/09/2023 191 100 - 199 mg/dL Final   LDL Cholesterol (Calc)  Date Value Ref Range Status  05/07/2017 126 (H) mg/dL (calc) Final    Comment:    Reference range: <100 . Desirable range <100 mg/dL for primary prevention;   <70 mg/dL for patients with CHD or diabetic patients  with > or = 2 CHD risk factors. Marland Kitchen LDL-C is now  calculated using the Martin-Hopkins  calculation, which is a validated novel method providing  better accuracy than the Friedewald equation in the  estimation of LDL-C.  Horald Pollen et al. Lenox Ahr. 0865;784(69): 2061-2068  (http://education.[employer].com/faq/FAQ164)    LDL Chol Calc (NIH)  Date Value Ref Range Status  02/09/2023 116 (H) 0 - 99 mg/dL Final   HDL  Date Value Ref Range Status  02/09/2023 48 >39 mg/dL Final   Triglycerides  Date Value Ref Range Status  02/09/2023 154 (H) 0 - 149 mg/dL Final         Passed - Patient is not pregnant      Passed - Valid encounter within last 12 months    Recent Outpatient Visits           5 days ago Annual physical exam   [facility] Merita Norton T, FNP   2 months ago Rash   [place] [facility] [place], [place], PA-C   6 months ago Type 2 diabetes mellitus with hyperglycemia, without long-term current use  of insulin [facility])   [facility] Merita Norton T, FNP   8 months ago Vaginal discomfort   [facility] [place], [place], [place]   1 year ago Annual physical exam   [facility] Merita Norton T, FNP       Future Appointments             In 6 months Jacky Kindle, FNP [facility], [facility]             hydrochlorothiazide (MICROZIDE) 12.5 MG capsule [Pharmacy Med Name: HYDROCHLOROTHIAZIDE 12.5 MG CP] 90 capsule 1    Sig: TAKE 1 CAPSULE BY MOUTH EVERY DAY     Cardiovascular: Diuretics - Thiazide Passed - 02/13/2023  6:25 PM      Passed - Cr in normal range and within 180 days    Creat  Date Value Ref Range Status  05/07/2017 0.77 0.50 - 1.10 mg/dL Final   Creatinine, Ser  Date Value Ref Range Status  02/09/2023 0.84 0.57 - 1.00 mg/dL Final         Passed - K in normal range and within 180 days    Potassium  Date Value Ref Range Status  02/09/2023 4.1 3.5 - 5.2 mmol/L  Final         Passed - Na in normal range and within 180 days    Sodium  Date Value Ref Range Status  02/09/2023 143 134 - 144 mmol/L Final         Passed - Last BP in normal range    BP Readings from Last 1 Encounters:  02/09/23 120/82         Passed - Valid encounter within last 6 months    Recent Outpatient Visits           5 days ago Annual physical exam   [facility] Jacky Kindle, FNP   2 months ago Rash   [facility] [place], [place], PA-C   6 months ago Type 2 diabetes mellitus with hyperglycemia, without long-term current use of insulin [facility])   [facility] Jacky Kindle, FNP   8 months ago Vaginal discomfort    [facility] [place], [place], PA-C   1 year ago Annual physical exam   [facility] Jacky Kindle, FNP       Future Appointments             In 6 months Suzie Portela, Daryl Eastern, FNP [facility], [facility]

## 2023-02-14 NOTE — Telephone Encounter (Signed)
Dose changed 02/13/23 Requested Prescriptions  Pending Prescriptions Disp Refills   atorvastatin (LIPITOR) 20 MG tablet [Pharmacy Med Name: ATORVASTATIN 20 MG TABLET] 90 tablet 3    Sig: TAKE 1 TABLET BY MOUTH EVERY DAY     Cardiovascular:  Antilipid - Statins Failed - 02/13/2023  6:25 PM      Failed - Lipid Panel in normal range within the last 12 months    Cholesterol, Total  Date Value Ref Range Status  02/09/2023 191 100 - 199 mg/dL Final   LDL Cholesterol (Calc)  Date Value Ref Range Status  05/07/2017 126 (H) mg/dL (calc) Final    Comment:    Reference range: <100 . Desirable range <100 mg/dL for primary prevention;   <70 mg/dL for patients with CHD or diabetic patients  with > or = 2 CHD risk factors. Marland Kitchen LDL-C is now calculated using the Martin-Hopkins  calculation, which is a validated novel method providing  better accuracy than the Friedewald equation in the  estimation of LDL-C.  Horald Pollen et al. Lenox Ahr. 0272;536(64): 2061-2068  (http://education.[facility].com/faq/FAQ164)    LDL Chol Calc (NIH)  Date Value Ref Range Status  02/09/2023 116 (H) 0 - 99 mg/dL Final   HDL  Date Value Ref Range Status  02/09/2023 48 >39 mg/dL Final   Triglycerides  Date Value Ref Range Status  02/09/2023 154 (H) 0 - 149 mg/dL Final         Passed - Patient is not pregnant      Passed - Valid encounter within last 12 months    Recent Outpatient Visits           5 days ago Annual physical exam   [facility] Merita Norton T, FNP   2 months ago Rash   [facility] [place], [place], PA-C   6 months ago Type 2 diabetes mellitus with hyperglycemia, without long-term current use of insulin [facility])   [facility] Jacky Kindle, FNP   8 months ago Vaginal discomfort   [facility] [place], [place], PA-C   1 year ago Annual physical exam   [facility]  Jacky Kindle, FNP       Future Appointments             In 6 months Jacky Kindle, FNP [facility], [facility]            Signed Prescriptions Disp Refills   amLODipine (NORVASC) 5 MG tablet 90 tablet 1    Sig: TAKE 1 TABLET (5 MG TOTAL) BY MOUTH DAILY.     Cardiovascular: Calcium Channel Blockers 2 Passed - 02/13/2023  6:25 PM      Passed - Last BP in normal range    BP Readings from Last 1 Encounters:  02/09/23 120/82         Passed - Last Heart Rate in normal range    Pulse Readings from Last 1 Encounters:  02/09/23 85         Passed - Valid encounter within last 6 months    Recent Outpatient Visits           5 days ago Annual physical exam   [facility] Mer[facility] Norton T, FNP   2 months ago Rash   [facility] [facility] [place], [place], PA-C   6 months ago Type 2 diabetes mellitus with hyperglycemia, without long-term current use  of insulin [facility])   [facility] Merita Norton T, FNP   8 months ago Vaginal discomfort   [facility] [place], [place], [place]   1 year ago Annual physical exam   [facility] Merita Norton T, FNP       Future Appointments             In 6 months Jacky Kindle, FNP [place] [facility], [facility]             hydrochlorothiazide (MICROZIDE) 12.5 MG capsule 90 capsule 1    Sig: TAKE 1 CAPSULE BY MOUTH EVERY DAY     Cardiovascular: Diuretics - Thiazide Passed - 02/13/2023  6:25 PM      Passed - Cr in normal range and within 180 days    Creat  Date Value Ref Range Status  05/07/2017 0.77 0.50 - 1.10 mg/dL Final   Creatinine, Ser  Date Value Ref Range Status  02/09/2023 0.84 0.57 - 1.00 mg/dL Final         Passed - K in normal range and within 180 days    Potassium  Date Value Ref Range Status  02/09/2023 4.1 3.5 - 5.2 mmol/L Final         Passed - Na in normal range and  within 180 days    Sodium  Date Value Ref Range Status  02/09/2023 143 134 - 144 mmol/L Final         Passed - Last BP in normal range    BP Readings from Last 1 Encounters:  02/09/23 120/82         Passed - Valid encounter within last 6 months    Recent Outpatient Visits           5 days ago Annual physical exam   [facility] Jacky Kindle, FNP   2 months ago Rash   [place] [facility] [place], [place], PA-C   6 months ago Type 2 diabetes mellitus with hyperglycemia, without long-term current use of insulin [facility])   [facility] Jacky Kindle, FNP   8 months ago Vaginal discomfort   [facility] [place], [place], PA-C   1 year ago Annual physical exam   [facility] Jacky Kindle, FNP       Future Appointments             In 6 months Suzie Portela, Daryl Eastern, FNP [facility], [facility]

## 2023-02-14 NOTE — Telephone Encounter (Signed)
Requested medication (s) are due for refill today: Yes  Requested medication (s) are on the active medication list: No  Last refill:  12/05/22  Future visit scheduled: Yes  Notes to clinic:  Unable to refill per protocol, medication not assigned to the refill protocol. Medication D/C 02/09/23 by provider.     Requested Prescriptions  Pending Prescriptions Disp Refills   fluocinonide-emollient (LIDEX-E) 0.05 % cream [Pharmacy Med Name: FLUOCINONIDE-E 0.05% CREAM] 30 g 0    Sig: APPLY TO AFFECTED AREA TWICE A DAY     Off-Protocol Failed - 02/14/2023 11:03 AM      Failed - Medication not assigned to a protocol, review manually.      Passed - Valid encounter within last 12 months    Recent Outpatient Visits           5 days ago Annual physical exam   [facility] Mer[facility] Norton T, FNP   2 months ago Rash   [facility] [place], [place], PA-C   6 months ago Type 2 diabetes mellitus with hyperglycemia, without long-term current use of insulin [facility])   [place] [facility] Jacky Kindle, FNP   8 months ago Vaginal discomfort   [place] [facility] [place], [place], PA-C   1 year ago Annual physical exam   [facility] Jacky Kindle, FNP       Future Appointments             In 6 months Jacky Kindle, FNP [facility], [facility]

## 2023-02-26 ENCOUNTER — Ambulatory Visit
Admission: RE | Admit: 2023-02-26 | Discharge: 2023-02-26 | Disposition: A | Discharge status: Home / Self Care | Payer: BC Managed Care – PPO | Source: Ambulatory Visit | Attending: Family Medicine | Admitting: Family Medicine

## 2023-02-26 DIAGNOSIS — Z1231 Encounter for screening mammogram for malignant neoplasm of breast: Secondary | ICD-10-CM | POA: Diagnosis not present

## 2023-02-28 NOTE — Progress Notes (Signed)
Hi Tammie Bush  Normal mammogram; repeat in 1 year.  Please let us know if you have any questions.  Thank you,  Elise Payne, FNP 

## 2023-08-10 ENCOUNTER — Telehealth: Payer: Self-pay | Admitting: Physician Assistant

## 2023-08-10 MED ORDER — AMLODIPINE BESYLATE 5 MG PO TABS: 5.0000 mg | ORAL_TABLET | Freq: Every day | ORAL | 0 refills | Status: DC

## 2023-08-10 NOTE — Telephone Encounter (Signed)
I spoke to patient and she confirmed she will run out before her appointment on Tuesday, 08/14/23. Prescription sent to pharmacy.

## 2023-08-10 NOTE — Telephone Encounter (Signed)
[facility] for the following medications:    amLODipine (NORVASC) 5 MG tablet    Please advise

## 2023-08-14 ENCOUNTER — Ambulatory Visit: Payer: 59 | Admitting: Physician Assistant

## 2023-08-14 ENCOUNTER — Encounter: Payer: Self-pay | Admitting: Physician Assistant

## 2023-08-14 ENCOUNTER — Ambulatory Visit: Payer: BC Managed Care – PPO | Admitting: Family Medicine

## 2023-08-14 VITALS — BP 119/76 | HR 94 | Resp 16 | Wt 232.6 lb

## 2023-08-14 DIAGNOSIS — E1165 Type 2 diabetes mellitus with hyperglycemia: Secondary | ICD-10-CM

## 2023-08-14 DIAGNOSIS — E1159 Type 2 diabetes mellitus with other circulatory complications: Secondary | ICD-10-CM | POA: Diagnosis not present

## 2023-08-14 DIAGNOSIS — I152 Hypertension secondary to endocrine disorders: Secondary | ICD-10-CM

## 2023-08-14 DIAGNOSIS — Z7984 Long term (current) use of oral hypoglycemic drugs: Secondary | ICD-10-CM

## 2023-08-14 DIAGNOSIS — E785 Hyperlipidemia, unspecified: Secondary | ICD-10-CM

## 2023-08-14 DIAGNOSIS — E1169 Type 2 diabetes mellitus with other specified complication: Secondary | ICD-10-CM | POA: Diagnosis not present

## 2023-08-14 DIAGNOSIS — E039 Hypothyroidism, unspecified: Secondary | ICD-10-CM

## 2023-08-14 DIAGNOSIS — R3915 Urgency of urination: Secondary | ICD-10-CM

## 2023-08-14 LAB — POCT URINALYSIS DIPSTICK
Bilirubin, UA: NEGATIVE
Blood, UA: NEGATIVE
Glucose, UA: POSITIVE — AB
Ketones, UA: NEGATIVE
Leukocytes, UA: NEGATIVE
Nitrite, UA: NEGATIVE
Protein, UA: NEGATIVE
Spec Grav, UA: 1.01 (ref 1.010–1.025)
Urobilinogen, UA: 0.2 U/dL
pH, UA: 6 (ref 5.0–8.0)

## 2023-08-15 ENCOUNTER — Encounter: Payer: Self-pay | Admitting: Physician Assistant

## 2023-08-15 NOTE — Progress Notes (Unsigned)
Established patient visit  Patient: Tammie Bush   DOB: [birth year]   53 y.o. Female  MRN: [id number] Visit Date: 08/14/2023  Today's healthcare provider: Debera Lat, PA-C   Chief Complaint  Patient presents with   Medical Management of Chronic Issues   URI   Urinary Frequency    urgency   Subjective     Hypertension, follow-up  BP Readings from Last 3 Encounters:  08/14/23 119/76  02/09/23 120/82  12/05/22 (!) 147/73   Wt Readings from Last 3 Encounters:  08/14/23 232 lb 9.6 oz (105.5 kg)  02/09/23 235 lb (106.6 kg)  12/05/22 231 lb (104.8 kg)     She was last seen for hypertension 6 months ago.  BP at that visit was 120/82. Management since that visit includes norvasc, farxiga, hctz.  She reports fair compliance with treatment. She is not having side effects.  She is following a Low Sodium diet. She is not exercising. She does not smoke.   Symptoms: No chest pain No chest pressure  No palpitations No syncope  No dyspnea No orthopnea  No paroxysmal nocturnal dyspnea No lower extremity edema   Pertinent labs Lab Results  Component Value Date   CHOL 191 02/09/2023   HDL 48 02/09/2023   LDLCALC 116 (H) 02/09/2023   TRIG 154 (H) 02/09/2023   CHOLHDL 4.0 02/09/2023   Lab Results  Component Value Date   NA 143 02/09/2023   K 4.1 02/09/2023   CREATININE 0.84 02/09/2023   EGFR 84 02/09/2023   GLUCOSE 128 (H) 02/09/2023   TSH 2.350 02/09/2023     The 10-year ASCVD risk score (Arnett DK, et al., 2019) is: 3.7%  --------------------------------------------------------------------------------------Reports an adherence to therapy for DMII, HLD, hypothyroidism      08/14/2023    4:06 PM 08/07/2022    3:14 PM 02/02/2022    1:31 PM  Depression screen PHQ 2/9  Decreased Interest 0 0 0  Down, Depressed, Hopeless 0 0 0  PHQ - 2 Score 0 0 0  Altered sleeping  0 0  Tired, decreased energy  1 1  Change in appetite  0 0  Feeling bad or failure about yourself    0 0  Trouble concentrating  0 0  Moving slowly or fidgety/restless  0 0  Suicidal thoughts  0 0  PHQ-9 Score  1 1  Difficult doing work/chores  Not difficult at all Not difficult at all      08/14/2023    4:06 PM  GAD 7 : Generalized Anxiety Score  Nervous, Anxious, on Edge 0  Control/stop worrying 0  Worry too much - different things 0  Trouble relaxing 0  Restless 0  Easily annoyed or irritable 0  Afraid - awful might happen 0  Total GAD 7 Score 0  Anxiety Difficulty Not difficult at all    Medications: Outpatient Medications Prior to Visit  Medication Sig   amLODipine (NORVASC) 5 MG tablet Take 1 tablet (5 mg total) by mouth daily.   atorvastatin (LIPITOR) 80 MG tablet Take 1 tablet (80 mg total) by mouth daily.   FARXIGA 5 MG TABS tablet TAKE 1 TABLET (5 MG TOTAL) BY MOUTH DAILY.   glipiZIDE (GLUCOTROL XL) 10 MG 24 hr tablet TAKE 2 TABLETS BY MOUTH DAILY WITH BREAKFAST.   hydrochlorothiazide (MICROZIDE) 12.5 MG capsule TAKE 1 CAPSULE BY MOUTH EVERY DAY   levothyroxine (SYNTHROID) 25 MCG tablet TAKE 1 TABLET BY MOUTH EVERY DAY   meclizine (ANTIVERT) 25 MG tablet Take  1 tablet (25 mg total) by mouth 3 (three) times daily as needed for dizziness.   ezetimibe (ZETIA) 10 MG tablet Take 1 tablet (10 mg total) by mouth daily.   No facility-administered medications prior to visit.    Review of Systems All negative Except see HPI       Objective    BP 119/76 (BP Location: Left Arm, Patient Position: Sitting, Cuff Size: Large)   Pulse 94   Resp 16   Wt 232 lb 9.6 oz (105.5 kg)   BMI 37.54 kg/m     Physical Exam Vitals reviewed.  Constitutional:      General: She is not in acute distress.    Appearance: Normal appearance. She is well-developed. She is obese. She is not diaphoretic.  HENT:     Head: Normocephalic and atraumatic.  Eyes:     General: No scleral icterus.    Conjunctiva/sclera: Conjunctivae normal.  Neck:     Thyroid: No thyromegaly.   Cardiovascular:     Rate and Rhythm: Normal rate and regular rhythm.     Pulses: Normal pulses.     Heart sounds: Normal heart sounds. No murmur heard. Pulmonary:     Effort: Pulmonary effort is normal. No respiratory distress.     Breath sounds: Normal breath sounds. No wheezing, rhonchi or rales.  Musculoskeletal:     Cervical back: Neck supple.     Right lower leg: No edema.     Left lower leg: No edema.  Lymphadenopathy:     Cervical: No cervical adenopathy.  Skin:    General: Skin is warm and dry.     Findings: No rash.  Neurological:     Mental Status: She is alert and oriented to person, place, and time. Mental status is at baseline.  Psychiatric:        Mood and Affect: Mood normal.        Behavior: Behavior normal.      Results for orders placed or performed in visit on 08/14/23  POCT urinalysis dipstick  Result Value Ref Range   Color, UA light yellow    Clarity, UA clear    Glucose, UA Positive (A) Negative   Bilirubin, UA Negative    Ketones, UA Negative    Spec Grav, UA 1.010 1.010 - 1.025   Blood, UA Negative    pH, UA 6.0 5.0 - 8.0   Protein, UA Negative Negative   Urobilinogen, UA 0.2 0.2 or 1.0 E.U./dL   Nitrite, UA Negative    Leukocytes, UA Negative Negative   Appearance     Odor          Assessment and Plan 1. Hypertension associated with diabetes (HCC) (Primary) Chronic and stable Continue current regimen Norvasc 5, farxiga 5, hydrochlorothiazide 12.5 low salt diet ordered - Lipid panel - Hemoglobin A1c - TSH - CBC with Differential/Platelet - Comprehensive metabolic panel Will follow-up  2. Hyperlipidemia associated with type 2 diabetes mellitus (HCC) Chronic and previously unstable with ldl at 98 Continue current regimen lipitor 20, diet and exercise - Lipid panel - Hemoglobin A1c - TSH - CBC with Differential/Platelet - Comprehensive metabolic panel Will follow-up  3. Type 2 diabetes mellitus with hyperglycemia, without  long-term current use of insulin (HCC) Chronic and stable Ordered labs, kidney evaluation included Continue current regimen: farxiga 5, lipitor 20, glipizide 10 bid, low carb diet, exercise Will follow-up   4. Morbid obesity (HCC) Chronic and Body mass index is 37.54 kg/m. In associations with DMII,  HTN, HLD Weight loss of 5% of pt's current weight via healthy diet and daily exercise encouraged.  5. Acquired hypothyroidism Chronic, previously stable Ordered tsh Continue levothyroxine Will reassess  6. Urinary urgency Recent, concerns about UTI? - POCT urinalysis dipstick Will follow-up  URI With nasal congestion, runny nose Advised symptomatic treatment Will reassess   Orders Placed This Encounter  Procedures   Lipid panel    Has the patient fasted?:   Yes   Hemoglobin A1c   TSH   CBC with Differential/Platelet   Comprehensive metabolic panel    Has the patient fasted?:   Yes   POCT urinalysis dipstick    Return for chronic disease f/u.   The patient was advised to call back or seek an in-person evaluation if the symptoms worsen or if the condition fails to improve as anticipated.  I discussed the assessment and treatment plan with the patient. The patient was provided an opportunity to ask questions and all were answered. The patient agreed with the plan and demonstrated an understanding of the instructions.  I, Debera Lat, PA-C have reviewed all documentation for this visit. The documentation on 08/14/2023  for the exam, diagnosis, procedures, and orders are all accurate and complete.  Debera Lat, [facility], MMS [facility] [phone] (phone) [phone] (fax)  [facility]

## 2023-08-21 ENCOUNTER — Encounter: Payer: Self-pay | Admitting: Physician Assistant

## 2023-08-21 DIAGNOSIS — E1165 Type 2 diabetes mellitus with hyperglycemia: Secondary | ICD-10-CM

## 2023-08-21 LAB — CBC WITH DIFFERENTIAL/PLATELET
Basophils Absolute: 0.1 10*3/uL (ref 0.0–0.2)
Basos: 1 %
EOS (ABSOLUTE): 0.4 10*3/uL (ref 0.0–0.4)
Eos: 4 %
Hematocrit: 47 % — ABNORMAL HIGH (ref 34.0–46.6)
Hemoglobin: 15.2 g/dL (ref 11.1–15.9)
Immature Grans (Abs): 0.1 10*3/uL (ref 0.0–0.1)
Immature Granulocytes: 1 %
Lymphocytes Absolute: 3.7 10*3/uL — ABNORMAL HIGH (ref 0.7–3.1)
Lymphs: 36 %
MCH: 27.8 pg (ref 26.6–33.0)
MCHC: 32.3 g/dL (ref 31.5–35.7)
MCV: 86 fL (ref 79–97)
Monocytes Absolute: 0.6 10*3/uL (ref 0.1–0.9)
Monocytes: 5 %
Neutrophils Absolute: 5.6 10*3/uL (ref 1.4–7.0)
Neutrophils: 53 %
Platelets: 396 10*3/uL (ref 150–450)
RBC: 5.47 x10E6/uL — ABNORMAL HIGH (ref 3.77–5.28)
RDW: 13.8 % (ref 11.7–15.4)
WBC: 10.4 10*3/uL (ref 3.4–10.8)

## 2023-08-21 LAB — LIPID PANEL
Chol/HDL Ratio: 3.5 {ratio} (ref 0.0–4.4)
Cholesterol, Total: 170 mg/dL (ref 100–199)
HDL: 48 mg/dL (ref >39)
LDL Chol Calc (NIH): 101 mg/dL — ABNORMAL HIGH (ref 0–99)
Triglycerides: 116 mg/dL (ref 0–149)
VLDL Cholesterol Cal: 21 mg/dL (ref 5–40)

## 2023-08-21 LAB — COMPREHENSIVE METABOLIC PANEL
ALT: 19 [IU]/L (ref 0–32)
AST: 17 [IU]/L (ref 0–40)
Albumin: 4.5 g/dL (ref 3.8–4.9)
Alkaline Phosphatase: 128 [IU]/L — ABNORMAL HIGH (ref 44–121)
BUN/Creatinine Ratio: 20 (ref 9–23)
BUN: 16 mg/dL (ref 6–24)
Bilirubin Total: 0.3 mg/dL (ref 0.0–1.2)
CO2: 24 mmol/L (ref 20–29)
Calcium: 10 mg/dL (ref 8.7–10.2)
Chloride: 97 mmol/L (ref 96–106)
Creatinine, Ser: 0.8 mg/dL (ref 0.57–1.00)
Globulin, Total: 2.9 g/dL (ref 1.5–4.5)
Glucose: 149 mg/dL — ABNORMAL HIGH (ref 70–99)
Potassium: 3.6 mmol/L (ref 3.5–5.2)
Sodium: 139 mmol/L (ref 134–144)
Total Protein: 7.4 g/dL (ref 6.0–8.5)
eGFR: 89 mL/min/{1.73_m2} (ref >59)

## 2023-08-21 LAB — HEMOGLOBIN A1C
Est. average glucose Bld gHb Est-mCnc: 177 mg/dL
Hgb A1c MFr Bld: 7.8 % — ABNORMAL HIGH (ref 4.8–5.6)

## 2023-08-21 LAB — TSH: TSH: 3.01 u[IU]/mL (ref 0.450–4.500)

## 2023-08-22 ENCOUNTER — Encounter: Payer: Self-pay | Admitting: Physician Assistant

## 2023-08-23 ENCOUNTER — Encounter: Payer: Self-pay | Admitting: Physician Assistant

## 2023-08-23 ENCOUNTER — Other Ambulatory Visit: Payer: Self-pay | Admitting: Physician Assistant

## 2023-08-23 DIAGNOSIS — E1169 Type 2 diabetes mellitus with other specified complication: Secondary | ICD-10-CM

## 2023-08-23 DIAGNOSIS — R21 Rash and other nonspecific skin eruption: Secondary | ICD-10-CM

## 2023-08-23 MED ORDER — RYBELSUS 3 MG PO TABS: 3.0000 mg | ORAL_TABLET | Freq: Every day | ORAL | 1 refills | Status: DC

## 2023-08-24 ENCOUNTER — Other Ambulatory Visit: Payer: Self-pay | Admitting: Physician Assistant

## 2023-08-24 DIAGNOSIS — E1165 Type 2 diabetes mellitus with hyperglycemia: Secondary | ICD-10-CM

## 2023-08-24 DIAGNOSIS — R030 Elevated blood-pressure reading, without diagnosis of hypertension: Secondary | ICD-10-CM

## 2023-08-24 DIAGNOSIS — E039 Hypothyroidism, unspecified: Secondary | ICD-10-CM

## 2023-08-24 MED ORDER — HYDROCHLOROTHIAZIDE 12.5 MG PO CAPS: 12.5000 mg | ORAL_CAPSULE | Freq: Every day | ORAL | 1 refills | Status: DC

## 2023-08-24 MED ORDER — DAPAGLIFLOZIN PROPANEDIOL 5 MG PO TABS: 5.0000 mg | ORAL_TABLET | Freq: Every day | ORAL | 3 refills | Status: DC

## 2023-08-24 MED ORDER — GLIPIZIDE ER 10 MG PO TB24: 20.0000 mg | ORAL_TABLET | Freq: Every day | ORAL | 3 refills | Status: DC

## 2023-08-24 MED ORDER — LEVOTHYROXINE SODIUM 25 MCG PO TABS: 25.0000 ug | ORAL_TABLET | Freq: Every day | ORAL | 1 refills | Status: DC

## 2023-08-24 NOTE — Telephone Encounter (Signed)
 Fluocinonide  D/C 02/09/23. Amlodipine  refilled 08/10/23 # 90. Requested Prescriptions  Refused Prescriptions Disp Refills   fluocinonide -emollient (LIDEX -E) 0.05 % cream [Pharmacy Med Name: FLUOCINONIDE -E 0.05% CREAM] 30 g 0    Sig: APPLY TO AFFECTED AREA TWICE A DAY     Off-Protocol Failed - 08/24/2023  1:13 PM      Failed - Medication not assigned to a protocol, review manually.      Passed - Valid encounter within last 12 months    Recent Outpatient Visits           1 week ago Hypertension associated with diabetes [facility])   [place] [facility] [place], [place], PA-C   6 months ago Annual physical exam   [facility] Emilio Kelly DASEN, FNP   8 months ago Rash   [place] [facility] [place], [place], PA-C   1 year ago Type 2 diabetes mellitus with hyperglycemia, without long-term current use of insulin [facility])   [place] [facility] Emilio Kelly T, FNP   1 year ago Vaginal discomfort   [place] [place], [place], PA-C       Future Appointments             In 3 weeks Ostwalt, Janna, PA-C [facility], [facility]             amLODipine  (NORVASC ) 5 MG tablet [Pharmacy Med Name: AMLODIPINE  BESYLATE 5 MG TAB] 90 tablet 0    Sig: TAKE 1 TABLET (5 MG TOTAL) BY MOUTH DAILY.     Cardiovascular: Calcium  Channel Blockers 2 Passed - 08/24/2023  1:13 PM      Passed - Last BP in normal range    BP Readings from Last 1 Encounters:  08/14/23 119/76         Passed - Last Heart Rate in normal range    Pulse Readings from Last 1 Encounters:  08/14/23 94         Passed - Valid encounter within last 6 months    Recent Outpatient Visits           1 week ago Hypertension associated with diabetes [facility])   [place] [facility] [place], [place], PA-C   6 months ago Annual physical exam   [facility] Emilio Kelly DASEN, FNP   8 months  ago Rash   [place] [facility] [place], [place], PA-C   1 year ago Type 2 diabetes mellitus with hyperglycemia, without long-term current use of insulin [facility])   [place] [facility] Emilio Kelly T, FNP   1 year ago Vaginal discomfort   [place] [place], [place], PA-C       Future Appointments             In 3 weeks Ostwalt, Janna, PA-C  [facility] [place]

## 2023-08-24 NOTE — Telephone Encounter (Signed)
 [facility] hydrochlorothiazide  (MICROZIDE ) 12.5 MG capsule   & FARXIGA  5 MG TABS tablet   & ESTRADIOL  0.01% CREAM &  glipiZIDE  (GLUCOTROL  XL) 10 MG 24 hr tablet & levothyroxine  (SYNTHROID ) 25 MCG tablet   Please advise

## 2023-08-26 ENCOUNTER — Encounter: Payer: Self-pay | Admitting: Physician Assistant

## 2023-08-28 ENCOUNTER — Telehealth: Payer: Self-pay | Admitting: Physician Assistant

## 2023-08-28 MED ORDER — ATORVASTATIN CALCIUM 80 MG PO TABS: 80.0000 mg | ORAL_TABLET | Freq: Every day | ORAL | 3 refills | Status: DC

## 2023-08-28 NOTE — Telephone Encounter (Signed)
I called patient regarding a refill request in a separate encounter.   She asked me about this medication, Rybelsus, and if there were any concerns taking the three medications together(Rybelsus, Glipizide and Levothyroxine). She did take it today for first time and reports a mild stomachache and feeling a little "weird" but understands this may mean that she needs to adjust to the medication.  Please respond to her MyChart message to confirm if it is okay to take all three medications together.  She is not opposed if the provider states it is okay.

## 2023-08-28 NOTE — Telephone Encounter (Signed)
We are showing this medication was discontinued  Discontinued by: Jacky Kindle, FNP on 02/09/2023 11:39  Reason: Patient Preference     I called patient and she confirms that she does NOT need this medication refilled.

## 2023-08-28 NOTE — Telephone Encounter (Signed)
[facility] for the following medications:   estradiol (ESTRACE VAGINAL) 0.1 MG/GM vaginal cream     Please advise.

## 2023-09-12 ENCOUNTER — Encounter: Payer: Self-pay | Admitting: Physician Assistant

## 2023-09-12 DIAGNOSIS — E1165 Type 2 diabetes mellitus with hyperglycemia: Secondary | ICD-10-CM

## 2023-09-13 ENCOUNTER — Ambulatory Visit: Payer: Self-pay | Admitting: Physician Assistant

## 2023-09-13 NOTE — Telephone Encounter (Signed)
 Chief Complaint: constipation Symptoms: pain in rectum, no BM for 4 days Frequency: ongoing issues that has gotten worse, 2-3 weeks Pertinent Negatives: Patient denies fever, N/V,  Disposition: [] ED /[] Urgent Care (no appt availability in office) / [] Appointment(In office/virtual)/ []  [facility]/ [x] Home Care/ [] Refused Recommended Disposition /[] [facility]/ []  Follow-up with PCP  Additional Notes: Taking Rybelsus, started 2-3 weeks  ago, taking one a day on an empty stomach.  Pt has been taking ducolax and was having some results. Pt states that she has not had any BM for about 4 days. Pt states she has approx 1 BM per day. Pt states she is also using stool softeners.  Pt states she feels like bottom is full. Denies having a SBO. Pt states she has never had to use an enema. Pt inquiring about mag citrate. Pt still passing gas. Pt advised increase activity to 15 min/day, increase fluid, increase fiber. Sitz bath recommended. Pt states she has rectal pain, and occasional abd pain.  Copied from CRM [id number]. Topic: Clinical - Red Word Triage >> Sep 13, 2023  8:42 AM Franchot Heidelberg wrote: Red Word that prompted transfer to Nurse Triage: Abdominal pain due to severe constipation since Sunday Reason for Disposition  MILD constipation  Answer Assessment - Initial Assessment Questions 1. STOOL PATTERN OR FREQUENCY: "How often do you have a bowel movement (BM)?"  (Normal range: 3 times a day to every 3 days)  "When was your last BM?"       4 days ago, use to go once per day 2. STRAINING: "Do you have to strain to have a BM?"      Try not too, 3. RECTAL PAIN: "Does your rectum hurt when the stool comes out?" If Yes, ask: "Do you have hemorrhoids? How bad is the pain?"  (Scale 1-10; or mild, moderate, severe)     7 4. STOOL COMPOSITION: "Are the stools hard?"      hard 5. BLOOD ON STOOLS: "Has there been any blood on the toilet tissue or on the surface of the BM?" If Yes, ask:  "When was the last time?"     denies 6. CHRONIC CONSTIPATION: "Is this a new problem for you?"  If No, ask: "How long have you had this problem?" (days, weeks, months)      Has had constipation in the past, resolves with diet changes 7. CHANGES IN DIET OR HYDRATION: "Have there been any recent changes in your diet?" "How much fluids are you drinking on a daily basis?"  "How much have you had to drink today?"     New rx, rybelsus 8. MEDICINES: "Have you been taking any new medicines?" "Are you taking any narcotic pain medicines?" (e.g., Dilaudid, morphine, Percocet, Vicodin)     rybelsus 9. LAXATIVES: "Have you been using any stool softeners, laxatives, or enemas?"  If Yes, ask "What, how often, and when was the last time?"     Using stool softeners and laxatives 10. ACTIVITY:  "How much walking do you do every day?"  "Has your activity level decreased in the past week?"        denies 11. CAUSE: "What do you think is causing the constipation?"        The new rx 12. OTHER SYMPTOMS: "Do you have any other symptoms?" (e.g., abdomen pain, bloating, fever, vomiting)       Some abd pain, passing gas, denies fever, denies vomiting 13. MEDICAL HISTORY: "Do you have a history of hemorrhoids,  rectal fissures, or rectal surgery or rectal abscess?"         denies  Protocols used: Constipation-A-AH

## 2023-09-14 ENCOUNTER — Encounter: Payer: Self-pay | Admitting: Physician Assistant

## 2023-09-14 ENCOUNTER — Ambulatory Visit: Payer: Self-pay | Admitting: Physician Assistant

## 2023-09-17 ENCOUNTER — Encounter: Payer: Self-pay | Admitting: Physician Assistant

## 2023-09-20 ENCOUNTER — Telehealth: Payer: Self-pay | Admitting: Physician Assistant

## 2023-09-20 ENCOUNTER — Encounter: Payer: Self-pay | Admitting: Physician Assistant

## 2023-09-20 MED ORDER — TIRZEPATIDE 2.5 MG/0.5ML ~~LOC~~ SOAJ: 2.5000 mg | SUBCUTANEOUS | 1 refills | Status: DC

## 2023-09-20 NOTE — Telephone Encounter (Signed)
 Please see message below: pt is asking about alternate diabetic medications. Routing to clinic for follow up.  Copied from CRM [id number]. Topic: Clinical - Prescription Issue >> Sep 20, 2023  3:41 PM Alcus Dad wrote: Reason for CRM: Patient is calling in about a medication and Dr told patient to stop it because of constipation. Patient is wanting to know what do you want her to take. She is a diabetic and needs to have some type of medication.

## 2023-09-21 ENCOUNTER — Encounter: Payer: Self-pay | Admitting: Physician Assistant

## 2023-09-30 NOTE — Progress Notes (Unsigned)
 Established patient visit  Patient: Tammie Bush   DOB: Dec 17, 1970   52 y.o. Female  MRN: [id number] Visit Date: 10/02/2023  Today's healthcare provider: Debera Lat, PA-C   No chief complaint on file.  Subjective       Discussed the use of AI scribe software for clinical note transcription with the patient, who gave verbal consent to proceed.  History of Present Illness               08/14/2023    4:06 PM 08/07/2022    3:14 PM 02/02/2022    1:31 PM  Depression screen PHQ 2/9  Decreased Interest 0 0 0  Down, Depressed, Hopeless 0 0 0  PHQ - 2 Score 0 0 0  Altered sleeping  0 0  Tired, decreased energy  1 1  Change in appetite  0 0  Feeling bad or failure about yourself   0 0  Trouble concentrating  0 0  Moving slowly or fidgety/restless  0 0  Suicidal thoughts  0 0  PHQ-9 Score  1 1  Difficult doing work/chores  Not difficult at all Not difficult at all      08/14/2023    4:06 PM  GAD 7 : Generalized Anxiety Score  Nervous, Anxious, on Edge 0  Control/stop worrying 0  Worry too much - different things 0  Trouble relaxing 0  Restless 0  Easily annoyed or irritable 0  Afraid - awful might happen 0  Total GAD 7 Score 0  Anxiety Difficulty Not difficult at all    Medications: Outpatient Medications Prior to Visit  Medication Sig  . amLODipine (NORVASC) 5 MG tablet Take 1 tablet (5 mg total) by mouth daily.  Marland Kitchen atorvastatin (LIPITOR) 80 MG tablet Take 1 tablet (80 mg total) by mouth daily.  . dapagliflozin propanediol (FARXIGA) 5 MG TABS tablet Take 1 tablet (5 mg total) by mouth daily.  Marland Kitchen glipiZIDE (GLUCOTROL XL) 10 MG 24 hr tablet Take 2 tablets (20 mg total) by mouth daily with breakfast.  . hydrochlorothiazide (MICROZIDE) 12.5 MG capsule Take 1 capsule (12.5 mg total) by mouth daily.  Marland Kitchen levothyroxine (SYNTHROID) 25 MCG tablet Take 1 tablet (25 mcg total) by mouth daily.  . meclizine (ANTIVERT) 25 MG tablet Take 1 tablet (25 mg total) by mouth 3 (three)  times daily as needed for dizziness.  . tirzepatide (MOUNJARO) 2.5 MG/0.5ML Pen Inject 2.5 mg into the skin once a week.   No facility-administered medications prior to visit.    Review of Systems  All other systems reviewed and are negative. All negative Except see HPI   {Insert previous labs (optional):23779} {See past labs  Heme  Chem  Endocrine  Serology  Results Review (optional):1}   Objective    There were no vitals taken for this visit. {Insert last BP/Wt (optional):23777}{See vitals history (optional):1}   Physical Exam Vitals reviewed.  Constitutional:      General: She is not in acute distress.    Appearance: Normal appearance. She is well-developed. She is not diaphoretic.  HENT:     Head: Normocephalic and atraumatic.  Eyes:     General: No scleral icterus.    Conjunctiva/sclera: Conjunctivae normal.  Neck:     Thyroid: No thyromegaly.  Cardiovascular:     Rate and Rhythm: Normal rate and regular rhythm.     Pulses: Normal pulses.     Heart sounds: Normal heart sounds. No murmur heard. Pulmonary:     Effort: Pulmonary effort  is normal. No respiratory distress.     Breath sounds: Normal breath sounds. No wheezing, rhonchi or rales.  Musculoskeletal:     Cervical back: Neck supple.     Right lower leg: No edema.     Left lower leg: No edema.  Lymphadenopathy:     Cervical: No cervical adenopathy.  Skin:    General: Skin is warm and dry.     Findings: No rash.  Neurological:     Mental Status: She is alert and oriented to person, place, and time. Mental status is at baseline.  Psychiatric:        Mood and Affect: Mood normal.        Behavior: Behavior normal.     No results found for any visits on 10/02/23.      Assessment and Plan             No orders of the defined types were placed in this encounter.   No follow-ups on file.   The patient was advised to call back or seek an in-person evaluation if the symptoms worsen or if the  condition fails to improve as anticipated.  I discussed the assessment and treatment plan with the patient. The patient was provided an opportunity to ask questions and all were answered. The patient agreed with the plan and demonstrated an understanding of the instructions.  I, Debera Lat, PA-C have reviewed all documentation for this visit. The documentation on 10/02/2023  for the exam, diagnosis, procedures, and orders are all accurate and complete.  Debera Lat, [facility], MMS [facility] [phone] (phone) [phone] (fax)  [facility]

## 2023-10-02 ENCOUNTER — Ambulatory Visit: Payer: Self-pay | Admitting: Physician Assistant

## 2023-10-02 VITALS — BP 129/86 | HR 84 | Resp 16 | Ht 66.0 in | Wt 215.4 lb

## 2023-10-02 DIAGNOSIS — T887XXA Unspecified adverse effect of drug or medicament, initial encounter: Secondary | ICD-10-CM | POA: Diagnosis not present

## 2023-10-02 DIAGNOSIS — Z6834 Body mass index (BMI) 34.0-34.9, adult: Secondary | ICD-10-CM

## 2023-10-02 DIAGNOSIS — E039 Hypothyroidism, unspecified: Secondary | ICD-10-CM

## 2023-10-02 DIAGNOSIS — E1165 Type 2 diabetes mellitus with hyperglycemia: Secondary | ICD-10-CM | POA: Diagnosis not present

## 2023-10-02 DIAGNOSIS — I152 Hypertension secondary to endocrine disorders: Secondary | ICD-10-CM

## 2023-10-02 DIAGNOSIS — Z7984 Long term (current) use of oral hypoglycemic drugs: Secondary | ICD-10-CM

## 2023-10-02 DIAGNOSIS — E1169 Type 2 diabetes mellitus with other specified complication: Secondary | ICD-10-CM

## 2023-10-03 ENCOUNTER — Encounter: Payer: Self-pay | Admitting: Physician Assistant

## 2023-10-03 LAB — GLUCOSE, POCT (MANUAL RESULT ENTRY): POC Glucose: 133 mg/dL — AB (ref 70–99)

## 2023-10-11 ENCOUNTER — Other Ambulatory Visit: Payer: Self-pay | Admitting: Physician Assistant

## 2023-10-11 ENCOUNTER — Encounter: Payer: Self-pay | Admitting: Physician Assistant

## 2023-10-11 DIAGNOSIS — H8110 Benign paroxysmal vertigo, unspecified ear: Secondary | ICD-10-CM

## 2023-10-11 DIAGNOSIS — E1169 Type 2 diabetes mellitus with other specified complication: Secondary | ICD-10-CM

## 2023-10-11 DIAGNOSIS — E1165 Type 2 diabetes mellitus with hyperglycemia: Secondary | ICD-10-CM

## 2023-10-11 DIAGNOSIS — E039 Hypothyroidism, unspecified: Secondary | ICD-10-CM

## 2023-10-11 DIAGNOSIS — R030 Elevated blood-pressure reading, without diagnosis of hypertension: Secondary | ICD-10-CM

## 2023-10-12 MED ORDER — GLIPIZIDE ER 10 MG PO TB24
20.0000 mg | ORAL_TABLET | Freq: Every day | ORAL | 3 refills | Status: DC
Start: 2023-10-12 — End: 2023-10-16

## 2023-10-12 MED ORDER — LEVOTHYROXINE SODIUM 25 MCG PO TABS: 25.0000 ug | ORAL_TABLET | Freq: Every day | ORAL | 1 refills | Status: DC

## 2023-10-12 MED ORDER — AMLODIPINE BESYLATE 5 MG PO TABS: 5.0000 mg | ORAL_TABLET | Freq: Every day | ORAL | 1 refills | Status: DC

## 2023-10-12 MED ORDER — DAPAGLIFLOZIN PROPANEDIOL 5 MG PO TABS: 5.0000 mg | ORAL_TABLET | Freq: Every day | ORAL | 3 refills | Status: DC

## 2023-10-12 MED ORDER — TIRZEPATIDE 2.5 MG/0.5ML ~~LOC~~ SOAJ: 2.5000 mg | SUBCUTANEOUS | 1 refills | Status: DC

## 2023-10-12 MED ORDER — HYDROCHLOROTHIAZIDE 12.5 MG PO CAPS: 12.5000 mg | ORAL_CAPSULE | Freq: Every day | ORAL | 1 refills | Status: DC

## 2023-10-12 MED ORDER — ATORVASTATIN CALCIUM 80 MG PO TABS
80.0000 mg | ORAL_TABLET | Freq: Every day | ORAL | 3 refills | Status: AC
Start: 2023-10-12 — End: ?

## 2023-10-16 ENCOUNTER — Encounter: Payer: Self-pay | Admitting: Physician Assistant

## 2023-10-16 DIAGNOSIS — R112 Nausea with vomiting, unspecified: Secondary | ICD-10-CM

## 2023-10-16 MED ORDER — DAPAGLIFLOZIN PROPANEDIOL 5 MG PO TABS: 5.0000 mg | ORAL_TABLET | Freq: Every day | ORAL | 3 refills | Status: DC

## 2023-10-16 MED ORDER — AMLODIPINE BESYLATE 5 MG PO TABS: 5.0000 mg | ORAL_TABLET | Freq: Every day | ORAL | 1 refills | Status: DC

## 2023-10-16 MED ORDER — DAPAGLIFLOZIN PROPANEDIOL 5 MG PO TABS
5.0000 mg | ORAL_TABLET | Freq: Every day | ORAL | 3 refills | Status: AC
Start: 2023-10-16 — End: ?

## 2023-10-16 MED ORDER — LEVOTHYROXINE SODIUM 25 MCG PO TABS: 25.0000 ug | ORAL_TABLET | Freq: Every day | ORAL | 1 refills | Status: DC

## 2023-10-16 MED ORDER — GLIPIZIDE ER 10 MG PO TB24
20.0000 mg | ORAL_TABLET | Freq: Every day | ORAL | 3 refills | Status: AC
Start: 2023-10-16 — End: ?

## 2023-10-16 MED ORDER — TIRZEPATIDE 2.5 MG/0.5ML ~~LOC~~ SOAJ: 2.5000 mg | SUBCUTANEOUS | 1 refills | Status: DC

## 2023-10-16 MED ORDER — MECLIZINE HCL 25 MG PO TABS
25.0000 mg | ORAL_TABLET | Freq: Three times a day (TID) | ORAL | 0 refills | Status: DC | PRN
Start: 2023-10-16 — End: 2024-06-11

## 2023-10-16 NOTE — Addendum Note (Signed)
 Addended by: Liz Beach on: 10/16/2023 09:45 AM   Modules accepted: Orders

## 2023-10-17 ENCOUNTER — Encounter: Payer: Self-pay | Admitting: Physician Assistant

## 2023-10-17 ENCOUNTER — Telehealth: Payer: Self-pay

## 2023-10-17 ENCOUNTER — Ambulatory Visit: Payer: Self-pay

## 2023-10-17 NOTE — Telephone Encounter (Signed)
 Copied from CRM [id number]. Topic: General - Call Back - No Documentation >> Oct 17, 2023  2:45 PM Higinio Roger wrote: Reason for CRM: Patient is requesting a callback from Debera Lat, PA-C to speak with her about her symptoms from the tirzepatide [facility]) 2.5 MG/0.5ML Pen  Callback #: [phone]

## 2023-10-17 NOTE — Telephone Encounter (Signed)
 Summary: Diarrhea   Copied From CRM [id number]. Reason for Triage: Diarrhea  Patient states: Seems like every other week , I have and get diarrhea when I give the shot (tirzepatide [facility]) 2.5 MG/0.5ML Pen) on my left side... I had diarrhea for 2 days really bad and finally a friend gave me a Zofran and it stopped the diarrhea and nausea."  Callback #: [phone]  Preferred Pharmacy: [id number] [place], [place] - [address] [place] [address] Phone: [phone] Fax: [phone] Hours: Not open 24 hours        States she would like a rx for zofran for nausea with mounjaro.  Please call patient to let her know.

## 2023-10-19 NOTE — Telephone Encounter (Unsigned)
 Copied from CRM [id number]. Topic: General - Call Back - No Documentation >> Oct 17, 2023  2:45 PM Higinio Roger wrote: Reason for CRM: Patient is requesting a callback from Debera Lat, PA-C to speak with her about her symptoms from the tirzepatide [facility]) 2.5 MG/0.5ML Pen  Callback #: [phone] >> Oct 19, 2023  2:54 PM Emylou G wrote: [place] Spaniel zofran will help with bathroom issues

## 2023-10-20 ENCOUNTER — Encounter: Payer: Self-pay | Admitting: Physician Assistant

## 2023-10-22 ENCOUNTER — Encounter: Payer: Self-pay | Admitting: Physician Assistant

## 2023-10-22 MED ORDER — ONDANSETRON HCL 4 MG PO TABS: 4.0000 mg | ORAL_TABLET | Freq: Three times a day (TID) | ORAL | 0 refills | Status: DC | PRN

## 2023-11-04 ENCOUNTER — Encounter: Payer: Self-pay | Admitting: Physician Assistant

## 2023-11-04 DIAGNOSIS — E1165 Type 2 diabetes mellitus with hyperglycemia: Secondary | ICD-10-CM

## 2023-11-05 NOTE — Telephone Encounter (Signed)
 Please see the message below.

## 2023-11-05 NOTE — Telephone Encounter (Signed)
 Please see the pt request

## 2023-11-06 MED ORDER — TIRZEPATIDE 5 MG/0.5ML ~~LOC~~ SOAJ: 5.0000 mg | SUBCUTANEOUS | 0 refills | Status: DC

## 2023-11-11 ENCOUNTER — Other Ambulatory Visit: Payer: Self-pay | Admitting: Physician Assistant

## 2023-11-11 DIAGNOSIS — R112 Nausea with vomiting, unspecified: Secondary | ICD-10-CM

## 2023-11-13 ENCOUNTER — Other Ambulatory Visit: Payer: Self-pay | Admitting: Nurse Practitioner

## 2023-11-13 DIAGNOSIS — L739 Follicular disorder, unspecified: Secondary | ICD-10-CM

## 2023-11-13 NOTE — Telephone Encounter (Signed)
 Requested medication (s) are due for refill today: yes  Requested medication (s) are on the active medication list: yes  Last refill:  10/22/23 #20  Future visit scheduled: yes  Notes to clinic:  med not delegated to NT to RF   Requested Prescriptions  Pending Prescriptions Disp Refills   ondansetron  (ZOFRAN ) 4 MG tablet [Pharmacy Med Name: ONDANSETRON  HCL 4 MG TABLET] 18 tablet 1    Sig: TAKE 1 TABLET BY MOUTH EVERY 8 HOURS AS NEEDED FOR NAUSEA AND VOMITING     Not Delegated - Gastroenterology: Antiemetics - ondansetron  Failed - 11/13/2023 10:40 AM      Failed - This refill cannot be delegated      Passed - AST in normal range and within 360 days    AST  Date Value Ref Range Status  08/20/2023 17 0 - 40 IU/L Final         Passed - ALT in normal range and within 360 days    ALT  Date Value Ref Range Status  08/20/2023 19 0 - 32 IU/L Final         Passed - Valid encounter within last 6 months    Recent Outpatient Visits           1 month ago Type 2 diabetes mellitus with hyperglycemia, without long-term current use of insulin (HCC)   [place] [facility] [place], [place], PA-C       Future Appointments             In 1 week Ostwalt, Janna, PA-C [facility]

## 2023-11-22 ENCOUNTER — Ambulatory Visit: Admitting: Physician Assistant

## 2023-11-22 ENCOUNTER — Other Ambulatory Visit: Payer: Self-pay | Admitting: Physician Assistant

## 2023-11-22 ENCOUNTER — Encounter: Payer: Self-pay | Admitting: Physician Assistant

## 2023-11-22 DIAGNOSIS — E785 Hyperlipidemia, unspecified: Secondary | ICD-10-CM

## 2023-11-22 DIAGNOSIS — E038 Other specified hypothyroidism: Secondary | ICD-10-CM

## 2023-11-22 DIAGNOSIS — E1165 Type 2 diabetes mellitus with hyperglycemia: Secondary | ICD-10-CM | POA: Diagnosis not present

## 2023-11-22 DIAGNOSIS — Z1231 Encounter for screening mammogram for malignant neoplasm of breast: Secondary | ICD-10-CM

## 2023-11-22 DIAGNOSIS — I152 Hypertension secondary to endocrine disorders: Secondary | ICD-10-CM

## 2023-11-22 DIAGNOSIS — E1159 Type 2 diabetes mellitus with other circulatory complications: Secondary | ICD-10-CM | POA: Diagnosis not present

## 2023-11-22 DIAGNOSIS — E1169 Type 2 diabetes mellitus with other specified complication: Secondary | ICD-10-CM

## 2023-11-22 NOTE — Progress Notes (Signed)
 Established patient visit  Patient: Tammie Bush   DOB: 04-09-1971   52 y.o. Female  MRN: [id number] Visit Date: 11/22/2023  Today's healthcare provider: Blane Bunting, PA-C   Chief Complaint  Patient presents with   Follow-up    7 wk f/u  DM  A1C   Subjective       Discussed the use of AI scribe software for clinical note transcription with the patient, who gave verbal consent to proceed.  History of Present Illness Tammie Bush is a 53 year old female with diabetes who presents for weight management and medication follow-up.  She is currently on Mounjaro, having increased her dose from 2.5 mg to 5 mg last Friday, with plans to take her second 5 mg dose tomorrow. She has achieved a total weight loss of 25 pounds since starting the medication, with 10 pounds lost in the past six weeks. She prefers a gradual weight loss approach.  She experiences a 'weird' sensation with injections, noting increased bowel movements when injecting on the left side, though without associated pain. Her diet is low-carb and high-protein, primarily consisting of grilled chicken and salads, and she avoids fried foods. She plans to increase her physical activity by walking more regularly once school is out for the summer.  She takes levothyroxine  for her thyroid and is interested in checking her thyroid levels due to her weight loss. She has experienced nausea but has only used Zofran  twice, preferring to avoid medication unless necessary. She denies dizziness, headache, cold or heat intolerance, hoarse voice, breathing difficulties, or leg swelling, and she wears compression stockings daily.       08/14/2023    4:06 PM 08/07/2022    3:14 PM 02/02/2022    1:31 PM  Depression screen PHQ 2/9  Decreased Interest 0 0 0  Down, Depressed, Hopeless 0 0 0  PHQ - 2 Score 0 0 0  Altered sleeping  0 0  Tired, decreased energy  1 1  Change in appetite  0 0  Feeling bad or failure about yourself   0 0  Trouble  concentrating  0 0  Moving slowly or fidgety/restless  0 0  Suicidal thoughts  0 0  PHQ-9 Score  1 1  Difficult doing work/chores  Not difficult at all Not difficult at all      08/14/2023    4:06 PM  GAD 7 : Generalized Anxiety Score  Nervous, Anxious, on Edge 0  Control/stop worrying 0  Worry too much - different things 0  Trouble relaxing 0  Restless 0  Easily annoyed or irritable 0  Afraid - awful might happen 0  Total GAD 7 Score 0  Anxiety Difficulty Not difficult at all    Medications: Outpatient Medications Prior to Visit  Medication Sig   amLODipine  (NORVASC ) 5 MG tablet Take 1 tablet (5 mg total) by mouth daily.   atorvastatin  (LIPITOR) 80 MG tablet Take 1 tablet (80 mg total) by mouth daily.   dapagliflozin  propanediol (FARXIGA ) 5 MG TABS tablet Take 1 tablet (5 mg total) by mouth daily.   glipiZIDE  (GLUCOTROL  XL) 10 MG 24 hr tablet Take 2 tablets (20 mg total) by mouth daily with breakfast.   hydrochlorothiazide  (MICROZIDE ) 12.5 MG capsule Take 1 capsule (12.5 mg total) by mouth daily.   levothyroxine  (SYNTHROID ) 25 MCG tablet Take 1 tablet (25 mcg total) by mouth daily.   meclizine  (ANTIVERT ) 25 MG tablet Take 1 tablet (25 mg total) by mouth 3 (three) times  daily as needed for dizziness.   ondansetron  (ZOFRAN ) 4 MG tablet TAKE 1 TABLET BY MOUTH EVERY 8 HOURS AS NEEDED FOR NAUSEA AND VOMITING   tirzepatide (MOUNJARO) 5 MG/0.5ML Pen Inject 5 mg into the skin once a week.   tirzepatide (MOUNJARO) 2.5 MG/0.5ML Pen Inject 2.5 mg into the skin once a week.   No facility-administered medications prior to visit.    Review of Systems  All other systems reviewed and are negative.  All negative Except see HPI       Objective    BP 119/72 (BP Location: Left Arm, Patient Position: Sitting, Cuff Size: Normal)   Pulse 83   Resp 16   Ht 5\' 6"  (1.676 m)   Wt 205 lb 11.2 oz (93.3 kg)   SpO2 98%   BMI 33.20 kg/m     Physical Exam Vitals reviewed.  Constitutional:       General: She is not in acute distress.    Appearance: Normal appearance. She is well-developed. She is obese. She is not diaphoretic.  HENT:     Head: Normocephalic and atraumatic.  Eyes:     General: No scleral icterus.    Conjunctiva/sclera: Conjunctivae normal.  Neck:     Thyroid: No thyromegaly.  Cardiovascular:     Rate and Rhythm: Normal rate and regular rhythm.     Pulses: Normal pulses.     Heart sounds: Normal heart sounds. No murmur heard. Pulmonary:     Effort: Pulmonary effort is normal. No respiratory distress.     Breath sounds: Normal breath sounds. No wheezing, rhonchi or rales.  Musculoskeletal:     Cervical back: Neck supple.     Right lower leg: No edema.     Left lower leg: No edema.  Lymphadenopathy:     Cervical: No cervical adenopathy.  Skin:    General: Skin is warm and dry.     Findings: No rash.  Neurological:     Mental Status: She is alert and oriented to person, place, and time. Mental status is at baseline.  Psychiatric:        Mood and Affect: Mood normal.        Behavior: Behavior normal.      No results found for any visits on 11/22/23.      Assessment & Plan Type 2 Diabetes Mellitus Chronic A1c from 2.3.25 7.8 GFR 89 Managed with Mounjaro 5 mg. Regular A1c monitoring necessary. Discussed gradual weight loss to prevent complications. - Continue Mounjaro 5 mg, farxiga  5, glipizide  10. - Order A1c test. - Encourage adherence to low-carb, high-protein diet. - Encourage increased physical activity during summer. Will follow-up  Obesity Chronic Body mass index is 33.2 kg/m. Managed with Mounjaro and lifestyle modifications. Emphasized gradual weight loss to avoid complications. Discussed maintaining healthy diet and exercise regimen. - Continue Mounjaro 5 mg - Encourage gradual weight loss. - Encourage adherence to healthy diet. - Encourage increased physical activity. Will follow-up  Hypothyroidism Chronic TSH was  wnl Managed with levothyroxine  25mcg. Discussed potential impact of weight loss on thyroid function and importance of monitoring. - Order thyroid function tests. - Continue levothyroxine . Will follow-up  Morbid obesity (HCC) (Primary) - Hemoglobin A1c - Lipid panel - TSH - Comprehensive metabolic panel with GFR - CBC with Differential/Platelet  Type 2 diabetes mellitus with hyperglycemia, without long-term current use of insulin (HCC) - Hemoglobin A1c - Lipid panel - TSH - Comprehensive metabolic panel with GFR - CBC with Differential/Platelet  Hyperlipidemia associated with  type 2 diabetes mellitus (HCC) Chronic and previously stable LDL 101, goal less than 100 vs 70 The 10-year ASCVD risk score (Arnett DK, et al., 2019) is: 3.2% Continue taking atorvastatin  80mg  ordered - Hemoglobin A1c - Lipid panel - TSH - Comprehensive metabolic panel with GFR - CBC with Differential/Platelet Will reassess after  receiving lab results Continue low cholesterol diet and exercise  Hypertension Chronic and stable Continue amlodipine  5 mg Will follow-up Ordered labs see below  Orders Placed This Encounter  Procedures   Hemoglobin A1c   Lipid panel    Has the patient fasted?:   Yes   TSH   Comprehensive metabolic panel with GFR    Has the patient fasted?:   Yes   CBC with Differential/Platelet    Return in about 3 months (around 02/22/2024) for chronic disease f/u.   The patient was advised to call back or seek an in-person evaluation if the symptoms worsen or if the condition fails to improve as anticipated.  I discussed the assessment and treatment plan with the patient. The patient was provided an opportunity to ask questions and all were answered. The patient agreed with the plan and demonstrated an understanding of the instructions.  I, Kamala Kolton, PA-C have reviewed all documentation for this visit. The documentation on 11/22/2023  for the exam, diagnosis, procedures, and  orders are all accurate and complete.  Blane Bunting, [facility], MMS [facility] [phone] (phone) [phone] (fax)  [facility]

## 2023-11-27 ENCOUNTER — Encounter: Payer: Self-pay | Admitting: Physician Assistant

## 2023-11-27 LAB — CBC WITH DIFFERENTIAL/PLATELET
Basophils Absolute: 0.1 10*3/uL (ref 0.0–0.2)
Basos: 1 %
EOS (ABSOLUTE): 0.7 10*3/uL — ABNORMAL HIGH (ref 0.0–0.4)
Eos: 6 %
Hematocrit: 48 % — ABNORMAL HIGH (ref 34.0–46.6)
Hemoglobin: 15.5 g/dL (ref 11.1–15.9)
Immature Grans (Abs): 0 10*3/uL (ref 0.0–0.1)
Immature Granulocytes: 0 %
Lymphocytes Absolute: 3.7 10*3/uL — ABNORMAL HIGH (ref 0.7–3.1)
Lymphs: 35 %
MCH: 28.3 pg (ref 26.6–33.0)
MCHC: 32.3 g/dL (ref 31.5–35.7)
MCV: 88 fL (ref 79–97)
Monocytes Absolute: 0.6 10*3/uL (ref 0.1–0.9)
Monocytes: 5 %
Neutrophils Absolute: 5.7 10*3/uL (ref 1.4–7.0)
Neutrophils: 53 %
Platelets: 388 10*3/uL (ref 150–450)
RBC: 5.48 x10E6/uL — ABNORMAL HIGH (ref 3.77–5.28)
RDW: 14.1 % (ref 11.7–15.4)
WBC: 10.7 10*3/uL (ref 3.4–10.8)

## 2023-11-27 LAB — LIPID PANEL
Chol/HDL Ratio: 3.1 ratio (ref 0.0–4.4)
Cholesterol, Total: 138 mg/dL (ref 100–199)
HDL: 45 mg/dL (ref >39)
LDL Chol Calc (NIH): 71 mg/dL (ref 0–99)
Triglycerides: 123 mg/dL (ref 0–149)
VLDL Cholesterol Cal: 22 mg/dL (ref 5–40)

## 2023-11-27 LAB — COMPREHENSIVE METABOLIC PANEL WITH GFR
ALT: 33 IU/L — ABNORMAL HIGH (ref 0–32)
AST: 30 IU/L (ref 0–40)
Albumin: 4.6 g/dL (ref 3.8–4.9)
Alkaline Phosphatase: 129 IU/L — ABNORMAL HIGH (ref 44–121)
BUN/Creatinine Ratio: 17 (ref 9–23)
BUN: 15 mg/dL (ref 6–24)
Bilirubin Total: 0.4 mg/dL (ref 0.0–1.2)
CO2: 24 mmol/L (ref 20–29)
Calcium: 9.6 mg/dL (ref 8.7–10.2)
Chloride: 94 mmol/L — ABNORMAL LOW (ref 96–106)
Creatinine, Ser: 0.9 mg/dL (ref 0.57–1.00)
Globulin, Total: 2.8 g/dL (ref 1.5–4.5)
Glucose: 111 mg/dL — ABNORMAL HIGH (ref 70–99)
Potassium: 3.6 mmol/L (ref 3.5–5.2)
Sodium: 137 mmol/L (ref 134–144)
Total Protein: 7.4 g/dL (ref 6.0–8.5)
eGFR: 77 mL/min/{1.73_m2} (ref >59)

## 2023-11-27 LAB — HEMOGLOBIN A1C
Est. average glucose Bld gHb Est-mCnc: 143 mg/dL
Hgb A1c MFr Bld: 6.6 % — ABNORMAL HIGH (ref 4.8–5.6)

## 2023-11-27 LAB — TSH: TSH: 3.29 u[IU]/mL (ref 0.450–4.500)

## 2023-11-27 NOTE — Progress Notes (Signed)
 Please check with [facility] if we coulld add ggt test

## 2023-12-12 ENCOUNTER — Encounter: Payer: Self-pay | Admitting: Physician Assistant

## 2023-12-13 ENCOUNTER — Other Ambulatory Visit: Payer: Self-pay

## 2023-12-13 DIAGNOSIS — E1165 Type 2 diabetes mellitus with hyperglycemia: Secondary | ICD-10-CM

## 2023-12-13 NOTE — Telephone Encounter (Signed)
 RX request has been sent to the provider to be reviewed

## 2023-12-13 NOTE — Telephone Encounter (Signed)
 LOV 11/22/23 NOV 02/22/24 LRF 11/06/23 LABS 11/26/23

## 2023-12-13 NOTE — Telephone Encounter (Signed)
 Called and spoke with the patient, who reported that the pharmacy informed her a prescription was sent for 2.5 mg. After contacting the pharmacy, it was confirmed that there was a communication error. The patient did in fact pick up a 5 mg prescription on 5/28.

## 2024-02-07 ENCOUNTER — Encounter: Payer: Self-pay | Admitting: Physician Assistant

## 2024-02-07 ENCOUNTER — Telehealth: Payer: Self-pay

## 2024-02-07 ENCOUNTER — Other Ambulatory Visit: Payer: Self-pay | Admitting: Physician Assistant

## 2024-02-07 DIAGNOSIS — E1165 Type 2 diabetes mellitus with hyperglycemia: Secondary | ICD-10-CM

## 2024-02-07 MED ORDER — TIRZEPATIDE 5 MG/0.5ML ~~LOC~~ SOAJ: 5.0000 mg | SUBCUTANEOUS | 0 refills | Status: DC

## 2024-02-07 NOTE — Telephone Encounter (Unsigned)
 Copied from CRM [id number]. Topic: Clinical - Medication Refill >> Feb 07, 2024  3:36 PM Rachelle R wrote: Medication: tirzepatide  (MOUNJARO ) 5 MG/0.5ML Pen  Has the patient contacted their pharmacy? Yes, call dr  This is the patient's preferred pharmacy:  [id number] [place] [place] - [address] [place] [address] Phone: [phone] Fax: [phone]  Is this the correct pharmacy for this prescription? Yes If no, delete pharmacy and type the correct one.   Has the prescription been filled recently? No  Is the patient out of the medication? No, 1 shot left  Has the patient been seen for an appointment in the last year OR does the patient have an upcoming appointment? Yes  Can we respond through MyChart? Yes  Agent: Please be advised that Rx refills may take up to 3 business days. We ask that you follow-up with your pharmacy.

## 2024-02-07 NOTE — Telephone Encounter (Signed)
 Copied from CRM [id number]. Topic: Clinical - Request for Lab/Test Order >> Feb 07, 2024  3:37 PM Willma R wrote: Reason for CRM: Patient is scheduled for a 3 month follow up on 02/22/24. Would like to have her lab work done in advance and is requesting orders to be put in her chart.  Patient can be reached at [phone]

## 2024-02-08 NOTE — Telephone Encounter (Signed)
 Requested medication (s) are due for refill today: no  Requested medication (s) are on the active medication list: yes  Last refill:  02/07/24 6 ml  Future visit scheduled: yes  Notes to clinic:   med not assigned to a protocol   Requested Prescriptions  Pending Prescriptions Disp Refills   tirzepatide  (MOUNJARO ) 5 MG/0.5ML Pen 6 mL 0    Sig: Inject 5 mg into the skin once a week.     Off-Protocol Failed - 02/08/2024  3:56 PM      Failed - Medication not assigned to a protocol, review manually.      Passed - Valid encounter within last 12 months    Recent Outpatient Visits           2 months ago Morbid obesity [facility])   [place] [facility] [place], [place], PA-C   4 months ago Type 2 diabetes mellitus with hyperglycemia, without long-term current use of insulin [facility])   [place] [facility] [place], [place], PA-C

## 2024-02-22 ENCOUNTER — Ambulatory Visit: Admitting: Physician Assistant

## 2024-02-22 ENCOUNTER — Encounter: Payer: Self-pay | Admitting: Physician Assistant

## 2024-02-22 VITALS — BP 115/80 | HR 71 | Resp 14 | Ht 66.0 in | Wt 190.0 lb

## 2024-02-22 DIAGNOSIS — E1165 Type 2 diabetes mellitus with hyperglycemia: Secondary | ICD-10-CM | POA: Diagnosis not present

## 2024-02-22 DIAGNOSIS — R202 Paresthesia of skin: Secondary | ICD-10-CM

## 2024-02-22 DIAGNOSIS — E1159 Type 2 diabetes mellitus with other circulatory complications: Secondary | ICD-10-CM | POA: Diagnosis not present

## 2024-02-22 DIAGNOSIS — Z7985 Long-term (current) use of injectable non-insulin antidiabetic drugs: Secondary | ICD-10-CM

## 2024-02-22 DIAGNOSIS — E038 Other specified hypothyroidism: Secondary | ICD-10-CM

## 2024-02-22 DIAGNOSIS — I152 Hypertension secondary to endocrine disorders: Secondary | ICD-10-CM

## 2024-02-22 DIAGNOSIS — E785 Hyperlipidemia, unspecified: Secondary | ICD-10-CM

## 2024-02-22 DIAGNOSIS — Z85828 Personal history of other malignant neoplasm of skin: Secondary | ICD-10-CM | POA: Insufficient documentation

## 2024-02-22 DIAGNOSIS — E1169 Type 2 diabetes mellitus with other specified complication: Secondary | ICD-10-CM | POA: Diagnosis not present

## 2024-02-22 DIAGNOSIS — R200 Anesthesia of skin: Secondary | ICD-10-CM

## 2024-02-22 NOTE — Progress Notes (Signed)
 Established patient visit  Patient: Tammie Bush   DOB: May 29, 1971   53 y.o. Female  MRN: [id number] Visit Date: 02/22/2024  Today's healthcare provider: Jolynn Spencer, PA-C   Chief Complaint  Patient presents with   Follow-up    Chronic 3 month   Hypertension   Hyperlipidemia   Hypothyroidism   Diabetes    No exam done    Subjective     HPI     Follow-up    Additional comments: Chronic 3 month        Diabetes    Additional comments: No exam done       Last edited by Wilfred Hargis RAMAN, CMA on 02/22/2024  2:21 PM.       Discussed the use of AI scribe software for clinical note transcription with the patient, who gave verbal consent to proceed.  History of Present Illness Tammie Bush is a 53 year old female with diabetes and obesity who presents for a follow-up visit to monitor weight loss and diabetes management.  She has lost 45 pounds over the past five months since starting Mounjaro , currently on a 5 mg dose. She is comfortable with the gradual weight loss, averaging about five pounds per month, and emphasizes the importance of diet and exercise, engaging in activities like walking.  She plans to check her A1c levels next week and has been closely monitoring her diet and exercise to manage her diabetes. No issues with vision, dizziness, or blurry vision, and she maintains a balance between fluid intake and output.  She continues to take medication for blood pressure and cholesterol regularly and expresses interest in monitoring her thyroid function during her next blood work.  She notes improvement in gastrointestinal symptoms, with reduced diarrhea.  She experiences occasional tingling in her arms at night, resolving upon shaking them out, attributing this to her sleeping position. She is aware of the potential for carpal tunnel syndrome, given her diabetes and extensive use of her hands at work.  She has experienced minimal nausea and vomiting since starting  Mounjaro , with only one episode of vomiting.       02/22/2024    2:22 PM 08/14/2023    4:06 PM 08/07/2022    3:14 PM  Depression screen PHQ 2/9  Decreased Interest 0 0 0  Down, Depressed, Hopeless 0 0 0  PHQ - 2 Score 0 0 0  Altered sleeping 0  0  Tired, decreased energy 0  1  Change in appetite 0  0  Feeling bad or failure about yourself  0  0  Trouble concentrating 0  0  Moving slowly or fidgety/restless 0  0  Suicidal thoughts 0  0  PHQ-9 Score 0  1  Difficult doing work/chores   Not difficult at all      08/14/2023    4:06 PM  GAD 7 : Generalized Anxiety Score  Nervous, Anxious, on Edge 0  Control/stop worrying 0  Worry too much - different things 0  Trouble relaxing 0  Restless 0  Easily annoyed or irritable 0  Afraid - awful might happen 0  Total GAD 7 Score 0  Anxiety Difficulty Not difficult at all    Medications: Outpatient Medications Prior to Visit  Medication Sig   amLODipine  (NORVASC ) 5 MG tablet Take 1 tablet (5 mg total) by mouth daily.   atorvastatin  (LIPITOR) 80 MG tablet Take 1 tablet (80 mg total) by mouth daily.   dapagliflozin  propanediol (FARXIGA ) 5 MG TABS tablet  Take 1 tablet (5 mg total) by mouth daily.   glipiZIDE  (GLUCOTROL  XL) 10 MG 24 hr tablet Take 2 tablets (20 mg total) by mouth daily with breakfast.   hydrochlorothiazide  (MICROZIDE ) 12.5 MG capsule Take 1 capsule (12.5 mg total) by mouth daily.   levothyroxine  (SYNTHROID ) 25 MCG tablet Take 1 tablet (25 mcg total) by mouth daily.   meclizine  (ANTIVERT ) 25 MG tablet Take 1 tablet (25 mg total) by mouth 3 (three) times daily as needed for dizziness. (Patient taking differently: Take 25 mg by mouth as needed for dizziness.)   ondansetron  (ZOFRAN ) 4 MG tablet TAKE 1 TABLET BY MOUTH EVERY 8 HOURS AS NEEDED FOR NAUSEA AND VOMITING (Patient taking differently: Take 4 mg by mouth as needed.)   tirzepatide  (MOUNJARO ) 5 MG/0.5ML Pen Inject 5 mg into the skin once a week.   No facility-administered  medications prior to visit.    Review of Systems All negative Except see HPI       Objective    BP 115/80 (BP Location: Left Arm, Patient Position: Sitting, Cuff Size: Normal)   Pulse 71   Resp 14   Ht 5' 6 (1.676 m)   Wt 190 lb (86.2 kg)   SpO2 97%   BMI 30.67 kg/m     Physical Exam Vitals reviewed.  Constitutional:      General: She is not in acute distress.    Appearance: Normal appearance. She is well-developed. She is obese. She is not diaphoretic.  HENT:     Head: Normocephalic and atraumatic.  Eyes:     General: No scleral icterus.    Conjunctiva/sclera: Conjunctivae normal.  Neck:     Thyroid: No thyromegaly.  Cardiovascular:     Rate and Rhythm: Normal rate and regular rhythm.     Pulses: Normal pulses.     Heart sounds: Normal heart sounds. No murmur heard. Pulmonary:     Effort: Pulmonary effort is normal. No respiratory distress.     Breath sounds: Normal breath sounds. No wheezing, rhonchi or rales.  Musculoskeletal:     Cervical back: Neck supple.     Right lower leg: No edema.     Left lower leg: No edema.  Lymphadenopathy:     Cervical: No cervical adenopathy.  Skin:    General: Skin is warm and dry.     Findings: No rash.  Neurological:     Mental Status: She is alert and oriented to person, place, and time. Mental status is at baseline.  Psychiatric:        Mood and Affect: Mood normal.        Behavior: Behavior normal.      No results found for any visits on 02/22/24.       Assessment & Plan Type 2 diabetes mellitus Chronic and stable  Patient is not measuring show blood sugar at home  managed with Mounjaro , resulting in significant weight loss. No current vision issues or significant side effects. Previous nausea and diarrhea resolved. Emphasized importance of diet and exercise adherence. - Order A1c, kidney function, liver function, electrolytes, hemoglobin level, and lipid panel tests. - Collect urine sample during lab  work. - Encourage continued adherence to diet and exercise regimen. Will follow-up  Obesity Chronic and improving  managed with Mounjaro  5 mg, leading to significant weight loss. Encouraged continued weight loss through diet and exercise. - Continue Mounjaro  5 mg. - Encourage continued weight loss through diet and exercise. Will follow-up  Hypertension Chronic and stable  managed with medication. Blood pressure control is crucial, especially with weight loss and diabetes management. - Continue current hypertension medications. Continue lifestyle modifications We will follow-up  Hyperlipidemia Chronic and stable  managed with medication. Lipid levels will be checked for potential medication adjustments. Weight loss may improve lipid levels. - Order lipid panel. - Continue current hyperlipidemia medications. Will follow-up  Hypothyroidism Chronic and previously stable  managed with medication. Thyroid function will be assessed to determine impact of weight loss on hormone requirements. - Order thyroid function tests. - Continue current hypothyroidism medications. Will follow-up  Tingling and numbness in both hands  possible carpal tunnel syndrome Occasional nocturnal tingling in arms, resolving with movement, may indicate early carpal tunnel syndrome, especially given diabetes and manual work history. - Consider using a wrist brace during work and at night for prevention.  History of skin cancer History related to sun exposure. - Recommend using Elta sunscreen for sun protection.    Orders Placed This Encounter  Procedures   Comprehensive metabolic panel with GFR    Has the patient fasted?:   Yes   CBC with Differential/Platelet   Hemoglobin A1c   Lipid panel    Has the patient fasted?:   Yes   TSH   Microalbumin / creatinine urine ratio    Return in about 3 months (around 05/24/2024) for chronic disease f/u.   The patient was advised to call back or seek an  in-person evaluation if the symptoms worsen or if the condition fails to improve as anticipated.  I discussed the assessment and treatment plan with the patient. The patient was provided an opportunity to ask questions and all were answered. The patient agreed with the plan and demonstrated an understanding of the instructions.  I, Santo Zahradnik, PA-C have reviewed all documentation for this visit. The documentation on 02/22/2024  for the exam, diagnosis, procedures, and orders are all accurate and complete.  Jolynn Spencer, [facility], MMS [facility] [phone] (phone) [phone] (fax)  [facility]

## 2024-02-25 ENCOUNTER — Encounter: Payer: Self-pay | Admitting: Physician Assistant

## 2024-02-27 ENCOUNTER — Ambulatory Visit
Admission: RE | Admit: 2024-02-27 | Discharge: 2024-02-27 | Disposition: A | Discharge status: Home / Self Care | Source: Ambulatory Visit | Attending: Physician Assistant | Admitting: Physician Assistant

## 2024-02-27 DIAGNOSIS — Z1231 Encounter for screening mammogram for malignant neoplasm of breast: Secondary | ICD-10-CM | POA: Diagnosis present

## 2024-02-28 ENCOUNTER — Encounter: Payer: Self-pay | Admitting: Physician Assistant

## 2024-02-28 LAB — COMPREHENSIVE METABOLIC PANEL WITH GFR
ALT: 20 IU/L (ref 0–32)
AST: 20 IU/L (ref 0–40)
Albumin: 4.5 g/dL (ref 3.8–4.9)
Alkaline Phosphatase: 105 IU/L (ref 44–121)
BUN/Creatinine Ratio: 17 (ref 9–23)
BUN: 16 mg/dL (ref 6–24)
Bilirubin Total: 0.5 mg/dL (ref 0.0–1.2)
CO2: 24 mmol/L (ref 20–29)
Calcium: 10.1 mg/dL (ref 8.7–10.2)
Chloride: 95 mmol/L — ABNORMAL LOW (ref 96–106)
Creatinine, Ser: 0.96 mg/dL (ref 0.57–1.00)
Globulin, Total: 3.1 g/dL (ref 1.5–4.5)
Glucose: 160 mg/dL — ABNORMAL HIGH (ref 70–99)
Potassium: 3.5 mmol/L (ref 3.5–5.2)
Sodium: 138 mmol/L (ref 134–144)
Total Protein: 7.6 g/dL (ref 6.0–8.5)
eGFR: 71 mL/min/1.73 (ref >59)

## 2024-02-28 LAB — CBC WITH DIFFERENTIAL/PLATELET
Basophils Absolute: 0.1 x10E3/uL (ref 0.0–0.2)
Basos: 0 %
EOS (ABSOLUTE): 0.3 x10E3/uL (ref 0.0–0.4)
Eos: 3 %
Hematocrit: 47.3 % — ABNORMAL HIGH (ref 34.0–46.6)
Hemoglobin: 15.4 g/dL (ref 11.1–15.9)
Immature Grans (Abs): 0 x10E3/uL (ref 0.0–0.1)
Immature Granulocytes: 0 %
Lymphocytes Absolute: 3.6 x10E3/uL — ABNORMAL HIGH (ref 0.7–3.1)
Lymphs: 32 %
MCH: 28.9 pg (ref 26.6–33.0)
MCHC: 32.6 g/dL (ref 31.5–35.7)
MCV: 89 fL (ref 79–97)
Monocytes Absolute: 0.5 x10E3/uL (ref 0.1–0.9)
Monocytes: 5 %
Neutrophils Absolute: 6.9 x10E3/uL (ref 1.4–7.0)
Neutrophils: 60 %
Platelets: 390 x10E3/uL (ref 150–450)
RBC: 5.33 x10E6/uL — ABNORMAL HIGH (ref 3.77–5.28)
RDW: 14.3 % (ref 11.7–15.4)
WBC: 11.4 x10E3/uL — ABNORMAL HIGH (ref 3.4–10.8)

## 2024-02-28 LAB — HEMOGLOBIN A1C
Est. average glucose Bld gHb Est-mCnc: 117 mg/dL
Hgb A1c MFr Bld: 5.7 % — ABNORMAL HIGH (ref 4.8–5.6)

## 2024-02-28 LAB — LIPID PANEL
Chol/HDL Ratio: 3.3 ratio (ref 0.0–4.4)
Cholesterol, Total: 177 mg/dL (ref 100–199)
HDL: 53 mg/dL (ref >39)
LDL Chol Calc (NIH): 102 mg/dL — ABNORMAL HIGH (ref 0–99)
Triglycerides: 124 mg/dL (ref 0–149)
VLDL Cholesterol Cal: 22 mg/dL (ref 5–40)

## 2024-02-28 LAB — MICROALBUMIN / CREATININE URINE RATIO
Creatinine, Urine: 157.5 mg/dL
Microalb/Creat Ratio: 6 mg/g{creat} (ref 0–29)
Microalbumin, Urine: 9.5 ug/mL

## 2024-02-28 LAB — TSH: TSH: 3.84 u[IU]/mL (ref 0.450–4.500)

## 2024-02-29 ENCOUNTER — Ambulatory Visit: Payer: Self-pay | Admitting: Physician Assistant

## 2024-03-05 LAB — HM DIABETES EYE EXAM

## 2024-03-22 ENCOUNTER — Encounter: Payer: Self-pay | Admitting: Physician Assistant

## 2024-03-22 DIAGNOSIS — E1165 Type 2 diabetes mellitus with hyperglycemia: Secondary | ICD-10-CM

## 2024-03-28 ENCOUNTER — Other Ambulatory Visit: Payer: Self-pay | Admitting: Physician Assistant

## 2024-03-28 DIAGNOSIS — E1165 Type 2 diabetes mellitus with hyperglycemia: Secondary | ICD-10-CM

## 2024-03-28 MED ORDER — TIRZEPATIDE 7.5 MG/0.5ML ~~LOC~~ SOAJ: 7.5000 mg | SUBCUTANEOUS | 0 refills | Status: DC

## 2024-03-30 ENCOUNTER — Encounter: Payer: Self-pay | Admitting: Physician Assistant

## 2024-04-01 ENCOUNTER — Other Ambulatory Visit (HOSPITAL_COMMUNITY): Payer: Self-pay

## 2024-04-01 ENCOUNTER — Telehealth: Payer: Self-pay

## 2024-04-01 NOTE — Telephone Encounter (Signed)
 PA has been submitted, currently pending, thanks.

## 2024-04-01 NOTE — Telephone Encounter (Signed)
PA has been approved, thank you!

## 2024-04-01 NOTE — Telephone Encounter (Signed)
 Pharmacy Patient Advocate Encounter  Received notification from [facility] that Prior Authorization for Mounjaro  7.5MG /0.5ML auto-injectors has been APPROVED from 04/01/2024 to 04/02/2027   PA #/Case ID/Reference #: [id number]

## 2024-04-01 NOTE — Telephone Encounter (Signed)
 Pharmacy Patient Advocate Encounter   Received notification from Physician's Office that prior authorization for Mounjaro  7.5MG /0.5ML auto-injectors is required/requested.   Insurance verification completed.   The patient is insured through [facility] ADVANTAGE/RX ADVANCE .   Per test claim: PA required; PA submitted to above mentioned insurance via Latent Key/confirmation #/EOC [facility] Status is pending

## 2024-04-01 NOTE — Telephone Encounter (Signed)
PA has been approved, thanks!

## 2024-04-01 NOTE — Telephone Encounter (Signed)
 Left message for pt regarding medication being approved.

## 2024-04-03 ENCOUNTER — Other Ambulatory Visit: Payer: Self-pay

## 2024-04-03 DIAGNOSIS — E1165 Type 2 diabetes mellitus with hyperglycemia: Secondary | ICD-10-CM

## 2024-04-03 MED ORDER — MOUNJARO 7.5 MG/0.5ML ~~LOC~~ SOAJ: 7.5000 mg | SUBCUTANEOUS | 0 refills | Status: DC

## 2024-05-09 ENCOUNTER — Other Ambulatory Visit: Payer: Self-pay | Admitting: Physician Assistant

## 2024-05-09 DIAGNOSIS — E039 Hypothyroidism, unspecified: Secondary | ICD-10-CM

## 2024-05-11 ENCOUNTER — Encounter: Payer: Self-pay | Admitting: Physician Assistant

## 2024-05-12 ENCOUNTER — Other Ambulatory Visit: Payer: Self-pay

## 2024-05-12 DIAGNOSIS — E039 Hypothyroidism, unspecified: Secondary | ICD-10-CM

## 2024-05-12 MED ORDER — LEVOTHYROXINE SODIUM 25 MCG PO TABS: 25.0000 ug | ORAL_TABLET | Freq: Every day | ORAL | 1 refills | Status: AC

## 2024-05-12 NOTE — Telephone Encounter (Signed)
 Duplicate request, refilled 05/12/24.  Requested Prescriptions  Pending Prescriptions Disp Refills   levothyroxine  (SYNTHROID ) 25 MCG tablet [Pharmacy Med Name: LEVOTHYROXINE  25 MCG TABLET] 90 tablet 1    Sig: TAKE 1 TABLET BY MOUTH EVERY DAY     Endocrinology:  Hypothyroid Agents Passed - 05/12/2024  1:43 PM      Passed - TSH in normal range and within 360 days    TSH  Date Value Ref Range Status  02/27/2024 3.840 0.450 - 4.500 uIU/mL Final         Passed - Valid encounter within last 12 months    Recent Outpatient Visits           2 months ago Type 2 diabetes mellitus with hyperglycemia, without long-term current use of insulin (HCC)   [place] [facility] [place], [place], PA-C   5 months ago Morbid obesity [facility])   [place] [facility] [place], [place], PA-C   7 months ago Type 2 diabetes mellitus with hyperglycemia, without long-term current use of insulin [facility])    [facility] [place], [place], PA-C

## 2024-05-29 ENCOUNTER — Encounter: Payer: Self-pay | Admitting: Physician Assistant

## 2024-05-30 ENCOUNTER — Other Ambulatory Visit: Payer: Self-pay

## 2024-05-30 DIAGNOSIS — I152 Hypertension secondary to endocrine disorders: Secondary | ICD-10-CM

## 2024-05-30 DIAGNOSIS — E1169 Type 2 diabetes mellitus with other specified complication: Secondary | ICD-10-CM

## 2024-06-08 ENCOUNTER — Other Ambulatory Visit: Payer: Self-pay | Admitting: Physician Assistant

## 2024-06-08 DIAGNOSIS — R030 Elevated blood-pressure reading, without diagnosis of hypertension: Secondary | ICD-10-CM

## 2024-06-09 ENCOUNTER — Other Ambulatory Visit: Payer: Self-pay

## 2024-06-09 DIAGNOSIS — E1169 Type 2 diabetes mellitus with other specified complication: Secondary | ICD-10-CM

## 2024-06-09 DIAGNOSIS — E1159 Type 2 diabetes mellitus with other circulatory complications: Secondary | ICD-10-CM

## 2024-06-10 LAB — LIPID PANEL WITH LDL/HDL RATIO
Cholesterol, Total: 154 mg/dL (ref 100–199)
HDL: 47 mg/dL (ref >39)
LDL Chol Calc (NIH): 90 mg/dL (ref 0–99)
LDL/HDL Ratio: 1.9 ratio (ref 0.0–3.2)
Triglycerides: 90 mg/dL (ref 0–149)
VLDL Cholesterol Cal: 17 mg/dL (ref 5–40)

## 2024-06-10 LAB — CBC WITH DIFFERENTIAL/PLATELET
Basophils Absolute: 0.1 x10E3/uL (ref 0.0–0.2)
Basos: 1 %
EOS (ABSOLUTE): 0.2 x10E3/uL (ref 0.0–0.4)
Eos: 2 %
Hematocrit: 49 % — ABNORMAL HIGH (ref 34.0–46.6)
Hemoglobin: 16.3 g/dL — ABNORMAL HIGH (ref 11.1–15.9)
Immature Grans (Abs): 0 x10E3/uL (ref 0.0–0.1)
Immature Granulocytes: 0 %
Lymphocytes Absolute: 3.4 x10E3/uL — ABNORMAL HIGH (ref 0.7–3.1)
Lymphs: 39 %
MCH: 29.2 pg (ref 26.6–33.0)
MCHC: 33.3 g/dL (ref 31.5–35.7)
MCV: 88 fL (ref 79–97)
Monocytes Absolute: 0.5 x10E3/uL (ref 0.1–0.9)
Monocytes: 5 %
Neutrophils Absolute: 4.7 x10E3/uL (ref 1.4–7.0)
Neutrophils: 53 %
Platelets: 404 x10E3/uL (ref 150–450)
RBC: 5.58 x10E6/uL — ABNORMAL HIGH (ref 3.77–5.28)
RDW: 13.4 % (ref 11.7–15.4)
WBC: 8.9 x10E3/uL (ref 3.4–10.8)

## 2024-06-10 LAB — COMPREHENSIVE METABOLIC PANEL WITH GFR
ALT: 20 IU/L (ref 0–32)
AST: 23 IU/L (ref 0–40)
Albumin: 4.7 g/dL (ref 3.8–4.9)
Alkaline Phosphatase: 96 IU/L (ref 49–135)
BUN/Creatinine Ratio: 17 (ref 9–23)
BUN: 17 mg/dL (ref 6–24)
Bilirubin Total: 0.5 mg/dL (ref 0.0–1.2)
CO2: 25 mmol/L (ref 20–29)
Calcium: 9.4 mg/dL (ref 8.7–10.2)
Chloride: 94 mmol/L — ABNORMAL LOW (ref 96–106)
Creatinine, Ser: 0.99 mg/dL (ref 0.57–1.00)
Globulin, Total: 2.8 g/dL (ref 1.5–4.5)
Glucose: 107 mg/dL — ABNORMAL HIGH (ref 70–99)
Potassium: 3.4 mmol/L — ABNORMAL LOW (ref 3.5–5.2)
Sodium: 137 mmol/L (ref 134–144)
Total Protein: 7.5 g/dL (ref 6.0–8.5)
eGFR: 68 mL/min/1.73 (ref >59)

## 2024-06-10 LAB — HEMOGLOBIN A1C
Est. average glucose Bld gHb Est-mCnc: 111 mg/dL
Hgb A1c MFr Bld: 5.5 % (ref 4.8–5.6)

## 2024-06-11 ENCOUNTER — Encounter: Payer: Self-pay | Admitting: Physician Assistant

## 2024-06-11 ENCOUNTER — Ambulatory Visit: Payer: Self-pay | Admitting: Physician Assistant

## 2024-06-11 ENCOUNTER — Ambulatory Visit: Admitting: Physician Assistant

## 2024-06-11 VITALS — BP 108/80 | HR 77 | Resp 14 | Ht 66.0 in | Wt 169.8 lb

## 2024-06-11 DIAGNOSIS — H8110 Benign paroxysmal vertigo, unspecified ear: Secondary | ICD-10-CM

## 2024-06-11 DIAGNOSIS — E039 Hypothyroidism, unspecified: Secondary | ICD-10-CM

## 2024-06-11 DIAGNOSIS — E1159 Type 2 diabetes mellitus with other circulatory complications: Secondary | ICD-10-CM

## 2024-06-11 DIAGNOSIS — E1169 Type 2 diabetes mellitus with other specified complication: Secondary | ICD-10-CM

## 2024-06-11 DIAGNOSIS — E1165 Type 2 diabetes mellitus with hyperglycemia: Secondary | ICD-10-CM

## 2024-06-11 MED ORDER — MOUNJARO 7.5 MG/0.5ML ~~LOC~~ SOAJ: 7.5000 mg | SUBCUTANEOUS | 0 refills | Status: AC

## 2024-06-11 MED ORDER — MECLIZINE HCL 25 MG PO TABS: 25.0000 mg | ORAL_TABLET | Freq: Three times a day (TID) | ORAL | 0 refills | Status: AC | PRN

## 2024-06-12 NOTE — Progress Notes (Signed)
 Established patient visit  Patient: Tammie Bush   DOB: [birth year]   53 y.o. Female  MRN: [id number] Visit Date: 06/11/2024  Today's healthcare provider: Jolynn Spencer, PA-C   Chief Complaint  Patient presents with   Medical Management of Chronic Issues    3 month f/u   Subjective      Discussed the use of AI scribe software for clinical note transcription with the patient, who gave verbal consent to proceed.  History of Present Illness Tammie Bush is a 53 year old female with diabetes who presents for a follow-up visit to review lab results and discuss medication management.  Her A1c is now in the normal range. She is worried about possible hypoglycemia from her regimen but has had no symptoms such as sweating or cognitive changes.  She takes a weekly injection every Friday evening. This causes decreased appetite and burping on Saturdays, with appetite back to normal by Sunday. She is on a 7.5 mg dose and is comfortable continuing it despite a 65-pound weight loss that concerns her family.  She reports no upper abdominal pain and no other current medication side effects.  She occasionally has vertigo, more often in winter, sometimes triggered by rolling over in bed. This has not occurred recently. She uses meclizine  1 tablet daily for a few days during episodes.  Her current medications are amlodipine  5 mg, atorvastatin  80 mg, glipizide  10 mg, hydrochlorothiazide  12.5 mg, and levothyroxine  25 mcg.  She is eating smaller portions, feels full faster, drinks plenty of water, and has shifted to healthier foods, which she feels contributes to her weight loss.       02/22/2024    2:22 PM 08/14/2023    4:06 PM 08/07/2022    3:14 PM  Depression screen PHQ 2/9  Decreased Interest 0 0 0  Down, Depressed, Hopeless 0 0 0  PHQ - 2 Score 0 0 0  Altered sleeping 0  0  Tired, decreased energy 0  1  Change in appetite 0  0  Feeling bad or failure about yourself  0  0  Trouble  concentrating 0  0  Moving slowly or fidgety/restless 0  0  Suicidal thoughts 0  0  PHQ-9 Score 0   1   Difficult doing work/chores   Not difficult at all     Data saved with a previous flowsheet row definition      08/14/2023    4:06 PM  GAD 7 : Generalized Anxiety Score  Nervous, Anxious, on Edge 0  Control/stop worrying 0  Worry too much - different things 0  Trouble relaxing 0  Restless 0  Easily annoyed or irritable 0  Afraid - awful might happen 0  Total GAD 7 Score 0  Anxiety Difficulty Not difficult at all    Medications: Outpatient Medications Prior to Visit  Medication Sig   amLODipine  (NORVASC ) 5 MG tablet Take 1 tablet (5 mg total) by mouth daily.   atorvastatin  (LIPITOR) 80 MG tablet Take 1 tablet (80 mg total) by mouth daily.   dapagliflozin  propanediol (FARXIGA ) 5 MG TABS tablet Take 1 tablet (5 mg total) by mouth daily.   glipiZIDE  (GLUCOTROL  XL) 10 MG 24 hr tablet Take 2 tablets (20 mg total) by mouth daily with breakfast.   hydrochlorothiazide  (MICROZIDE ) 12.5 MG capsule TAKE 1 CAPSULE BY MOUTH EVERY DAY   levothyroxine  (SYNTHROID ) 25 MCG tablet Take 1 tablet (25 mcg total) by mouth daily.   ondansetron  (ZOFRAN ) 4 MG tablet TAKE  1 TABLET BY MOUTH EVERY 8 HOURS AS NEEDED FOR NAUSEA AND VOMITING (Patient taking differently: Take 4 mg by mouth as needed.)   [DISCONTINUED] meclizine  (ANTIVERT ) 25 MG tablet Take 1 tablet (25 mg total) by mouth 3 (three) times daily as needed for dizziness. (Patient taking differently: Take 25 mg by mouth as needed for dizziness.)   [DISCONTINUED] tirzepatide  (MOUNJARO ) 7.5 MG/0.5ML Pen Inject 7.5 mg into the skin once a week.   No facility-administered medications prior to visit.    Review of Systems All negative Except see HPI       Objective    BP 108/80   Pulse 77   Resp 14   Ht 5' 6 (1.676 m)   Wt 169 lb 12.8 oz (77 kg)   SpO2 100%   BMI 27.41 kg/m     Physical Exam Vitals reviewed.  Constitutional:       General: She is not in acute distress.    Appearance: Normal appearance. She is well-developed. She is not diaphoretic.  HENT:     Head: Normocephalic and atraumatic.  Eyes:     General: No scleral icterus.    Conjunctiva/sclera: Conjunctivae normal.  Neck:     Thyroid: No thyromegaly.  Cardiovascular:     Rate and Rhythm: Normal rate and regular rhythm.     Pulses: Normal pulses.     Heart sounds: Normal heart sounds. No murmur heard. Pulmonary:     Effort: Pulmonary effort is normal. No respiratory distress.     Breath sounds: Normal breath sounds. No wheezing, rhonchi or rales.  Musculoskeletal:     Cervical back: Neck supple.     Right lower leg: No edema.     Left lower leg: No edema.  Lymphadenopathy:     Cervical: No cervical adenopathy.  Skin:    General: Skin is warm and dry.     Findings: No rash.  Neurological:     Mental Status: She is alert and oriented to person, place, and time. Mental status is at baseline.  Psychiatric:        Mood and Affect: Mood normal.        Behavior: Behavior normal.      No results found for any visits on 06/11/24.      Assessment & Plan Type 2 diabetes mellitus in the setting of morbid obesity Chronic and stable Diabetes well-controlled with current regimen. A1c normal. No hypoglycemia. Weight loss of 65 pounds noted. Discussed medication side effects and emphasized balanced diet and exercise. Advised against rapid weight loss and discussed insurance impact on medication access. - Continue current medication regimen including Mounjaro  7.5 mg. - Maintain a balanced diet and regular exercise. - Monitor for hypoglycemic symptoms and keep snacks available. - Schedule follow-up in three months.  Benign paroxysmal vertigo Intermittent vertigo, no recent episodes. Discussed Epley maneuver for prevention and importance of acute appointments if symptoms occur. - Prescribed meclizine  10-15 tablets for acute episodes. - Provided  instructions for the Epley maneuver. - Schedule acute appointment if symptoms occur.  Hypertension Chronic and Well-controlled with current regimen. IN the setting of obesity / DMII Blood pressure normal. - Continue current antihypertensive medications: amlodipine  5 mg and hydrochlorothiazide  12.5 mg. Continue lifestyle modifications Will follow-up  Hypothyroidism Chronic Well-managed with current regimen. Recent thyroid function tests normal. - Continue current thyroid medication: levothyroxine  25 mcg. Will follow-up  Hyperlipidemia Chronic and stable LDL 90 Continue atorvastatin  80mg  Continue low cholesterol diet and regular exercise Will follow-up  No  orders of the defined types were placed in this encounter.   Return in about 3 months (around 09/11/2024) for chronic disease f/u.   The patient was advised to call back or seek an in-person evaluation if the symptoms worsen or if the condition fails to improve as anticipated.  I discussed the assessment and treatment plan with the patient. The patient was provided an opportunity to ask questions and all were answered. The patient agreed with the plan and demonstrated an understanding of the instructions.  I, Sayla Golonka, PA-C have reviewed all documentation for this visit. The documentation on 06/11/2024  for the exam, diagnosis, procedures, and orders are all accurate and complete.  Jolynn Spencer, [facility], MMS [facility] [phone] (phone) [phone] (fax)  [facility]

## 2024-06-28 ENCOUNTER — Other Ambulatory Visit: Payer: Self-pay | Admitting: Physician Assistant

## 2024-09-11 ENCOUNTER — Ambulatory Visit: Admitting: Physician Assistant
